# Patient Record
Sex: Female | Born: 1940 | Race: Black or African American | Hispanic: No | Smoking: Former smoker
Health system: Southern US, Community
[De-identification: ages and names within clinical notes are randomized; demographics above are authoritative.]

## PROBLEM LIST (undated history)

## (undated) DIAGNOSIS — M898X9 Other specified disorders of bone, unspecified site: Secondary | ICD-10-CM

## (undated) DIAGNOSIS — Z9889 Other specified postprocedural states: Secondary | ICD-10-CM

## (undated) DIAGNOSIS — R0602 Shortness of breath: Secondary | ICD-10-CM

## (undated) DIAGNOSIS — D631 Anemia in chronic kidney disease: Secondary | ICD-10-CM

## (undated) DIAGNOSIS — K222 Esophageal obstruction: Secondary | ICD-10-CM

## (undated) DIAGNOSIS — I10 Essential (primary) hypertension: Secondary | ICD-10-CM

## (undated) DIAGNOSIS — K219 Gastro-esophageal reflux disease without esophagitis: Secondary | ICD-10-CM

## (undated) DIAGNOSIS — K529 Noninfective gastroenteritis and colitis, unspecified: Secondary | ICD-10-CM

## (undated) DIAGNOSIS — K5792 Diverticulitis of intestine, part unspecified, without perforation or abscess without bleeding: Secondary | ICD-10-CM

## (undated) DIAGNOSIS — M549 Dorsalgia, unspecified: Secondary | ICD-10-CM

## (undated) DIAGNOSIS — G40909 Epilepsy, unspecified, not intractable, without status epilepticus: Secondary | ICD-10-CM

## (undated) DIAGNOSIS — G8929 Other chronic pain: Secondary | ICD-10-CM

## (undated) DIAGNOSIS — E785 Hyperlipidemia, unspecified: Secondary | ICD-10-CM

## (undated) DIAGNOSIS — R55 Syncope and collapse: Secondary | ICD-10-CM

## (undated) DIAGNOSIS — C801 Malignant (primary) neoplasm, unspecified: Secondary | ICD-10-CM

## (undated) DIAGNOSIS — Z9289 Personal history of other medical treatment: Secondary | ICD-10-CM

## (undated) DIAGNOSIS — N039 Chronic nephritic syndrome with unspecified morphologic changes: Secondary | ICD-10-CM

## (undated) HISTORY — DX: Malignant (primary) neoplasm, unspecified: C80.1

## (undated) HISTORY — DX: Syncope and collapse: R55

## (undated) HISTORY — DX: Other specified disorders of bone, unspecified site: M89.8X9

## (undated) HISTORY — DX: Anemia in chronic kidney disease: D63.1

## (undated) HISTORY — DX: Essential (primary) hypertension: I10

## (undated) HISTORY — DX: Chronic nephritic syndrome with unspecified morphologic changes: N03.9

## (undated) HISTORY — DX: Personal history of other medical treatment: Z92.89

## (undated) HISTORY — DX: Gastro-esophageal reflux disease without esophagitis: K21.9

## (undated) HISTORY — PX: ABDOMINAL HYSTERECTOMY: SHX81

## (undated) HISTORY — DX: Epilepsy, unspecified, not intractable, without status epilepticus: G40.909

## (undated) HISTORY — DX: Other chronic pain: G89.29

## (undated) HISTORY — DX: Esophageal obstruction: K22.2

## (undated) HISTORY — DX: Diverticulitis of intestine, part unspecified, without perforation or abscess without bleeding: K57.92

## (undated) HISTORY — DX: Noninfective gastroenteritis and colitis, unspecified: K52.9

## (undated) HISTORY — DX: Dorsalgia, unspecified: M54.9

## (undated) HISTORY — DX: Other specified postprocedural states: Z98.890

## (undated) HISTORY — DX: Hyperlipidemia, unspecified: E78.5

---

## 2006-03-30 ENCOUNTER — Ambulatory Visit: Payer: Self-pay | Admitting: Gastroenterology

## 2006-04-20 ENCOUNTER — Ambulatory Visit: Payer: Self-pay | Admitting: Gastroenterology

## 2007-03-14 ENCOUNTER — Ambulatory Visit: Payer: Self-pay | Admitting: Vascular Surgery

## 2007-03-15 DIAGNOSIS — Z9889 Other specified postprocedural states: Secondary | ICD-10-CM

## 2007-03-15 HISTORY — DX: Other specified postprocedural states: Z98.890

## 2007-03-15 HISTORY — PX: CAROTID ENDARTERECTOMY: SUR193

## 2007-03-22 ENCOUNTER — Ambulatory Visit: Payer: Self-pay | Admitting: Vascular Surgery

## 2007-03-22 ENCOUNTER — Encounter: Payer: Self-pay | Admitting: Vascular Surgery

## 2007-03-22 ENCOUNTER — Inpatient Hospital Stay (HOSPITAL_COMMUNITY): Admission: RE | Admit: 2007-03-22 | Discharge: 2007-03-23 | Payer: Self-pay | Admitting: Vascular Surgery

## 2007-04-04 ENCOUNTER — Ambulatory Visit: Payer: Self-pay | Admitting: Vascular Surgery

## 2007-10-10 ENCOUNTER — Ambulatory Visit: Payer: Self-pay | Admitting: Vascular Surgery

## 2008-04-01 ENCOUNTER — Encounter: Payer: Self-pay | Admitting: Gastroenterology

## 2008-04-23 ENCOUNTER — Encounter: Payer: Self-pay | Admitting: Gastroenterology

## 2008-05-21 ENCOUNTER — Encounter: Payer: Self-pay | Admitting: Gastroenterology

## 2008-06-11 ENCOUNTER — Encounter: Payer: Self-pay | Admitting: Gastroenterology

## 2008-06-14 DIAGNOSIS — C801 Malignant (primary) neoplasm, unspecified: Secondary | ICD-10-CM

## 2008-06-14 HISTORY — PX: OTHER SURGICAL HISTORY: SHX169

## 2008-06-14 HISTORY — DX: Malignant (primary) neoplasm, unspecified: C80.1

## 2008-07-02 ENCOUNTER — Encounter: Payer: Self-pay | Admitting: Gastroenterology

## 2008-07-18 ENCOUNTER — Encounter: Payer: Self-pay | Admitting: Gastroenterology

## 2008-08-13 ENCOUNTER — Encounter: Payer: Self-pay | Admitting: Gastroenterology

## 2008-10-08 ENCOUNTER — Ambulatory Visit: Payer: Self-pay | Admitting: Cardiology

## 2008-10-08 DIAGNOSIS — R0602 Shortness of breath: Secondary | ICD-10-CM

## 2008-10-08 DIAGNOSIS — R011 Cardiac murmur, unspecified: Secondary | ICD-10-CM | POA: Insufficient documentation

## 2008-10-08 DIAGNOSIS — R072 Precordial pain: Secondary | ICD-10-CM | POA: Insufficient documentation

## 2008-10-08 DIAGNOSIS — I10 Essential (primary) hypertension: Secondary | ICD-10-CM | POA: Insufficient documentation

## 2008-10-08 DIAGNOSIS — R55 Syncope and collapse: Secondary | ICD-10-CM

## 2008-10-08 DIAGNOSIS — E785 Hyperlipidemia, unspecified: Secondary | ICD-10-CM

## 2008-10-09 ENCOUNTER — Telehealth: Payer: Self-pay | Admitting: Cardiology

## 2008-10-21 ENCOUNTER — Telehealth (INDEPENDENT_AMBULATORY_CARE_PROVIDER_SITE_OTHER): Payer: Self-pay | Admitting: Radiology

## 2008-10-22 ENCOUNTER — Ambulatory Visit: Payer: Self-pay

## 2008-10-22 ENCOUNTER — Encounter (INDEPENDENT_AMBULATORY_CARE_PROVIDER_SITE_OTHER): Payer: Self-pay | Admitting: General Surgery

## 2008-10-22 ENCOUNTER — Encounter: Payer: Self-pay | Admitting: Cardiology

## 2008-10-24 ENCOUNTER — Encounter: Payer: Self-pay | Admitting: Gastroenterology

## 2008-10-24 ENCOUNTER — Encounter: Payer: Self-pay | Admitting: Cardiology

## 2008-10-28 ENCOUNTER — Ambulatory Visit: Payer: Self-pay | Admitting: Cardiology

## 2008-10-28 DIAGNOSIS — R42 Dizziness and giddiness: Secondary | ICD-10-CM

## 2008-11-12 ENCOUNTER — Telehealth: Payer: Self-pay | Admitting: Cardiology

## 2008-11-15 ENCOUNTER — Telehealth: Payer: Self-pay | Admitting: Cardiology

## 2008-11-15 ENCOUNTER — Encounter: Payer: Self-pay | Admitting: Cardiology

## 2008-11-18 ENCOUNTER — Encounter: Payer: Self-pay | Admitting: Cardiology

## 2008-11-18 ENCOUNTER — Telehealth: Payer: Self-pay | Admitting: Cardiology

## 2008-11-19 ENCOUNTER — Telehealth (INDEPENDENT_AMBULATORY_CARE_PROVIDER_SITE_OTHER): Payer: Self-pay | Admitting: *Deleted

## 2008-11-20 ENCOUNTER — Encounter: Payer: Self-pay | Admitting: Cardiology

## 2008-11-21 ENCOUNTER — Encounter: Payer: Self-pay | Admitting: Gastroenterology

## 2008-11-28 ENCOUNTER — Ambulatory Visit: Payer: Self-pay | Admitting: Cardiology

## 2008-12-17 ENCOUNTER — Ambulatory Visit: Payer: Self-pay | Admitting: Cardiology

## 2008-12-18 LAB — CONVERTED CEMR LAB
ALT: 46 units/L — ABNORMAL HIGH (ref 0–35)
Cholesterol: 160 mg/dL (ref 0–200)
HDL: 51.7 mg/dL (ref >39.00)
Total Protein: 6.7 g/dL (ref 6.0–8.3)
VLDL: 21 mg/dL (ref 0.0–40.0)

## 2008-12-19 ENCOUNTER — Encounter: Payer: Self-pay | Admitting: Gastroenterology

## 2009-01-12 DIAGNOSIS — Z9289 Personal history of other medical treatment: Secondary | ICD-10-CM

## 2009-01-12 HISTORY — DX: Personal history of other medical treatment: Z92.89

## 2009-01-28 ENCOUNTER — Ambulatory Visit: Payer: Self-pay | Admitting: Cardiology

## 2009-01-28 DIAGNOSIS — E639 Nutritional deficiency, unspecified: Secondary | ICD-10-CM | POA: Insufficient documentation

## 2009-01-28 DIAGNOSIS — I43 Cardiomyopathy in diseases classified elsewhere: Secondary | ICD-10-CM

## 2009-01-28 DIAGNOSIS — E889 Metabolic disorder, unspecified: Secondary | ICD-10-CM

## 2009-01-28 LAB — CONVERTED CEMR LAB
Alpha-2-Globulin: 12.1 % — ABNORMAL HIGH (ref 7.1–11.8)
Beta Globulin: 5.9 % (ref 4.7–7.2)
Beta, Urine: DETECTED % — AB
Calcium: 9.8 mg/dL (ref 8.4–10.5)
Creatinine, Ser: 1 mg/dL (ref 0.4–1.2)
Free Kappa Lt Chains,Ur: 2.09 mg/dL — ABNORMAL HIGH (ref 0.04–1.51)
Free Lambda Lt Chains,Ur: 19.9 mg/dL — ABNORMAL HIGH (ref 0.08–1.01)
GFR calc non Af Amer: 71.01 mL/min (ref >60)
Gamma Globulin, Urine: DETECTED % — AB
Gamma Globulin: 9.9 % — ABNORMAL LOW (ref 11.1–18.8)
Glucose, Bld: 86 mg/dL (ref 70–99)
IgG (Immunoglobin G), Serum: 841 mg/dL (ref 694–1618)
Pro B Natriuretic peptide (BNP): 119 pg/mL — ABNORMAL HIGH (ref 0.0–100.0)
Sodium: 143 meq/L (ref 135–145)
Total Protein, Serum Electrophoresis: 7.4 g/dL (ref 6.0–8.3)

## 2009-01-29 ENCOUNTER — Ambulatory Visit: Payer: Self-pay | Admitting: Cardiology

## 2009-01-29 ENCOUNTER — Ambulatory Visit: Payer: Self-pay | Admitting: Internal Medicine

## 2009-01-29 ENCOUNTER — Ambulatory Visit (HOSPITAL_COMMUNITY): Admission: RE | Admit: 2009-01-29 | Discharge: 2009-01-29 | Payer: Self-pay | Admitting: Cardiology

## 2009-01-30 ENCOUNTER — Encounter: Payer: Self-pay | Admitting: Gastroenterology

## 2009-01-31 ENCOUNTER — Ambulatory Visit: Payer: Self-pay | Admitting: Internal Medicine

## 2009-01-31 ENCOUNTER — Ambulatory Visit (HOSPITAL_COMMUNITY): Admission: RE | Admit: 2009-01-31 | Discharge: 2009-01-31 | Payer: Self-pay | Admitting: Internal Medicine

## 2009-02-01 LAB — CONVERTED CEMR LAB
Basophils Absolute: 0 10*3/uL (ref 0.0–0.1)
Hemoglobin: 10.2 g/dL — ABNORMAL LOW (ref 12.0–15.0)
INR: 1.2 — ABNORMAL HIGH (ref 0.8–1.0)
Lymphocytes Relative: 27.5 % (ref 12.0–46.0)
Monocytes Relative: 5.9 % (ref 3.0–12.0)
Neutro Abs: 3.8 10*3/uL (ref 1.4–7.7)
Neutrophils Relative %: 65.1 % (ref 43.0–77.0)
Prothrombin Time: 12 s — ABNORMAL HIGH (ref 9.1–11.7)
RBC: 3.79 M/uL — ABNORMAL LOW (ref 3.87–5.11)
RDW: 16 % — ABNORMAL HIGH (ref 11.5–14.6)
aPTT: 25.8 s (ref 21.7–28.8)

## 2009-02-03 ENCOUNTER — Telehealth: Payer: Self-pay | Admitting: Cardiology

## 2009-02-05 ENCOUNTER — Ambulatory Visit: Payer: Self-pay | Admitting: Internal Medicine

## 2009-02-05 DIAGNOSIS — I428 Other cardiomyopathies: Secondary | ICD-10-CM

## 2009-02-06 ENCOUNTER — Encounter: Payer: Self-pay | Admitting: Cardiology

## 2009-02-11 ENCOUNTER — Ambulatory Visit (HOSPITAL_COMMUNITY): Admission: RE | Admit: 2009-02-11 | Discharge: 2009-02-11 | Payer: Self-pay | Admitting: Cardiology

## 2009-02-11 ENCOUNTER — Ambulatory Visit: Payer: Self-pay

## 2009-02-11 ENCOUNTER — Encounter: Payer: Self-pay | Admitting: Internal Medicine

## 2009-02-12 DIAGNOSIS — D631 Anemia in chronic kidney disease: Secondary | ICD-10-CM

## 2009-02-12 HISTORY — DX: Anemia in chronic kidney disease: D63.1

## 2009-02-13 ENCOUNTER — Ambulatory Visit: Payer: Self-pay | Admitting: Internal Medicine

## 2009-02-13 ENCOUNTER — Inpatient Hospital Stay (HOSPITAL_COMMUNITY): Admission: EM | Admit: 2009-02-13 | Discharge: 2009-02-17 | Payer: Self-pay | Admitting: Internal Medicine

## 2009-02-13 ENCOUNTER — Ambulatory Visit: Payer: Self-pay | Admitting: Cardiology

## 2009-02-18 ENCOUNTER — Encounter: Payer: Self-pay | Admitting: Cardiology

## 2009-02-24 ENCOUNTER — Ambulatory Visit: Payer: Self-pay | Admitting: Cardiology

## 2009-02-25 ENCOUNTER — Encounter: Payer: Self-pay | Admitting: Cardiology

## 2009-03-11 ENCOUNTER — Encounter: Payer: Self-pay | Admitting: Cardiology

## 2009-03-14 HISTORY — PX: COLONOSCOPY: SHX174

## 2009-03-24 ENCOUNTER — Encounter: Payer: Self-pay | Admitting: Cardiology

## 2009-04-24 ENCOUNTER — Encounter: Payer: Self-pay | Admitting: Cardiology

## 2009-04-28 ENCOUNTER — Ambulatory Visit: Payer: Self-pay | Admitting: Cardiology

## 2009-06-14 DIAGNOSIS — K5792 Diverticulitis of intestine, part unspecified, without perforation or abscess without bleeding: Secondary | ICD-10-CM

## 2009-06-14 HISTORY — PX: PARTIAL COLECTOMY: SHX5273

## 2009-06-14 HISTORY — DX: Diverticulitis of intestine, part unspecified, without perforation or abscess without bleeding: K57.92

## 2009-06-25 ENCOUNTER — Telehealth: Payer: Self-pay | Admitting: Cardiology

## 2009-07-12 ENCOUNTER — Ambulatory Visit: Payer: Self-pay | Admitting: Cardiology

## 2009-08-25 ENCOUNTER — Encounter: Payer: Self-pay | Admitting: Cardiology

## 2009-09-08 ENCOUNTER — Ambulatory Visit: Payer: Self-pay

## 2009-09-08 ENCOUNTER — Ambulatory Visit: Payer: Self-pay | Admitting: Cardiology

## 2009-09-08 ENCOUNTER — Encounter: Payer: Self-pay | Admitting: Internal Medicine

## 2009-09-08 DIAGNOSIS — R609 Edema, unspecified: Secondary | ICD-10-CM

## 2009-09-10 ENCOUNTER — Encounter: Payer: Self-pay | Admitting: Internal Medicine

## 2009-09-10 ENCOUNTER — Telehealth (INDEPENDENT_AMBULATORY_CARE_PROVIDER_SITE_OTHER): Payer: Self-pay | Admitting: *Deleted

## 2009-09-10 ENCOUNTER — Encounter: Payer: Self-pay | Admitting: Cardiology

## 2009-09-11 ENCOUNTER — Telehealth: Payer: Self-pay | Admitting: Cardiology

## 2009-09-19 ENCOUNTER — Ambulatory Visit: Payer: Self-pay

## 2009-09-19 ENCOUNTER — Ambulatory Visit: Payer: Self-pay | Admitting: Internal Medicine

## 2009-09-19 ENCOUNTER — Ambulatory Visit (HOSPITAL_COMMUNITY): Admission: RE | Admit: 2009-09-19 | Discharge: 2009-09-19 | Payer: Self-pay | Admitting: Cardiology

## 2009-09-19 ENCOUNTER — Encounter: Payer: Self-pay | Admitting: Cardiology

## 2009-09-19 ENCOUNTER — Ambulatory Visit: Payer: Self-pay | Admitting: Cardiology

## 2009-09-19 ENCOUNTER — Encounter (INDEPENDENT_AMBULATORY_CARE_PROVIDER_SITE_OTHER): Payer: Self-pay | Admitting: *Deleted

## 2009-09-22 LAB — CONVERTED CEMR LAB
AST: 19 units/L (ref 0–37)
Albumin: 3.7 g/dL (ref 3.5–5.2)
Alkaline Phosphatase: 87 units/L (ref 39–117)
BUN: 17 mg/dL (ref 6–23)
Basophils Relative: 0.4 % (ref 0.0–3.0)
Bilirubin, Direct: 0.1 mg/dL (ref 0.0–0.3)
Calcium: 8.5 mg/dL (ref 8.4–10.5)
Chloride: 106 meq/L (ref 96–112)
Cholesterol: 211 mg/dL — ABNORMAL HIGH (ref 0–200)
Creatinine, Ser: 0.8 mg/dL (ref 0.4–1.2)
Eosinophils Absolute: 0 10*3/uL (ref 0.0–0.7)
Eosinophils Relative: 0.5 % (ref 0.0–5.0)
INR: 1.1 — ABNORMAL HIGH (ref 0.8–1.0)
Lymphocytes Relative: 26.4 % (ref 12.0–46.0)
MCHC: 33.2 g/dL (ref 30.0–36.0)
MCV: 83 fL (ref 78.0–100.0)
Monocytes Absolute: 0.5 10*3/uL (ref 0.1–1.0)
Neutrophils Relative %: 63.4 % (ref 43.0–77.0)
Platelets: 251 10*3/uL (ref 150.0–400.0)
Prothrombin Time: 11.6 s (ref 9.1–11.7)
RBC: 4.88 M/uL (ref 3.87–5.11)
Total Protein: 6.1 g/dL (ref 6.0–8.3)
WBC: 5.8 10*3/uL (ref 4.5–10.5)
aPTT: 22.3 s (ref 21.7–28.8)

## 2009-09-25 ENCOUNTER — Encounter: Payer: Self-pay | Admitting: Cardiology

## 2009-10-21 ENCOUNTER — Ambulatory Visit: Payer: Self-pay | Admitting: Cardiology

## 2009-10-21 LAB — CONVERTED CEMR LAB
BUN: 11 mg/dL (ref 6–23)
Calcium: 9.2 mg/dL (ref 8.4–10.5)
Creatinine, Ser: 0.7 mg/dL (ref 0.4–1.2)
GFR calc non Af Amer: 112.48 mL/min (ref >60)
Glucose, Bld: 37 mg/dL — CL (ref 70–99)

## 2009-12-18 ENCOUNTER — Ambulatory Visit: Payer: Self-pay | Admitting: Cardiology

## 2009-12-20 ENCOUNTER — Inpatient Hospital Stay (HOSPITAL_COMMUNITY): Admission: EM | Admit: 2009-12-20 | Discharge: 2010-01-02 | Payer: Self-pay | Admitting: Pulmonary Disease

## 2009-12-20 ENCOUNTER — Ambulatory Visit: Payer: Self-pay | Admitting: Pulmonary Disease

## 2009-12-31 ENCOUNTER — Ambulatory Visit: Payer: Self-pay | Admitting: Physical Medicine & Rehabilitation

## 2010-01-02 ENCOUNTER — Inpatient Hospital Stay (HOSPITAL_COMMUNITY)
Admission: RE | Admit: 2010-01-02 | Discharge: 2010-01-19 | Payer: Self-pay | Admitting: Physical Medicine & Rehabilitation

## 2010-01-10 ENCOUNTER — Ambulatory Visit: Payer: Self-pay | Admitting: Physical Medicine & Rehabilitation

## 2010-01-27 ENCOUNTER — Telehealth: Payer: Self-pay | Admitting: Cardiology

## 2010-02-10 ENCOUNTER — Inpatient Hospital Stay (HOSPITAL_COMMUNITY): Admission: EM | Admit: 2010-02-10 | Discharge: 2010-02-19 | Payer: Self-pay | Admitting: Emergency Medicine

## 2010-02-13 ENCOUNTER — Ambulatory Visit: Payer: Self-pay | Admitting: Internal Medicine

## 2010-02-27 ENCOUNTER — Encounter (INDEPENDENT_AMBULATORY_CARE_PROVIDER_SITE_OTHER): Payer: Self-pay | Admitting: *Deleted

## 2010-03-28 ENCOUNTER — Emergency Department (HOSPITAL_COMMUNITY): Admission: EM | Admit: 2010-03-28 | Discharge: 2010-03-28 | Payer: Self-pay | Admitting: Emergency Medicine

## 2010-04-19 ENCOUNTER — Emergency Department (HOSPITAL_COMMUNITY): Admission: EM | Admit: 2010-04-19 | Discharge: 2010-04-19 | Payer: Self-pay | Admitting: Emergency Medicine

## 2010-04-27 ENCOUNTER — Encounter: Payer: Self-pay | Admitting: Cardiology

## 2010-05-04 ENCOUNTER — Ambulatory Visit: Payer: Self-pay | Admitting: Internal Medicine

## 2010-05-14 DIAGNOSIS — K222 Esophageal obstruction: Secondary | ICD-10-CM

## 2010-05-14 HISTORY — DX: Esophageal obstruction: K22.2

## 2010-05-20 ENCOUNTER — Ambulatory Visit (HOSPITAL_COMMUNITY)
Admission: RE | Admit: 2010-05-20 | Discharge: 2010-05-20 | Payer: Self-pay | Source: Home / Self Care | Attending: Internal Medicine | Admitting: Internal Medicine

## 2010-05-20 ENCOUNTER — Ambulatory Visit: Payer: Self-pay | Admitting: Internal Medicine

## 2010-05-20 HISTORY — PX: UPPER GASTROINTESTINAL ENDOSCOPY: SHX188

## 2010-06-10 ENCOUNTER — Encounter: Payer: Self-pay | Admitting: Cardiology

## 2010-06-29 ENCOUNTER — Ambulatory Visit
Admission: RE | Admit: 2010-06-29 | Discharge: 2010-06-29 | Payer: Self-pay | Source: Home / Self Care | Attending: Cardiology | Admitting: Cardiology

## 2010-06-29 ENCOUNTER — Encounter: Payer: Self-pay | Admitting: Cardiology

## 2010-06-29 ENCOUNTER — Other Ambulatory Visit: Payer: Self-pay | Admitting: Cardiology

## 2010-06-29 LAB — BASIC METABOLIC PANEL
BUN: 14 mg/dL (ref 6–23)
CO2: 27 mEq/L (ref 19–32)
Calcium: 9.5 mg/dL (ref 8.4–10.5)
Chloride: 100 mEq/L (ref 96–112)
Creatinine, Ser: 0.9 mg/dL (ref 0.4–1.2)
GFR: 76.89 mL/min (ref >60.00)
Glucose, Bld: 108 mg/dL — ABNORMAL HIGH (ref 70–99)
Potassium: 4.5 mEq/L (ref 3.5–5.1)
Sodium: 136 mEq/L (ref 135–145)

## 2010-06-29 LAB — LIPID PANEL
Cholesterol: 267 mg/dL — ABNORMAL HIGH (ref 0–200)
HDL: 54.6 mg/dL (ref >39.00)
Total CHOL/HDL Ratio: 5
Triglycerides: 257 mg/dL — ABNORMAL HIGH (ref 0.0–149.0)
VLDL: 51.4 mg/dL — ABNORMAL HIGH (ref 0.0–40.0)

## 2010-06-29 LAB — HEPATIC FUNCTION PANEL
ALT: 13 U/L (ref 0–35)
AST: 21 U/L (ref 0–37)
Albumin: 4 g/dL (ref 3.5–5.2)
Alkaline Phosphatase: 130 U/L — ABNORMAL HIGH (ref 39–117)
Bilirubin, Direct: 0.1 mg/dL (ref 0.0–0.3)
Total Bilirubin: 0.7 mg/dL (ref 0.3–1.2)
Total Protein: 7.3 g/dL (ref 6.0–8.3)

## 2010-06-29 LAB — LDL CHOLESTEROL, DIRECT: Direct LDL: 155.9 mg/dL

## 2010-06-29 LAB — BRAIN NATRIURETIC PEPTIDE: Pro B Natriuretic peptide (BNP): 47.5 pg/mL (ref 0.0–100.0)

## 2010-07-09 ENCOUNTER — Telehealth: Payer: Self-pay | Admitting: Cardiology

## 2010-07-14 NOTE — Letter (Signed)
Summary: [employer] - BP Report  [employer] - BP Report   Imported By: Marylou Mccoy 11/13/2009 10:55:51  _____________________________________________________________________  External Attachment:    Type:   Image     Comment:   External Document

## 2010-07-14 NOTE — Progress Notes (Signed)
  Walk in Patient Form Recieved " pt left Labs from [facility]' for [facility] to review forwarded to [facility]  September 10, 2009 10:48 AM

## 2010-07-14 NOTE — Letter (Signed)
Summary: Appointment - Missed  [facility], Main Office  [address]   [place], [place] [address]   Phone: [phone]  Fax: [phone]     February 27, 2010 MRN: [id number]   SHAWN DANNENBERG [address] [place], [place]  [address]   Dear Ms. Morales,  Our records indicate you missed your appointment on  02/23/10 with Dr. Shirlee Latch .It is very important that we reach you to reschedule this appointment. We look forward to participating in your health care needs. Please contact us at the number listed above at your earliest convenience to reschedule this appointment.     Sincerely,  Artist

## 2010-07-14 NOTE — Assessment & Plan Note (Signed)
Summary: 6wk f/u sl   Referring Provider:  Marca Ancona, MD Primary Provider:  Paulene Floor  CC:  6 week follow up.  Pt feeling well.  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  She underwent treatment with Velcade and dexamethasone for systemic amyloidosis.  Currently she is only getting dexamethasone.  Per the patient, her light chain count has been decreasing with treatment.  She went into [facility] on 07/12/09 with what sounds like diverticulitis and a resulting perforation.  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  She was in rehab for a while but is now at home.  She also had a bout with severe diarrhea.  She was seen by GI, and it was determined that she had secretory diarrhea.  She was started on octreotide with much improvement.  Repeat echo in 4/11 showed severe LV hypertrophy with stable LV mid-cavity gradient with valsalva.    Patient has been doing better recently.  She has had no falls/syncope and has minimal lightheadedness.  She stands up slowly to minimize orthostasis.  BP is more stable, running 120s-130s systolic at home.  She says that there are plans to reverse her colostomy in the near future.  No chest pain.  No dyspnea when walking on flat ground.    Labs (7/10): LDL 87, HDL 52, ALT 46, AST 33 Labs (8/10): K 4.4, creatinine 1.0, BNP 119, HCT 30.6, SPEP negative, UPEP positive for monoclonal lambda light chains.  Labs (9/10): creatinine 0.93, C diff negative  Labs (4/11): HDL 58, LDL 127, K 3.5, creatinine 0.8  Current Medications (verified): 1)  Folic Acid 1 Mg Tabs (Folic Acid) .... Take 1 Tablet Once Daily 2)  Zocor 80 Mg Tabs (Simvastatin) .... Take One Tablet Once Daily 3)  Toprol Xl 50 Mg Xr24h-Tab (Metoprolol Succinate) .... 1/2 Daily 4)  Vitamin D (Ergocalciferol) 50000 Unit Caps (Ergocalciferol) .... Every Week 5)  Hydroxyzine Hcl 10 Mg Tabs (Hydroxyzine  Hcl) .... Take 1 Tablet By Mouth Two Times A Day 6)  Lisinopril 10 Mg Tabs (Lisinopril) .Marland Kitchen.. 1 Tablet Once Daily 7)  Prochlorperazine Maleate 10 Mg Tabs (Prochlorperazine Maleate) .... Take One Tablet Before Meals 8)  Dexamethasone 4 Mg Tabs (Dexamethasone) .... Take 10 Tabs Every Tuesday.  5 Tabs Am, 5 Tabs Pm 9)  Aspirin 81 Mg Tbec (Aspirin) .... Take One Tablet By Mouth Daily 10)  Octreotide Acetate 1000 Mcg/ml Soln (Octreotide Acetate) .... Use Every 12 Hours  Allergies (verified): No Known Drug Allergies  Past History:  Past Medical History: 1.  Left CEA (10/08).  Most recent carotid dopplers 6/10 with 50-69% RICA stenosis, LICA ok s/p CEA.  2.  HTN 3.  GERD 4.  Hyperlipidemia 5.  Anemia:  She has been on Aranesp for ? anemia of chronic kidney disease though renal function does not seem severely impaired.  She had repeat bone marrow biopsy by Dr. Cleone Slim 9/10. 6.  s/p hysterectomy 7.  Mid-cavity dynamic LV gradient:  Echo (5/10) with EF 65-70%, moderate LVH, mid-cavity obliteration, peak gradient to 54 mmHg with Valsalva.  Repeat echo (4/11) with EF 65-70%, severe concentric LVH, peak LV mid-cavity gradient reaching 45 mmHg with valsalva, grade I diastolic dysfunction, RV hypertrophy, small pericardial effusion. 8.  Adenosine myoview (5/10):  No evidence for ischemia or infarction.  9.  Episodes of syncope and presyncope.  - Patient has Medtronic loop recorder implanted 8/10.  No events so  far - May be due only to orthostasis 10. 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   - Cardiac MRI (8/10): EF 66%, mild to moderate concentric LVH, RV size and systolic function normal with no evidence for ARVC, trivial pericardial effusion.  On delayed enhancement, it was difficult to obtain the TI.  There was patchy mid-wall basal delayed enhancement.  There was partially circumferential subendocardial delayed enhancement in the mid to apical LV.  This was  suggestive of infiltrative cardiomyopathy versus possible HCM.  - SPEP negative.  UPEP positive for monoclonal lambda light chains.  Monoclonal light chains confirmed on testing by Dr. Cleone Slim.  - CT chest (8/10) to assess for sarcoidosis: no evidence by CT for pulmonary sarcoid.  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  11.  C. difficile colitis with orthostatic hypotension 12. Diverticulitis (1/11) with perforation and colostomy.  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   Family History: Reviewed history from 02/05/2009 and no changes required. Mother died suddenly with presumed MI at 86.  Father died suddenly with presumed MI at age 6.  Cousin died suddenly, ? age.   Social History: Reviewed history from 09/08/2009 and no changes required. Quit tobacco in 1980.  No ETOH or drugs.  Single, lives in [place], 1 child (daughter in [place]).  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   Review of Systems       All systems reviewed and negative except as per HPI.   Vital Signs:  Patient profile:   70 year old female Height:      63 inches Weight:      99 pounds BMI:     17.60 Pulse rate:   73 / minute Pulse rhythm:   regular BP sitting:   116 / 74  (left arm) Cuff size:   regular  Vitals Entered By: Judithe Modest CMA (Oct 21, 2009 10:41 AM)  Physical Exam  General:  Thin, no apparent distress Neck:  Neck supple, no JVD. No masses, thyromegaly or abnormal cervical nodes. Lungs:  Clear bilaterally to auscultation and percussion. Heart:  Non-displaced PMI, chest non-tender; regular rate and rhythm, S1, S2, 2/6 systolic crescendo-decrescendo early systolic murmur upper sternal border. No S3/S4.  Carotid upstroke normal, no bruit. Pedals normal pulses. 1+ left ankle edema.  Abdomen:  Bowel sounds positive; abdomen soft and non-tender without masses, organomegaly, or hernias noted. No hepatosplenomegaly. Extremities:  No clubbing or  cyanosis. Neurologic:  Alert and oriented x 3. Psych:  Normal affect.    ILR Following MD Hillis Range, MD DOI:  01/31/2009 Vendor:  [employer]tronic     Model Number:  0454     Serial Number [id number]        Impression & Recommendations:  Problem # 1:  CARDIOMYOPATHY (ICD-425.4) Patient has a cardiomyopathy with preserved LV systolic function.  Delayed enhancement on MRI suggested an infiltrative disease or hypertrophic cardiomyopathy rather than coronary disease, and she has had a negative myoview.  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  This was confirmed by testing at Dr. Bertha Stakes office as well.  CT chest was not suggestive of pulmonary sarcoidosis. Given the finding of monoclonal light chains and suggestive MRI findings, she is now being treated for amyloidosis by Dr. Cleone Slim.  She was on Velcade and dexamethasone and is now on dexamethasone alone. Recent echo showed stable LV mid-cavity gradient.  She seems to be  doing better in general.  - She does not appear significantly volume overloaded today.  No Lasix as she does not appear overloaded and this could worsen her mid-cavity gradient and orthostasis.  - Continue treatment for light chain amyloidosis per Dr. Cleone Slim.  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  Mid-cavity gradient on echo certainly would predispose to this.  She has had her loop recorder since 8/10 and has had no arrhythmic events despite multiple presyncopal episodes (same as the symptoms that precipitated placement of the loop recorder).  Symptoms are considerably improved with less orthostasis over the last few months.  We discussed removing the loop recorder, but given minimal risk will leave in place for now.   - Continue Toprol XL (may improve mid-cavity gradient) - Always stand up slowly while holding  onto something.  - Continue to wear graded compression stockings.    Problem # 3:  HYPERTENSION, UNSPECIFIED (ICD-401.9) BP is well-controlled. Continue Toprol XL and lisinopril.   Problem # 4:  HYPERLIPIDEMIA-MIXED (ICD-272.4) LDL is too high given known vascular disease.  Goal LDL < 70, will increase Crestor to 20 mg daily.   Patient Instructions: 1)  Your physician has recommended you make the following change in your medication:  2)  Stop Zocor(simvastatin) 3)  Start Crestor 20mg  daily 4)  Lab today--BMP  425.4 401.9 5)  Your physician recommends that you have a  FASTING lipid profile/liver profile/BMP in 2 months--you have the order-please fax results to [phone]  6)  Your physician recommends that you schedule a follow-up appointment in: 4 months with Dr Shirlee Latch. Prescriptions: CRESTOR 20 MG TABS (ROSUVASTATIN CALCIUM) one tablet daily  #30 x 6   Entered by:   Katina Dung, RN, BSN   Authorized by:   Marca Ancona, MD   Signed by:   Katina Dung, RN, BSN on 10/21/2009   Method used:   Electronically to        [facility] [phone]* (retail)       [address] [place], [place]  [address]       Ph: [phone] or [phone]       Fax: [phone]   RxID:   [id number]

## 2010-07-14 NOTE — Cardiovascular Report (Signed)
Summary: Office Visit   Office Visit   Imported By: Roderic Ovens 09/19/2009 13:27:47  _____________________________________________________________________  External Attachment:    Type:   Image     Comment:   External Document

## 2010-07-14 NOTE — Letter (Signed)
Summary: External Correspondence/ [facility]  External Correspondence/ [facility]   Imported By: Dorise Hiss 10/07/2009 09:32:36  _____________________________________________________________________  External Attachment:    Type:   Image     Comment:   External Document

## 2010-07-14 NOTE — Assessment & Plan Note (Signed)
Summary: 4 month rov/sl   Referring Provider:  Marca Ancona, MD Primary Provider:  Paulene Floor  CC:  sob.  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  She underwent treatment with Velcade and dexamethasone for systemic amyloidosis.  This has been complicated by probable treatment-related diarrhea.  Currently she is only getting dexamethasone.  Per the patient, her light chain count has been decreasing with treatment.  She has had a very difficult last few months.  She went into [facility] on 07/12/09 with what sounds like diverticulitis and a resulting perforation.  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  She was in rehab for a while but is now at home.  Weight is down 8 lbs since I last saw her.  She has continued to have a lot of fluctuations with her BP.  It has been as high as 198/96 but also has been very low (down to the 80s systolic).  Two weeks ago, she got lightheaded getting out of her car and fell.  She almost passed out but never completely lost consciousness.  She has been wearing her support hose. She is walking with a cane and does not have much exertional shortness of breath on flat ground.  She gets short of breath with a flight of steps.    We did orthostatics today in the office.  Her BP initially dropped significantly with standing but rose quickly back to baseline after a couple of minutes standing.   Interrogation of her loop recorder today showed no significant arrhythmic events.   Labs (7/10): LDL 87, HDL 52, ALT 46, AST 33 Labs (8/10): K 4.4, creatinine 1.0, BNP 119, HCT 30.6, SPEP negative, UPEP positive for monoclonal lambda light chains.  Labs (9/10): creatinine 0.93, C diff negative   Loop recorder interrogation: No significant events.  Nothing to correlate with the presyncopal episode 2 weeks ago.   Current Medications (verified): 1)  Folic Acid 1 Mg Tabs  (Folic Acid) .... Take 1 Tablet Once Daily 2)  Zocor 80 Mg Tabs (Simvastatin) .... Take One Tablet Once Daily 3)  Toprol Xl 50 Mg Xr24h-Tab (Metoprolol Succinate) .... 1/2 Daily 4)  Vitamin D (Ergocalciferol) 50000 Unit Caps (Ergocalciferol) .... Every Week 5)  Hydroxyzine Hcl 10 Mg Tabs (Hydroxyzine Hcl) .... Take 1 Tablet By Mouth Two Times A Day 6)  Lisinopril 10 Mg Tabs (Lisinopril) .... 1/2 Daily 7)  Prochlorperazine Maleate 10 Mg Tabs (Prochlorperazine Maleate) .... Take One Tablet Before Meals 8)  Dexamethasone 4 Mg Tabs (Dexamethasone) .... Take 10 Tabs Every Tuesday.  5 Tabs Am, 5 Tabs Pm  Allergies: No Known Drug Allergies  Past History:  Past Medical History: 1.  Left CEA (10/08).  Most recent carotid dopplers 6/10 with 50-69% RICA stenosis, LICA ok s/p CEA.  2.  HTN 3.  GERD 4.  Hyperlipidemia 5.  Anemia:  Pt has been worked up with colonoscopy and EGD.  Per the pt's report, the only thing found was diverticulosis.  She had a bone marrow biopsy in fall 2009 that per Dr. Bertha Stakes notes showed essentially normal marrow.  She has been on Aranesp for ? anemia of chronic kidney disease though renal function does not seem severely impaired.  She had repeat bone marrow biopsy by Dr. Cleone Slim 9/10. 6.  s/p hysterectomy 7.  Mid-cavity dynamic LV gradient:  Echo (5/10) with EF 65-70%, moderate LVH, mid-cavity obliteration, peak gradient to 54 mmHg  with Valsalva.  8.  Adenosine myoview (5/10):  No evidence for ischemia or infarction.  9.  Episodes of syncope and presyncope.  - Patient has Medtronic loop recorder implanted 8/10.  No events so far - May be due only to orthostasis 10. 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   - Cardiac MRI (8/10): EF 66%, mild to moderate concentric LVH, RV size and systolic function normal with no evidence for ARVC, trivial pericardial effusion.  On delayed enhancement, it was difficult to obtain the TI.  There was patchy  mid-wall basal delayed enhancement.  There was partially circumferential subendocardial delayed enhancement in the mid to apical LV.  This was suggestive of infiltrative cardiomyopathy versus possible HCM.  - SPEP negative.  UPEP positive for monoclonal lambda light chains.  Monoclonal light chains confirmed on testing by Dr. Cleone Slim.  - CT chest (8/10) to assess for sarcoidosis: no evidence by CT for pulmonary sarcoid.  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  11.  C. difficile colitis with orthostatic hypotension 12. Diverticulitis (1/11) with perforation and colostomy.   Family History: Reviewed history from 02/05/2009 and no changes required. Mother died suddenly with presumed MI at 18.  Father died suddenly with presumed MI at age 54.  Cousin died suddenly, ? age.   Social History: Quit tobacco in 1980.  No ETOH or drugs.  Single, lives in [place], 1 child (daughter in [place]).  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   Review of Systems       All systems reviewed and negative except as per HPI.   Vital Signs:  Patient profile:   70 year old female Height:      63 inches Weight:      96 pounds BMI:     17.07 Pulse rate:   72 / minute Pulse (ortho):   72 / minute Resp:     18 per minute BP sitting:   160 / 80  (left arm) BP standing:   148 / 81  Vitals Entered By: Kem Parkinson (September 08, 2009 1:57 PM)  Serial Vital Signs/Assessments:  Time      Position  BP       Pulse  Resp  Temp     By           Lying RA  160/77   71                    Kimalexis Barnes           Sitting   131/70   71                    Kimalexis Barnes           Standing  148/81   72                    Kimalexis Barnes   Physical Exam  General:  Thin, frail-appearing Neck:  Neck supple, no JVD. No masses, thyromegaly or abnormal cervical nodes. Lungs:  Clear bilaterally to auscultation and percussion. Heart:  Non-displaced PMI, chest non-tender; regular  rate and rhythm, S1, S2, 3/6 systolic crescendo-decrescendo early systolic murmur upper sternal border, more holosystolic lower sternal border. No S3/S4.  Carotid upstroke normal, no bruit. Pedals normal pulses. 2+ edema 1/2 up lower leg on left, 1+ ankle edema on right (asymmetric).  Abdomen:  Bowel sounds positive; abdomen soft and non-tender without  masses, organomegaly, or hernias noted. No hepatosplenomegaly. Extremities:  No clubbing or cyanosis. Neurologic:  Alert and oriented x 3. Psych:  Normal affect.    ILR Following MD Hillis Range, MD DOI:  01/31/2009 Vendor:  Medtronic     Model Number:  [id number]     Serial Number [id number]        Impression & Recommendations:  Problem # 1:  CARDIOMYOPATHY (ICD-425.4) Patient has a cardiomyopathy with preserved LV systolic function.  Delayed enhancement on MRI suggested an infiltrative disease or hypertrophic cardiomyopathy rather than coronary disease, and she has had a negative myoview.  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  This was confirmed by testing at Dr. Bertha Stakes office as well.  CT chest was not suggestive of pulmonary sarcoidosis.  She does not appear significantly volume overloaded today.  No Lasix as she does not appear overloaded and this could worsen her mid-cavity gradient and orthostasis.  Given the finding of monoclonal light chains and suggestive MRI findings, she is now being treated for amyloidosis by Dr. Cleone Slim.  She was on Velcade and dexamethasone and is now on dexamethasone alone.  - Continue treatment for light chain amyloidosis per Dr. Cleone Slim. - Will continue Toprol XL at current dose, this may help with mid-cavity gradient and orthostasis.  - Repeat echocardiogram - Check BNP with labs.   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  Mid-cavity gradient  on echo certainly would predispose to this.  She has had her loop recorder since 8/10 and has had no arrhythmic events despite multiple presyncopal episodes (same as the symptoms that precipitated placement of the loop recorder).  Therefore, I think that it would be reasonable to remove the loop recorder as I do not think that her episodes are arrhythmic.  Orthostatic BP measurement today showed initial drop in BP with standing but after a couple of minutes BP rose back up to the same level as when she was lying down.  - Continue Toprol XL (may improve mid-cavity gradient) - Discuss removal of loop recorder with Dr. Johney Frame - Always stand up slowly while holding onto something.  - Continue to wear graded compression stockings.    Problem # 3:  HYPERTENSION, UNSPECIFIED (ICD-401.9) BP gets quite high at times but seems to fluctuate considerably.  It is 160/80 today.  I will have her go back on her lisinopril but at lower dose (5 mg daily).  I will also have her do a 48 hour BP monitor to see how high and low the BP actually gets.    Problem # 4:  HYPERLIPIDEMIA-MIXED (ICD-272.4) Check lipids/LFTs on statin.   Problem # 5:  CAROTID STENOSIS Status post CEA.  Restart ASA 81 mg daily.   Problem # 6:  ASYMMETRIC LOWER EXTREMITY EDEMA Left >> right lower leg edema.  Patient has noted this for several weeks now.  I had her do a lower extremity doppler ultrasound today, this did not show a DVT.   Other Orders: Venous Duplex Lower Extremity (Venous Duplex Lower) Echocardiogram (Echo) Misc. Referral (Misc. Ref)  Patient Instructions: 1)  Your physician has recommended you make the following change in your medication:  2)  Start Aspirin 81mg  daily--this should be buffered or coated 3)  Start  Lisinopril 5mg  daily 4)  Your physician recommends that you return for a FASTING lipid profile/liver  profile/BMP/CBC--you can have this at [facility]-you have the order 5)  You will be  scheduled for a 48 hour blood pressure monitor  6)  Be sure to wear your support hose and stand up slowly 7)  Your physician has requested that you have a lower or upper extremity venous duplex.  This test is an ultrasound of the veins in the legs or arms.  It looks at venous blood flow that carries blood from the heart to the legs or arms.  Allow one hour for a Lower Venous exam.  Allow thirty minutes for an Upper Venous exam. There are no restrictions or special instructions. THIS WILL BE TODAY 8)  Your physician has requested that you have an echocardiogram.  Echocardiography is a painless test that uses sound waves to create images of your heart. It provides your doctor with information about the size and shape of your heart and how well your heart's chambers and valves are working.  This procedure takes approximately one hour. There are no restrictions for this procedure. 9)  Your physician recommends that you schedule a follow-up appointment in: 6 weeks with Dr Shirlee Latch. Prescriptions: LISINOPRIL 5 MG TABS (LISINOPRIL) one tablet daily  #30 x 6   Entered by:   Katina Dung, RN, BSN   Authorized by:   Marca Ancona, MD   Signed by:   Katina Dung, RN, BSN on 09/08/2009   Method used:   Electronically to        [facility] [phone]* (retail)       [address] [place], [place]  [address]       Ph: [phone] or [phone]       Fax: [phone]   RxID:   [id number]

## 2010-07-14 NOTE — Progress Notes (Signed)
Summary: edema in her feet  LMOM to call back  Phone Note Call from Patient Call back at [phone]   Caller: Patient Reason for Call: Talk to Nurse Summary of Call: pt is gathering fluid in her feet and wants to let someone know Initial call taken by: Omer Jack,  June 25, 2009 12:31 PM  Follow-up for Phone Call        Called patient and left message on machine to call back.  Pam Fleming-Hayes,RN     Appended Document: edema in her feet  LMOM to call back We probably ought to see her in the office.  Is she more short of breath?   Appended Document: edema in her feet  LMOM to call back talked with patient--pt denies increase in SOB--pt states she saw Dr Cleone Slim yesterday and discussed with him--he recommended elevate  feet and legs and use support garmets--pt will also try to limit Na in her diet--she declined earlier appointment with Dr Shirlee Latch but said she would call if  she changed her mind--Reviewed with Dr Shirlee Latch

## 2010-07-14 NOTE — Progress Notes (Signed)
Summary: ambulatory B/P monitor results  Phone Note Outgoing Call   Call placed by: Katina Dung, RN, BSN,  September 11, 2009 8:15 AM Call placed to: Patient Summary of Call: Ambulatory B/P monitor results  Follow-up for Phone Call        Dr Shirlee Latch reveiwed ambulatory B/P monitor results from 09-08-09--he recommended if pt tolerates  Lisinopril  5mg  daily increase  to 10mg  daily after 1 week--I discussed with pt 09-10-09..she started Lisinopril 5mg  daily 09-09-09 and is tolerating it OK-she knows to increase to 10mg  daily in 1 week if she is tolerating it OK-she is scheduled for BMP/CBC/LIPID/PT/PTT/Liver 09-19-09 prior to explant of loop recorder 09-23-09

## 2010-07-14 NOTE — Letter (Signed)
Summary: External Correspondence/ [facility]  External Correspondence/ [facility]   Imported By: Dorise Hiss 05/11/2010 11:11:14  _____________________________________________________________________  External Attachment:    Type:   Image     Comment:   External Document

## 2010-07-14 NOTE — Miscellaneous (Signed)
Summary: Outpatient Coinsurance Notice  Outpatient Coinsurance Notice   Imported By: Marylou Mccoy 09/19/2009 18:14:16  _____________________________________________________________________  External Attachment:    Type:   Image     Comment:   External Document

## 2010-07-14 NOTE — Procedures (Signed)
Summary: Cardiology Device Clinic   Allergies: No Known Drug Allergies   ILR Following MD Hillis Range, MD DOI:  01/31/2009 Vendor:  Medtronic     Model Number:  [id number]     Serial Number [id number]       Tachy Episodes:  0     Brady Episodes:  0  Tech Comments:  Seen with Dr Shirlee Latch- no arrythmias to correlate with syncopal spells.  AFib episodes are PVC's causing irregular R-R intervals.  ROV as scheduled. Gypsy Balsam RN BSN  September 09, 2009 10:01 AM

## 2010-07-14 NOTE — Letter (Signed)
Summary: Implantable Device Instructions  Architectural technologist, Main Office  [address]   [place], [place] [address]   Phone: [phone]  Fax: [phone]      Implantable Device Instructions  You are scheduled for:   __x___ Implantable Loop Recorder Explant  on Tuesday April 12,2011  with Dr. Johney Frame.  1.  Please arrive at the Short Stay Center at [facility] at 10:30 am on the day of your procedure.  2.  Do not eat or drink after midnight the night before your procedure.  3.  Complete lab work on Friday April 8,2011.  The lab at [facility] is open from 8:30 AM to 1:30 PM and from 2:30 PM to 5:00 PM.  The lab at [facility] is open from 7:30 AM to 5:30 PM.  You do not have to be fasting.   4.  Wash your chest and neck with antibacterial soap (any brand) the evening before and the morning of your procedure.  Rinse well.    *If you have ANY questions after you get home, please call the office [phone].  Katina Dung   *Every attempt is made to prevent procedures from being rescheduled.  Due to the nauture of Electrophysiology, rescheduling can happen.  The physician is always aware and directs the staff when this occurs.

## 2010-07-14 NOTE — Progress Notes (Signed)
Summary: nurse from [facility] wanted to give an update   Phone Note From Other Clinic Call back at [phone]   Caller: Herbert Seta w/ [facility] Request: Talk with Nurse, Talk with Provider Summary of Call: pt has been in the hospital and is now in a nursing home for rehab and they where not sure if we knew this or not the [facility] and number is [phone] Initial call taken by: Omer Jack,  January 27, 2010 10:40 AM  Follow-up for Phone Call        talked with Herbert Seta at [facility]--pt is currently in [facility], skilled nursing facility, in [place]. Pt was D/C from [facility] 01/16/10 deconditioning due to sepsis and colon perforation. Dr Shirlee Latch is aware.

## 2010-07-14 NOTE — Cardiovascular Report (Signed)
Summary: Pre Op Orders  Pre Op Orders   Imported By: Roderic Ovens 09/18/2009 11:52:59  _____________________________________________________________________  External Attachment:    Type:   Image     Comment:   External Document

## 2010-07-16 ENCOUNTER — Telehealth: Payer: Self-pay | Admitting: Cardiology

## 2010-07-16 NOTE — Letter (Signed)
Summary: External Correspondence/ [facility]  External Correspondence/ [facility]   Imported By: Dorise Hiss 06/18/2010 12:17:12  _____________________________________________________________________  External Attachment:    Type:   Image     Comment:   External Document

## 2010-07-16 NOTE — Progress Notes (Signed)
Summary: problem with bp  Phone Note Call from Patient   Caller: Patient [phone] Reason for Call: Talk to Nurse Summary of Call: re blood pressure Initial call taken by: Glynda Jaeger,  July 09, 2010 11:15 AM  Follow-up for Phone Call        pt states on Tuesday she felt dizzy and weak -she  was able to gradually lower  herself  to the floor -  her B/P was 75/57--she states she drank some Gatorade and ate salt-- B/P later  was 109/82--yesterday B/P 75/55 and pulse 103--today when going to door to get meals on wheels pt felt weak and dizzy and she was able to gradually lower herself to the floor--her  B/P was 109/82,HR 109--pt states she feels better now --pt states she did not pass out or fall --she did not  injury herself --she states she has had a poor appetite the last few days--I reviewed  with Dr Darvin Neighbours recommended stopping Lisinopril and decreasing Toprol XL to 12.5mg  daily--pt verbalized understanding-she will make an effort to eat and drink --she will keep a check on  her B/P and heart rate    New/Updated Medications: TOPROL XL 25 MG XR24H-TAB (METOPROLOL SUCCINATE) one-half daily   Current Medications (verified): 1)  Folic Acid 1 Mg Tabs (Folic Acid) .... Take 1 Tablet Once Daily 2)  Octreotide Acetate 1000 Mcg/ml Soln (Octreotide Acetate) .... Use Every 12 Hours 3)  Amitriptyline Hcl 25 Mg Tabs (Amitriptyline Hcl) .Marland Kitchen.. 1 Tab At Bedtime 4)  Pantoprazole Sodium 40 Mg Tbec (Pantoprazole Sodium) .Marland Kitchen.. 1 Tab Two Times A Day 5)  Ambien 10 Mg Tabs (Zolpidem Tartrate) .Marland Kitchen.. 1 Tab Bedtime 6)  Zofran 8 Mg Tabs (Ondansetron Hcl) .Marland Kitchen.. 1 Tab Every 8 Hours As Needed For Nausea and Vomitin 7)  Cipro 250 Mg Tabs (Ciprofloxacin Hcl) .Marland Kitchen.. 1 Tab Two Times A Day 8)  Hydrocodone-Acetaminophen 5-500 Mg Tabs (Hydrocodone-Acetaminophen) .Marland Kitchen.. 1 Tab Every 4 Hours As Needed 9)  Toprol Xl 25 Mg Xr24h-Tab (Metoprolol Succinate) .... One-Half Daily 10)  Lipitor 40 Mg Tabs (Atorvastatin Calcium)  .... One Daily  Allergies: No Known Drug Allergies  pt is aware if she continues to feel weak and dizzy or is unable to stay well hydrated she should call 911

## 2010-07-16 NOTE — Assessment & Plan Note (Signed)
Summary: follow up appt rs missed appt in sept/mt   Visit Type:  Follow-up Referring Provider:  Marca Ancona, MD Primary Provider:  Paulene Floor  CC:  shortness of breath and headaches.  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  She underwent treatment with Velcade and dexamethasone for systemic amyloidosis.  Monoclonal light chain count fell nicely with treatment, but she developed intractable diarrhea with bowel perforations in 1/11 and 7/11.  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  She was in a SNF for 5 months and has been home again for about 7 weeks.  She is off TPN and eating well.  Her weight is increasing.  Echo in 4/11 showed severe LV hypertrophy with stable LV mid-cavity gradient with valsalva.    She has been doing well at home.  No falls or syncope.  Rare orthostatic lightheadedness.  She is careful to take her time when she stands up from sitting down.  She is very easily fatigued just walking around her house.  She had home PT and has been released.  No chest pain.  She was taken off of statin/Toprol XL/lisinopril at some point during her prolonged hospitalization and SNF course, she does not know why.  BP has been running high at home, 150s/90s.    ECG: NSR, LVH with repolarization, Qs V1-V3, Qs inferiorly  Labs (7/10): LDL 87, HDL 52, ALT 46, AST 33 Labs (8/10): K 4.4, creatinine 1.0, BNP 119, HCT 30.6, SPEP negative, UPEP positive for monoclonal lambda light chains.  Labs (9/10): creatinine 0.93, C diff negative  Labs (4/11): HDL 58, LDL 127, K 3.5, creatinine 0.8  Current Medications (verified): 1)  Folic Acid 1 Mg Tabs (Folic Acid) .... Take 1 Tablet Once Daily 2)  Octreotide Acetate 1000 Mcg/ml Soln (Octreotide Acetate) .... Use Every 12 Hours 3)  Amitriptyline Hcl 25 Mg Tabs (Amitriptyline Hcl) .Marland Kitchen.. 1 Tab At Bedtime 4)  Pantoprazole Sodium 40 Mg Tbec (Pantoprazole  Sodium) .Marland Kitchen.. 1 Tab Two Times A Day 5)  Ambien 10 Mg Tabs (Zolpidem Tartrate) .Marland Kitchen.. 1 Tab Bedtime 6)  Zofran 8 Mg Tabs (Ondansetron Hcl) .Marland Kitchen.. 1 Tab Every 8 Hours As Needed For Nausea and Vomitin 7)  Cipro 250 Mg Tabs (Ciprofloxacin Hcl) .Marland Kitchen.. 1 Tab Two Times A Day 8)  Hydrocodone-Acetaminophen 5-500 Mg Tabs (Hydrocodone-Acetaminophen) .Marland Kitchen.. 1 Tab Every 4 Hours As Needed  Allergies (verified): No Known Drug Allergies  Past History:  Past Medical History: 1.  Left CEA (10/08).  Most recent carotid dopplers 6/10 with 50-69% RICA stenosis, LICA ok s/p CEA.  2.  HTN 3.  GERD 4.  Hyperlipidemia 5.  Anemia:  She has been on Aranesp for ? anemia of chronic kidney disease though renal function does not seem severely impaired.  She had repeat bone marrow biopsy by Dr. Cleone Slim 9/10. 6.  s/p hysterectomy 7.  Mid-cavity dynamic LV gradient:  Echo (4/11) with EF 65-70%, severe concentric LVH, peak LV mid-cavity gradient reaching 45 mmHg with valsalva, grade I diastolic dysfunction, RV hypertrophy, small pericardial effusion. 8.  Adenosine myoview (5/10):  No evidence for ischemia or infarction.  9.  Episodes of syncope and presyncope.  - Patient has Medtronic loop recorder implanted 8/10.  No events so far - May be due only to orthostasis 10. 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   - Cardiac MRI (8/10): EF 66%, mild to moderate concentric  LVH, RV size and systolic function normal with no evidence for ARVC, trivial pericardial effusion.  On delayed enhancement, it was difficult to obtain the TI.  There was patchy mid-wall basal delayed enhancement.  There was partially circumferential subendocardial delayed enhancement in the mid to apical LV.  This was suggestive of infiltrative cardiomyopathy versus possible HCM.  - SPEP negative.  UPEP positive for monoclonal lambda light chains.  Monoclonal light chains confirmed on testing by Dr. Cleone Slim.  - CT chest (8/10) to assess  for sarcoidosis: no evidence by CT for pulmonary sarcoid.  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  11.  C. difficile colitis with orthostatic hypotension 12. Diverticulitis (1/11) with perforation and colostomy.  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  14. 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 Mother died suddenly with presumed MI at 9.  Father died suddenly with presumed MI at age 39.  Cousin died suddenly, ? age.   Social History: Reviewed history from 09/08/2009 and no changes required. Quit tobacco in 1980.  No ETOH or drugs.  Single, lives in [place], 1 child (daughter in [place]).  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   Review of Systems       All systems reviewed and negative except as per HPI.   Vital Signs:  Patient profile:   70 year old female Height:      63 inches Weight:      93 pounds BMI:     16.53 Pulse rate:   91 / minute BP sitting:   152 / 96  (left arm) Cuff size:   regular  Vitals Entered By: Caralee Ates CMA (June 29, 2010 10:05 AM)  Physical Exam  General:  Thin, no apparent distress Neck:  Neck supple, no JVD. No masses, thyromegaly or abnormal cervical nodes. Lungs:  Clear bilaterally to auscultation and percussion. Heart:  Non-displaced PMI, chest non-tender; regular rate and rhythm, S1, S2, 2/6 systolic crescendo-decrescendo early systolic murmur upper sternal border. No S3/S4.  Carotid upstroke normal, no bruit. Pedals normal pulses. No edema.  Abdomen:  Bowel sounds positive; abdomen soft and non-tender without masses, organomegaly, or hernias noted. No hepatosplenomegaly.  Colostomy.  Extremities:  No clubbing or cyanosis. Neurologic:  Alert and oriented x 3. Psych:  Normal affect.    ILR Following MD Hillis Range, MD DOI:   01/31/2009 Vendor:  Medtronic     Model Number:  [id number]     Serial Number [id number]        Impression & Recommendations:  Problem # 1:  CARDIOMYOPATHY, AMYLOID (ICD-425.7) Patient has a cardiomyopathy with preserved LV systolic function.  Delayed enhancement on MRI suggested an infiltrative disease or hypertrophic cardiomyopathy rather than coronary disease, and she has had a negative myoview.  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  This was confirmed by testing at Dr. Bertha Stakes office as well.  CT chest was not suggestive of pulmonary sarcoidosis. Given the finding of monoclonal light chains and suggestive MRI findings, she was treated for amyloidosis by Dr. Cleone Slim with Velcade and dexamethasone.  Unfortunately, she developed severe diarrhea and had 2 bowel perforations. Therefore, she is off treatment now.   Echo in 4/11 showed stable LV mid-cavity gradient.  - She does not appear significantly volume overloaded today.  No Lasix  as she does not appear overloaded and this could worsen her mid-cavity gradient and orthostasis.  - Restart Toprol XL 25 mg daily as this may help decrease LV mid-cavity gradient.   - BNP today.   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  No further syncope.  Still has loop recorder in and will followup with Dr. Johney Frame.   Problem # 3:  HYPERTENSION, UNSPECIFIED (ICD-401.9) BP running high.  Will carefully restart her Toprol XL 25 mg daily.  She also was on lisinopril 10 mg daily in the past.  I will restart it at 5 mg daily. BP check in 2 wks.   Problem # 4:  HYPERLIPIDEMIA-MIXED (ICD-272.4) Will check lipids/LFTs today to see if we need to put her back on statin (was on Crestor in the past).   Other Orders: EP Referral (Cardiology EP Ref ) TLB-Hepatic/Liver Function Pnl (80076-HEPATIC) TLB-Lipid Panel (80061-LIPID) TLB-BMP (Basic Metabolic Panel-BMET)  (80048-METABOL) TLB-BNP (B-Natriuretic Peptide) (83880-BNPR)  Patient Instructions: 1)  Your physician has recommended you make the following change in your medication:  2)  Start Toprol XL (metoprolol succinate) 25mg  daily. 3)  Start Lisinopril 5mg  daily. 4)  Your physician recommends that you have  a FASTING lipid profile/liver profile/BMP/BNP today--428.32  425.4 5)  Take and record your blood pressure and pulse--I will call you in 2 weeks to get the readings. Anne Lankford,RN   6)  You have been referred to Dr Johney Frame for followup of your loop recorder. 7)  Your physician wants you to follow-up in: 4 months with Dr Shirlee Latch.(May 2012)  You will receive a reminder letter in the mail two months in advance. If you don't receive a letter, please call our office to schedule the follow-up appointment. Prescriptions: LISINOPRIL 5 MG TABS (LISINOPRIL) one daily  #30 x 6   Entered by:   Katina Dung, RN, BSN   Authorized by:   Marca Ancona, MD   Signed by:   Katina Dung, RN, BSN on 06/29/2010   Method used:   Electronically to        [facility] [phone]* (retail)       [address] [place], [place]  [address]       Ph: [phone] or [phone]       Fax: [phone]   RxID:   [id number] TOPROL XL 25 MG XR24H-TAB (METOPROLOL SUCCINATE) one daily  #30 x 6   Entered by:   Katina Dung, RN, BSN   Authorized by:   Marca Ancona, MD   Signed by:   Katina Dung, RN, BSN on 06/29/2010   Method used:   Electronically to        [facility] [phone]* (retail)       [address] [place], [place]  [address]       Ph: [phone] or [phone]       Fax: [phone]   RxID:   [id number]

## 2010-07-22 NOTE — Progress Notes (Signed)
Summary: B/P readings  Phone Note Outgoing Call   Call placed by: Katina Dung, RN, BSN,  July 16, 2010 5:45 PM Call placed to: Patient Summary of Call: B/P   Follow-up for Phone Call        called pt to get BP readings--LMTCB Katina Dung, RN, BSN  July 16, 2010 5:46 PM --[facility] Katina Dung, RN, BSN  July 17, 2010 8:50 AM --I talked with pt--she is an inpatient at [facility] in [place] --she states she was admitted the afternoon of 07/09/10--she was told she was dehydrated and was diagnosed with a partial bowel obstruction--pt states she is better now and has actually been up walking in the halls of the hospital(she did 4 laps) without problems-she is unsure of her BP readings while in the hospital--she states she will call me back if any problems with her BP after DC from hospital

## 2010-07-28 ENCOUNTER — Ambulatory Visit (INDEPENDENT_AMBULATORY_CARE_PROVIDER_SITE_OTHER): Payer: Medicare Other | Admitting: Internal Medicine

## 2010-07-28 DIAGNOSIS — R197 Diarrhea, unspecified: Secondary | ICD-10-CM

## 2010-07-28 DIAGNOSIS — R1310 Dysphagia, unspecified: Secondary | ICD-10-CM

## 2010-08-02 NOTE — Consult Note (Signed)
NAMEJULIETA, [name]               ACCOUNT NO.:  [id number]  MEDICAL RECORD NO.:  [id number]          PATIENT TYPE:  AMB  LOCATION:  [place]                           FACILITY: [facility]  PHYSICIAN:  Lionel December, M.D.    DATE OF BIRTH:  [birth year]  DATE:  07/28/2010                                OFFICE VISIT   PRESENTING COMPLAINT:  Followup for secretory diarrhea and dysphagia.  SUBJECTIVE:  [name] is a 70 year old very pleasant African American female who is here for scheduled visit.  I last saw her in the office in November 2011, for dysphagia.  She underwent EGD with esophageal dilation.  While no significant abnormality was found on exam, her dysphagia responded to esophageal dilation.  On her last visit, she weighed 84 pounds.  She states since that visit, she has been admitted to [facility] on 3 occasions, one admission was for diarrhea, second one was for small bowel obstruction and she states she was there for almost 2 weeks and had NG tube.  Third admission was sought for small bowel obstruction.  She was seen by Dr. Posey Rea and was felt that she had a problem due to adhesions.  While in the hospital, she experienced copious diarrhea, but as soon as she was placed back on her Sandostatin her diarrhea resolved.  She states she is getting back on a feet again.  Her appetite is good. She states she has no difficulty swallowing foods and even her pills. According to records, she has gained about 11 pounds since the last visit.  She states her ileostomy output is back to her baseline.  She generally has a pasty or very soft stool.  She has not passed any blood. 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  CURRENT MEDICATIONS: 1. Protonix 40 mg p.o. q.a.m. 2. MVI daily. 3. Octreotide 100 mcg subcu q.12. 4. Simethicone liquid 125 mg once or twice daily. 5. Desyrel 100 mg p.o. nightly. 6. Folic acid 1 mg daily. 7. Zofran 4 mg q.6 p.r.n. 8.  Hydrocodone 5/500 q.4 p.r.n.  OBJECTIVE:  VITAL SIGNS:  Weight 95-1/2 pounds, she is 63 inches tall, pulse 70 per minute, blood pressure 112/68 and temp is 98.1. EYES:  Conjunctivae are pink.  Sclerae are nonicteric. MOUTH:  Oropharyngeal mucosa is normal. NECK:  No neck masses or thyromegaly noted. ABDOMEN:  Symmetrical.  Ileostomy bag in the right lower quadrant has soft pasty stool.  Mucosa at mucous fistula on the left side appears to be quite healthy.  Abdomen is soft and nontender.  She has a well-healed scars. EXTREMITIES:  She does not have peripheral edema or clubbing.  ASSESSMENT: 1. 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  She is doing     well with b.i.d. schedule of octreotide.  In the past, we were not     able to get her LAR or once a month preparation. 2. History of dysphagia.  She has had excellent response to esophageal     dilation.  PLAN:  She will continue her Protonix and octreotide at current dose. She will  return for office on as-needed basis.     Lionel December, M.D.     NR/MEDQ  D:  07/28/2010  T:  07/29/2010  Job:  [id number]  cc:   Lynett Fish, M.D.  Electronically Signed by Lionel December M.D. on 08/02/2010 01:58:24 PM

## 2010-08-10 ENCOUNTER — Telehealth: Payer: Self-pay | Admitting: Cardiology

## 2010-08-20 NOTE — Progress Notes (Signed)
Summary: pt out hospital  Phone Note Call from Patient Call back at Home Phone [phone]   Caller: Patient Reason for Call: Talk to Nurse Summary of Call: pt wanted anne to know she is out of hospital.  Initial call taken by: Lorne Skeens,  August 10, 2010 4:27 PM  Follow-up for Phone Call        I talked with pt--she states she got out of the hospital on Friday for partial bowel obstruction--she is doing OK and did not have to have surgery

## 2010-08-25 ENCOUNTER — Telehealth: Payer: Self-pay | Admitting: Cardiology

## 2010-08-25 LAB — CBC
MCH: 29.7 pg (ref 26.0–34.0)
MCHC: 33.6 g/dL (ref 30.0–36.0)
Platelets: 243 10*3/uL (ref 150–400)
RBC: 3.07 MIL/uL — ABNORMAL LOW (ref 3.87–5.11)
RDW: 16.6 % — ABNORMAL HIGH (ref 11.5–15.5)

## 2010-08-25 LAB — URINALYSIS, ROUTINE W REFLEX MICROSCOPIC
Glucose, UA: NEGATIVE mg/dL
Leukocytes, UA: NEGATIVE
Nitrite: NEGATIVE
Specific Gravity, Urine: 1.02 (ref 1.005–1.030)
pH: 5 (ref 5.0–8.0)

## 2010-08-25 LAB — DIFFERENTIAL
Eosinophils Absolute: 0 10*3/uL (ref 0.0–0.7)
Lymphocytes Relative: 14 % (ref 12–46)
Lymphs Abs: 0.9 10*3/uL (ref 0.7–4.0)
Monocytes Relative: 5 % (ref 3–12)
Neutro Abs: 4.8 10*3/uL (ref 1.7–7.7)
Neutrophils Relative %: 80 % — ABNORMAL HIGH (ref 43–77)

## 2010-08-25 LAB — COMPREHENSIVE METABOLIC PANEL
AST: 30 U/L (ref 0–37)
Albumin: 2.9 g/dL — ABNORMAL LOW (ref 3.5–5.2)
Calcium: 9.4 mg/dL (ref 8.4–10.5)
Creatinine, Ser: 0.88 mg/dL (ref 0.4–1.2)
GFR calc Af Amer: >60 mL/min (ref >60)
Total Protein: 6.8 g/dL (ref 6.0–8.3)

## 2010-08-25 LAB — URINE CULTURE
Colony Count: NO GROWTH
Culture  Setup Time: 201111061911

## 2010-08-25 LAB — URINE MICROSCOPIC-ADD ON

## 2010-08-25 LAB — LIPASE, BLOOD: Lipase: 60 U/L — ABNORMAL HIGH (ref 11–59)

## 2010-08-26 LAB — CBC
Hemoglobin: 10 g/dL — ABNORMAL LOW (ref 12.0–15.0)
MCH: 29.2 pg (ref 26.0–34.0)
MCV: 86.3 fL (ref 78.0–100.0)
Platelets: 236 10*3/uL (ref 150–400)
RBC: 3.41 MIL/uL — ABNORMAL LOW (ref 3.87–5.11)

## 2010-08-26 LAB — URINE CULTURE: Colony Count: 75000

## 2010-08-26 LAB — DIFFERENTIAL
Basophils Absolute: 0 10*3/uL (ref 0.0–0.1)
Lymphocytes Relative: 26 % (ref 12–46)
Neutro Abs: 4.5 10*3/uL (ref 1.7–7.7)
Neutrophils Relative %: 67 % (ref 43–77)

## 2010-08-26 LAB — COMPREHENSIVE METABOLIC PANEL
BUN: 20 mg/dL (ref 6–23)
CO2: 23 mEq/L (ref 19–32)
Chloride: 105 mEq/L (ref 96–112)
Creatinine, Ser: 0.82 mg/dL (ref 0.4–1.2)
GFR calc non Af Amer: >60 mL/min (ref >60)
Total Bilirubin: 0.3 mg/dL (ref 0.3–1.2)

## 2010-08-26 LAB — URINALYSIS, ROUTINE W REFLEX MICROSCOPIC
Bilirubin Urine: NEGATIVE
Glucose, UA: NEGATIVE mg/dL
Hgb urine dipstick: NEGATIVE
Protein, ur: NEGATIVE mg/dL

## 2010-08-26 LAB — URINE MICROSCOPIC-ADD ON

## 2010-08-26 LAB — LIPASE, BLOOD: Lipase: 53 U/L (ref 11–59)

## 2010-08-27 LAB — BASIC METABOLIC PANEL
BUN: 1 mg/dL — ABNORMAL LOW (ref 6–23)
BUN: 1 mg/dL — ABNORMAL LOW (ref 6–23)
BUN: 2 mg/dL — ABNORMAL LOW (ref 6–23)
BUN: 2 mg/dL — ABNORMAL LOW (ref 6–23)
CO2: 21 mEq/L (ref 19–32)
CO2: 22 mEq/L (ref 19–32)
CO2: 22 mEq/L (ref 19–32)
Calcium: 7.5 mg/dL — ABNORMAL LOW (ref 8.4–10.5)
Calcium: 7.8 mg/dL — ABNORMAL LOW (ref 8.4–10.5)
Calcium: 7.9 mg/dL — ABNORMAL LOW (ref 8.4–10.5)
Calcium: 8 mg/dL — ABNORMAL LOW (ref 8.4–10.5)
Chloride: 110 mEq/L (ref 96–112)
Chloride: 111 mEq/L (ref 96–112)
Creatinine, Ser: 0.97 mg/dL (ref 0.4–1.2)
Creatinine, Ser: 0.99 mg/dL (ref 0.4–1.2)
Creatinine, Ser: 1.01 mg/dL (ref 0.4–1.2)
Creatinine, Ser: 1.05 mg/dL (ref 0.4–1.2)
Creatinine, Ser: 1.05 mg/dL (ref 0.4–1.2)
Creatinine, Ser: 1.1 mg/dL (ref 0.4–1.2)
GFR calc Af Amer: >60 mL/min (ref >60)
GFR calc Af Amer: >60 mL/min (ref >60)
GFR calc Af Amer: >60 mL/min (ref >60)
GFR calc non Af Amer: 49 mL/min — ABNORMAL LOW (ref >60)
GFR calc non Af Amer: 49 mL/min — ABNORMAL LOW (ref >60)
GFR calc non Af Amer: 52 mL/min — ABNORMAL LOW (ref >60)
GFR calc non Af Amer: 52 mL/min — ABNORMAL LOW (ref >60)
GFR calc non Af Amer: 55 mL/min — ABNORMAL LOW (ref >60)
Glucose, Bld: 72 mg/dL (ref 70–99)
Glucose, Bld: 75 mg/dL (ref 70–99)
Glucose, Bld: 80 mg/dL (ref 70–99)
Glucose, Bld: 85 mg/dL (ref 70–99)
Glucose, Bld: 90 mg/dL (ref 70–99)
Potassium: 3.3 mEq/L — ABNORMAL LOW (ref 3.5–5.1)
Potassium: 3.6 mEq/L (ref 3.5–5.1)
Potassium: 3.7 mEq/L (ref 3.5–5.1)
Sodium: 136 mEq/L (ref 135–145)
Sodium: 137 mEq/L (ref 135–145)
Sodium: 138 mEq/L (ref 135–145)

## 2010-08-27 LAB — CBC
HCT: 30.5 % — ABNORMAL LOW (ref 36.0–46.0)
HCT: 31.2 % — ABNORMAL LOW (ref 36.0–46.0)
HCT: 32.1 % — ABNORMAL LOW (ref 36.0–46.0)
HCT: 32.5 % — ABNORMAL LOW (ref 36.0–46.0)
Hemoglobin: 10 g/dL — ABNORMAL LOW (ref 12.0–15.0)
Hemoglobin: 10.2 g/dL — ABNORMAL LOW (ref 12.0–15.0)
Hemoglobin: 10.6 g/dL — ABNORMAL LOW (ref 12.0–15.0)
Hemoglobin: 10.9 g/dL — ABNORMAL LOW (ref 12.0–15.0)
MCH: 29 pg (ref 26.0–34.0)
MCH: 29.1 pg (ref 26.0–34.0)
MCH: 29.1 pg (ref 26.0–34.0)
MCH: 29.4 pg (ref 26.0–34.0)
MCHC: 33.2 g/dL (ref 30.0–36.0)
MCHC: 33.5 g/dL (ref 30.0–36.0)
MCHC: 33.6 g/dL (ref 30.0–36.0)
MCHC: 33.7 g/dL (ref 30.0–36.0)
MCHC: 33.9 g/dL (ref 30.0–36.0)
MCV: 86.5 fL (ref 78.0–100.0)
MCV: 86.8 fL (ref 78.0–100.0)
MCV: 87.8 fL (ref 78.0–100.0)
Platelets: 203 10*3/uL (ref 150–400)
Platelets: 257 10*3/uL (ref 150–400)
Platelets: 263 10*3/uL (ref 150–400)
Platelets: 264 10*3/uL (ref 150–400)
Platelets: 274 10*3/uL (ref 150–400)
RBC: 3.45 MIL/uL — ABNORMAL LOW (ref 3.87–5.11)
RBC: 3.66 MIL/uL — ABNORMAL LOW (ref 3.87–5.11)
RDW: 16 % — ABNORMAL HIGH (ref 11.5–15.5)
RDW: 16.1 % — ABNORMAL HIGH (ref 11.5–15.5)
RDW: 16.1 % — ABNORMAL HIGH (ref 11.5–15.5)
RDW: 16.2 % — ABNORMAL HIGH (ref 11.5–15.5)
RDW: 16.6 % — ABNORMAL HIGH (ref 11.5–15.5)
WBC: 6.7 10*3/uL (ref 4.0–10.5)
WBC: 7 10*3/uL (ref 4.0–10.5)

## 2010-08-27 LAB — CLOSTRIDIUM DIFFICILE EIA: C difficile Toxins A+B, EIA: NEGATIVE

## 2010-08-27 LAB — DIFFERENTIAL
Basophils Absolute: 0 10*3/uL (ref 0.0–0.1)
Lymphocytes Relative: 16 % (ref 12–46)
Monocytes Absolute: 0.4 10*3/uL (ref 0.1–1.0)
Neutro Abs: 5.1 10*3/uL (ref 1.7–7.7)
Neutrophils Relative %: 76 % (ref 43–77)

## 2010-08-28 ENCOUNTER — Other Ambulatory Visit: Payer: Self-pay

## 2010-08-28 ENCOUNTER — Encounter: Payer: Self-pay | Admitting: Cardiology

## 2010-08-28 LAB — DIFFERENTIAL
Basophils Absolute: 0 10*3/uL (ref 0.0–0.1)
Eosinophils Absolute: 0.1 10*3/uL (ref 0.0–0.7)
Eosinophils Relative: 0 % (ref 0–5)
Lymphocytes Relative: 17 % (ref 12–46)
Lymphocytes Relative: 13 % (ref 12–46)
Lymphs Abs: 1.6 10*3/uL (ref 0.7–4.0)
Lymphs Abs: 1.8 10*3/uL (ref 0.7–4.0)
Monocytes Absolute: 1.1 10*3/uL — ABNORMAL HIGH (ref 0.1–1.0)
Monocytes Relative: 8 % (ref 3–12)
Neutro Abs: 7.3 10*3/uL (ref 1.7–7.7)
Neutrophils Relative %: 77 % (ref 43–77)

## 2010-08-28 LAB — BASIC METABOLIC PANEL
CO2: 21 mEq/L (ref 19–32)
CO2: 29 mEq/L (ref 19–32)
Calcium: 7.5 mg/dL — ABNORMAL LOW (ref 8.4–10.5)
Chloride: 103 mEq/L (ref 96–112)
GFR calc Af Amer: >60 mL/min (ref >60)
GFR calc Af Amer: >60 mL/min (ref >60)
GFR calc non Af Amer: >60 mL/min (ref >60)
Glucose, Bld: 69 mg/dL — ABNORMAL LOW (ref 70–99)
Potassium: 3.4 mEq/L — ABNORMAL LOW (ref 3.5–5.1)
Potassium: 3.5 mEq/L (ref 3.5–5.1)
Sodium: 136 mEq/L (ref 135–145)

## 2010-08-28 LAB — URINALYSIS, ROUTINE W REFLEX MICROSCOPIC
Glucose, UA: NEGATIVE mg/dL
Glucose, UA: NEGATIVE mg/dL
Hgb urine dipstick: NEGATIVE
Leukocytes, UA: NEGATIVE
Nitrite: NEGATIVE
Protein, ur: 30 mg/dL — AB
Specific Gravity, Urine: 1.02 (ref 1.005–1.030)
Urobilinogen, UA: 0.2 mg/dL (ref 0.0–1.0)
pH: 6 (ref 5.0–8.0)

## 2010-08-28 LAB — CBC
HCT: 30.1 % — ABNORMAL LOW (ref 36.0–46.0)
HCT: 36.8 % (ref 36.0–46.0)
HCT: 26.6 % — ABNORMAL LOW (ref 36.0–46.0)
HCT: 27.8 % — ABNORMAL LOW (ref 36.0–46.0)
Hemoglobin: 12.5 g/dL (ref 12.0–15.0)
Hemoglobin: 9.9 g/dL — ABNORMAL LOW (ref 12.0–15.0)
Hemoglobin: 8.6 g/dL — ABNORMAL LOW (ref 12.0–15.0)
MCH: 29.3 pg (ref 26.0–34.0)
MCH: 29.9 pg (ref 26.0–34.0)
MCH: 28.5 pg (ref 26.0–34.0)
MCHC: 32.9 g/dL (ref 30.0–36.0)
MCHC: 33.9 g/dL (ref 30.0–36.0)
MCHC: 32.7 g/dL (ref 30.0–36.0)
MCV: 88.2 fL (ref 78.0–100.0)
MCV: 88.1 fL (ref 78.0–100.0)
Platelets: 356 10*3/uL (ref 150–400)
RBC: 3.39 MIL/uL — ABNORMAL LOW (ref 3.87–5.11)
RBC: 3.02 MIL/uL — ABNORMAL LOW (ref 3.87–5.11)
RDW: 15.5 % (ref 11.5–15.5)
WBC: 13.8 10*3/uL — ABNORMAL HIGH (ref 4.0–10.5)

## 2010-08-28 LAB — COMPREHENSIVE METABOLIC PANEL
BUN: 4 mg/dL — ABNORMAL LOW (ref 6–23)
CO2: 24 mEq/L (ref 19–32)
Calcium: 8.5 mg/dL (ref 8.4–10.5)
Chloride: 106 mEq/L (ref 96–112)
Creatinine, Ser: 0.96 mg/dL (ref 0.4–1.2)
GFR calc Af Amer: >60 mL/min (ref >60)
GFR calc non Af Amer: 58 mL/min — ABNORMAL LOW (ref >60)
Glucose, Bld: 87 mg/dL (ref 70–99)
Total Bilirubin: 0.6 mg/dL (ref 0.3–1.2)

## 2010-08-28 LAB — URINE MICROSCOPIC-ADD ON

## 2010-08-28 LAB — URINE CULTURE
Colony Count: NO GROWTH
Culture: NO GROWTH

## 2010-08-28 LAB — CLOSTRIDIUM DIFFICILE EIA: C difficile Toxins A+B, EIA: NEGATIVE

## 2010-08-28 LAB — PROCALCITONIN: Procalcitonin: 0.17 ng/mL

## 2010-08-28 LAB — MRSA PCR SCREENING: MRSA by PCR: NEGATIVE

## 2010-08-28 LAB — LACTIC ACID, PLASMA: Lactic Acid, Venous: 1.1 mmol/L (ref 0.5–2.2)

## 2010-08-29 LAB — CBC
HCT: 26.1 % — ABNORMAL LOW (ref 36.0–46.0)
HCT: 29.6 % — ABNORMAL LOW (ref 36.0–46.0)
Hemoglobin: 10.9 g/dL — ABNORMAL LOW (ref 12.0–15.0)
MCH: 29.4 pg (ref 26.0–34.0)
MCH: 29.5 pg (ref 26.0–34.0)
MCH: 29.7 pg (ref 26.0–34.0)
MCH: 30 pg (ref 26.0–34.0)
MCHC: 33.1 g/dL (ref 30.0–36.0)
MCHC: 33.2 g/dL (ref 30.0–36.0)
MCHC: 33.5 g/dL (ref 30.0–36.0)
MCHC: 33.7 g/dL (ref 30.0–36.0)
MCV: 88.8 fL (ref 78.0–100.0)
Platelets: 143 10*3/uL — ABNORMAL LOW (ref 150–400)
Platelets: 232 10*3/uL (ref 150–400)
Platelets: 383 10*3/uL (ref 150–400)
Platelets: 393 10*3/uL (ref 150–400)
Platelets: 85 10*3/uL — ABNORMAL LOW (ref 150–400)
RBC: 2.95 MIL/uL — ABNORMAL LOW (ref 3.87–5.11)
RBC: 3.74 MIL/uL — ABNORMAL LOW (ref 3.87–5.11)
RBC: 3.76 MIL/uL — ABNORMAL LOW (ref 3.87–5.11)
RDW: 14.6 % (ref 11.5–15.5)
RDW: 14.7 % (ref 11.5–15.5)
RDW: 15.1 % (ref 11.5–15.5)
RDW: 15.6 % — ABNORMAL HIGH (ref 11.5–15.5)
RDW: 16.1 % — ABNORMAL HIGH (ref 11.5–15.5)
RDW: 16.4 % — ABNORMAL HIGH (ref 11.5–15.5)
WBC: 7.4 10*3/uL (ref 4.0–10.5)
WBC: 9.8 10*3/uL (ref 4.0–10.5)

## 2010-08-29 LAB — COMPREHENSIVE METABOLIC PANEL
ALT: 16 U/L (ref 0–35)
ALT: 17 U/L (ref 0–35)
AST: 28 U/L (ref 0–37)
Albumin: 1.7 g/dL — ABNORMAL LOW (ref 3.5–5.2)
Alkaline Phosphatase: 140 U/L — ABNORMAL HIGH (ref 39–117)
Calcium: 8.4 mg/dL (ref 8.4–10.5)
Chloride: 92 mEq/L — ABNORMAL LOW (ref 96–112)
GFR calc Af Amer: >60 mL/min (ref >60)
Potassium: 3.5 mEq/L (ref 3.5–5.1)
Sodium: 135 mEq/L (ref 135–145)
Sodium: 136 mEq/L (ref 135–145)
Total Protein: 5.1 g/dL — ABNORMAL LOW (ref 6.0–8.3)
Total Protein: 5.6 g/dL — ABNORMAL LOW (ref 6.0–8.3)

## 2010-08-29 LAB — BASIC METABOLIC PANEL
BUN: 21 mg/dL (ref 6–23)
BUN: 30 mg/dL — ABNORMAL HIGH (ref 6–23)
BUN: 44 mg/dL — ABNORMAL HIGH (ref 6–23)
CO2: 24 mEq/L (ref 19–32)
CO2: 25 mEq/L (ref 19–32)
CO2: 29 mEq/L (ref 19–32)
Calcium: 7.8 mg/dL — ABNORMAL LOW (ref 8.4–10.5)
Calcium: 8.1 mg/dL — ABNORMAL LOW (ref 8.4–10.5)
Chloride: 109 mEq/L (ref 96–112)
Creatinine, Ser: 0.84 mg/dL (ref 0.4–1.2)
Creatinine, Ser: 1.35 mg/dL — ABNORMAL HIGH (ref 0.4–1.2)
GFR calc Af Amer: >60 mL/min (ref >60)
GFR calc Af Amer: >60 mL/min (ref >60)
GFR calc non Af Amer: 51 mL/min — ABNORMAL LOW (ref >60)
GFR calc non Af Amer: >60 mL/min (ref >60)
Glucose, Bld: 66 mg/dL — ABNORMAL LOW (ref 70–99)
Potassium: 4.3 mEq/L (ref 3.5–5.1)
Sodium: 137 mEq/L (ref 135–145)

## 2010-08-29 LAB — DIFFERENTIAL
Basophils Relative: 0 % (ref 0–1)
Eosinophils Absolute: 0 10*3/uL (ref 0.0–0.7)
Eosinophils Relative: 0 % (ref 0–5)
Monocytes Absolute: 0.6 10*3/uL (ref 0.1–1.0)
Monocytes Relative: 6 % (ref 3–12)

## 2010-08-29 LAB — URINALYSIS, ROUTINE W REFLEX MICROSCOPIC
Bilirubin Urine: NEGATIVE
Nitrite: NEGATIVE
Specific Gravity, Urine: 1.01 (ref 1.005–1.030)
pH: 5 (ref 5.0–8.0)

## 2010-08-29 LAB — GLUCOSE, CAPILLARY
Glucose-Capillary: 105 mg/dL — ABNORMAL HIGH (ref 70–99)
Glucose-Capillary: 113 mg/dL — ABNORMAL HIGH (ref 70–99)
Glucose-Capillary: 124 mg/dL — ABNORMAL HIGH (ref 70–99)
Glucose-Capillary: 127 mg/dL — ABNORMAL HIGH (ref 70–99)
Glucose-Capillary: 147 mg/dL — ABNORMAL HIGH (ref 70–99)
Glucose-Capillary: 174 mg/dL — ABNORMAL HIGH (ref 70–99)
Glucose-Capillary: 191 mg/dL — ABNORMAL HIGH (ref 70–99)
Glucose-Capillary: 202 mg/dL — ABNORMAL HIGH (ref 70–99)
Glucose-Capillary: 49 mg/dL — ABNORMAL LOW (ref 70–99)
Glucose-Capillary: 77 mg/dL (ref 70–99)
Glucose-Capillary: 87 mg/dL (ref 70–99)
Glucose-Capillary: 97 mg/dL (ref 70–99)

## 2010-08-29 LAB — PREALBUMIN: Prealbumin: 13.3 mg/dL — ABNORMAL LOW (ref 18.0–45.0)

## 2010-08-29 LAB — URINE MICROSCOPIC-ADD ON

## 2010-08-29 LAB — CLOSTRIDIUM DIFFICILE EIA

## 2010-08-29 LAB — PROCALCITONIN: Procalcitonin: 5.24 ng/mL

## 2010-08-30 LAB — URINALYSIS, ROUTINE W REFLEX MICROSCOPIC
Glucose, UA: NEGATIVE mg/dL
Specific Gravity, Urine: 1.022 (ref 1.005–1.030)

## 2010-08-30 LAB — BASIC METABOLIC PANEL
BUN: 36 mg/dL — ABNORMAL HIGH (ref 6–23)
BUN: 42 mg/dL — ABNORMAL HIGH (ref 6–23)
BUN: 45 mg/dL — ABNORMAL HIGH (ref 6–23)
BUN: 46 mg/dL — ABNORMAL HIGH (ref 6–23)
BUN: 48 mg/dL — ABNORMAL HIGH (ref 6–23)
BUN: 52 mg/dL — ABNORMAL HIGH (ref 6–23)
Calcium: 6.5 mg/dL — ABNORMAL LOW (ref 8.4–10.5)
Chloride: 110 mEq/L (ref 96–112)
Chloride: 113 mEq/L — ABNORMAL HIGH (ref 96–112)
Chloride: 114 mEq/L — ABNORMAL HIGH (ref 96–112)
Chloride: 116 mEq/L — ABNORMAL HIGH (ref 96–112)
Chloride: 119 mEq/L — ABNORMAL HIGH (ref 96–112)
Creatinine, Ser: 1.11 mg/dL (ref 0.4–1.2)
Creatinine, Ser: 1.18 mg/dL (ref 0.4–1.2)
Creatinine, Ser: 1.25 mg/dL — ABNORMAL HIGH (ref 0.4–1.2)
GFR calc Af Amer: 45 mL/min — ABNORMAL LOW (ref >60)
GFR calc Af Amer: 52 mL/min — ABNORMAL LOW (ref >60)
GFR calc non Af Amer: 43 mL/min — ABNORMAL LOW (ref >60)
GFR calc non Af Amer: 46 mL/min — ABNORMAL LOW (ref >60)
GFR calc non Af Amer: 55 mL/min — ABNORMAL LOW (ref >60)
Glucose, Bld: 121 mg/dL — ABNORMAL HIGH (ref 70–99)
Glucose, Bld: 157 mg/dL — ABNORMAL HIGH (ref 70–99)
Potassium: 3 mEq/L — ABNORMAL LOW (ref 3.5–5.1)
Potassium: 3.2 mEq/L — ABNORMAL LOW (ref 3.5–5.1)
Potassium: 3.3 mEq/L — ABNORMAL LOW (ref 3.5–5.1)
Potassium: 4.1 mEq/L (ref 3.5–5.1)
Potassium: 4.3 mEq/L (ref 3.5–5.1)
Sodium: 136 mEq/L (ref 135–145)
Sodium: 150 mEq/L — ABNORMAL HIGH (ref 135–145)

## 2010-08-30 LAB — BLOOD GAS, ARTERIAL
Acid-base deficit: 1.8 mmol/L (ref 0.0–2.0)
Acid-base deficit: 2.8 mmol/L — ABNORMAL HIGH (ref 0.0–2.0)
Acid-base deficit: 6.3 mmol/L — ABNORMAL HIGH (ref 0.0–2.0)
Bicarbonate: 16.4 mEq/L — ABNORMAL LOW (ref 20.0–24.0)
Bicarbonate: 20.4 mEq/L (ref 20.0–24.0)
Bicarbonate: 21.1 mEq/L (ref 20.0–24.0)
Bicarbonate: 21.9 mEq/L (ref 20.0–24.0)
Bicarbonate: 22.3 mEq/L (ref 20.0–24.0)
Drawn by: 317871
FIO2: 0.3 %
FIO2: 0.3 %
FIO2: 0.4 %
FIO2: 30 %
O2 Saturation: 96.5 %
O2 Saturation: 97.1 %
O2 Saturation: 97.2 %
O2 Saturation: 97.3 %
PEEP: 5 cmH2O
Patient temperature: 98.6
Patient temperature: 98.6
Patient temperature: 98.6
RATE: 14 resp/min
RATE: 14 resp/min
RATE: 20 resp/min
TCO2: 17 mmol/L (ref 0–100)
TCO2: 22 mmol/L (ref 0–100)
TCO2: 23.2 mmol/L (ref 0–100)
pCO2 arterial: 23.3 mmHg — ABNORMAL LOW (ref 35.0–45.0)
pCO2 arterial: 28.8 mmHg — ABNORMAL LOW (ref 35.0–45.0)
pH, Arterial: 7.479 — ABNORMAL HIGH (ref 7.350–7.400)
pO2, Arterial: 111 mmHg — ABNORMAL HIGH (ref 80.0–100.0)
pO2, Arterial: 93.8 mmHg (ref 80.0–100.0)

## 2010-08-30 LAB — PHOSPHORUS
Phosphorus: 1.7 mg/dL — ABNORMAL LOW (ref 2.3–4.6)
Phosphorus: 2.8 mg/dL (ref 2.3–4.6)
Phosphorus: 3.8 mg/dL (ref 2.3–4.6)
Phosphorus: 4.2 mg/dL (ref 2.3–4.6)

## 2010-08-30 LAB — GLUCOSE, CAPILLARY
Glucose-Capillary: 106 mg/dL — ABNORMAL HIGH (ref 70–99)
Glucose-Capillary: 108 mg/dL — ABNORMAL HIGH (ref 70–99)
Glucose-Capillary: 110 mg/dL — ABNORMAL HIGH (ref 70–99)
Glucose-Capillary: 118 mg/dL — ABNORMAL HIGH (ref 70–99)
Glucose-Capillary: 122 mg/dL — ABNORMAL HIGH (ref 70–99)
Glucose-Capillary: 133 mg/dL — ABNORMAL HIGH (ref 70–99)
Glucose-Capillary: 145 mg/dL — ABNORMAL HIGH (ref 70–99)
Glucose-Capillary: 146 mg/dL — ABNORMAL HIGH (ref 70–99)
Glucose-Capillary: 149 mg/dL — ABNORMAL HIGH (ref 70–99)
Glucose-Capillary: 157 mg/dL — ABNORMAL HIGH (ref 70–99)
Glucose-Capillary: 161 mg/dL — ABNORMAL HIGH (ref 70–99)
Glucose-Capillary: 166 mg/dL — ABNORMAL HIGH (ref 70–99)
Glucose-Capillary: 168 mg/dL — ABNORMAL HIGH (ref 70–99)
Glucose-Capillary: 176 mg/dL — ABNORMAL HIGH (ref 70–99)
Glucose-Capillary: 181 mg/dL — ABNORMAL HIGH (ref 70–99)
Glucose-Capillary: 204 mg/dL — ABNORMAL HIGH (ref 70–99)
Glucose-Capillary: 216 mg/dL — ABNORMAL HIGH (ref 70–99)
Glucose-Capillary: 246 mg/dL — ABNORMAL HIGH (ref 70–99)

## 2010-08-30 LAB — CBC
HCT: 30.6 % — ABNORMAL LOW (ref 36.0–46.0)
HCT: 33.4 % — ABNORMAL LOW (ref 36.0–46.0)
HCT: 35.8 % — ABNORMAL LOW (ref 36.0–46.0)
HCT: 37.7 % (ref 36.0–46.0)
Hemoglobin: 10.2 g/dL — ABNORMAL LOW (ref 12.0–15.0)
Hemoglobin: 12.8 g/dL (ref 12.0–15.0)
MCH: 30 pg (ref 26.0–34.0)
MCHC: 33.8 g/dL (ref 30.0–36.0)
MCHC: 34.1 g/dL (ref 30.0–36.0)
MCV: 88.2 fL (ref 78.0–100.0)
MCV: 88.6 fL (ref 78.0–100.0)
MCV: 88.7 fL (ref 78.0–100.0)
Platelets: 12 10*3/uL — CL (ref 150–400)
Platelets: 14 10*3/uL — CL (ref 150–400)
Platelets: 15 10*3/uL — CL (ref 150–400)
Platelets: 21 10*3/uL — CL (ref 150–400)
Platelets: 63 10*3/uL — ABNORMAL LOW (ref 150–400)
RBC: 3.67 MIL/uL — ABNORMAL LOW (ref 3.87–5.11)
RDW: 16.2 % — ABNORMAL HIGH (ref 11.5–15.5)
RDW: 16.5 % — ABNORMAL HIGH (ref 11.5–15.5)
RDW: 16.6 % — ABNORMAL HIGH (ref 11.5–15.5)
RDW: 16.7 % — ABNORMAL HIGH (ref 11.5–15.5)
RDW: 16.7 % — ABNORMAL HIGH (ref 11.5–15.5)
RDW: 17 % — ABNORMAL HIGH (ref 11.5–15.5)
WBC: 10.6 10*3/uL — ABNORMAL HIGH (ref 4.0–10.5)
WBC: 11.1 10*3/uL — ABNORMAL HIGH (ref 4.0–10.5)
WBC: 6.7 10*3/uL (ref 4.0–10.5)
WBC: 7.7 10*3/uL (ref 4.0–10.5)
WBC: 8.2 10*3/uL (ref 4.0–10.5)
WBC: 8.3 10*3/uL (ref 4.0–10.5)
WBC: 8.4 10*3/uL (ref 4.0–10.5)

## 2010-08-30 LAB — HEPATIC FUNCTION PANEL
ALT: 27 U/L (ref 0–35)
AST: 69 U/L — ABNORMAL HIGH (ref 0–37)
Alkaline Phosphatase: 52 U/L (ref 39–117)
Indirect Bilirubin: 0.5 mg/dL (ref 0.3–0.9)
Total Protein: 4.4 g/dL — ABNORMAL LOW (ref 6.0–8.3)

## 2010-08-30 LAB — CROSSMATCH

## 2010-08-30 LAB — COMPREHENSIVE METABOLIC PANEL
Albumin: 1.3 g/dL — ABNORMAL LOW (ref 3.5–5.2)
BUN: 52 mg/dL — ABNORMAL HIGH (ref 6–23)
Calcium: 6.8 mg/dL — ABNORMAL LOW (ref 8.4–10.5)
Creatinine, Ser: 1.28 mg/dL — ABNORMAL HIGH (ref 0.4–1.2)
Glucose, Bld: 208 mg/dL — ABNORMAL HIGH (ref 70–99)
Potassium: 3.5 mEq/L (ref 3.5–5.1)
Total Protein: 3.9 g/dL — ABNORMAL LOW (ref 6.0–8.3)

## 2010-08-30 LAB — CARDIAC PANEL(CRET KIN+CKTOT+MB+TROPI)
CK, MB: 2.4 ng/mL (ref 0.3–4.0)
Relative Index: 0.6 (ref 0.0–2.5)
Relative Index: 0.6 (ref 0.0–2.5)
Relative Index: 0.7 (ref 0.0–2.5)
Total CK: 379 U/L — ABNORMAL HIGH (ref 7–177)
Troponin I: 0.49 ng/mL — ABNORMAL HIGH (ref 0.00–0.06)
Troponin I: 0.68 ng/mL (ref 0.00–0.06)

## 2010-08-30 LAB — DIC (DISSEMINATED INTRAVASCULAR COAGULATION)PANEL
INR: 1.22 (ref 0.00–1.49)
Platelets: 14 10*3/uL — CL (ref 150–400)
Prothrombin Time: 15.3 seconds — ABNORMAL HIGH (ref 11.6–15.2)
Smear Review: NONE SEEN

## 2010-08-30 LAB — CULTURE, BLOOD (ROUTINE X 2)

## 2010-08-30 LAB — URINE MICROSCOPIC-ADD ON

## 2010-08-30 LAB — PREPARE PLATELETS

## 2010-08-30 LAB — PROCALCITONIN
Procalcitonin: 21.88 ng/mL
Procalcitonin: 42.59 ng/mL
Procalcitonin: 91.57 ng/mL

## 2010-08-30 LAB — MAGNESIUM
Magnesium: 1.8 mg/dL (ref 1.5–2.5)
Magnesium: 2 mg/dL (ref 1.5–2.5)

## 2010-08-30 LAB — WOUND CULTURE: Gram Stain: NONE SEEN

## 2010-08-30 LAB — URINE CULTURE: Colony Count: NO GROWTH

## 2010-08-30 LAB — LACTIC ACID, PLASMA: Lactic Acid, Venous: 3.5 mmol/L — ABNORMAL HIGH (ref 0.5–2.2)

## 2010-09-01 LAB — BASIC METABOLIC PANEL
Glucose: 97 mg/dL
Potassium: 4.7 mmol/L (ref 3.4–5.3)

## 2010-09-01 LAB — CBC AND DIFFERENTIAL
HCT: 30 % — AB (ref 36–46)
Hemoglobin: 10 g/dL — AB (ref 12.0–16.0)
Platelets: 124 10*3/uL — AB (ref 150–399)

## 2010-09-01 LAB — HEPATIC FUNCTION PANEL: AST: 43 U/L — AB (ref 13–35)

## 2010-09-01 NOTE — Progress Notes (Signed)
Summary: lab orders  Phone Note Call from Patient Call back at Home Phone [phone]   Caller: Patient Reason for Call: Talk to Nurse Summary of Call: pt needs to talk to a nurse re blood work orders. Initial call taken by: Roe Coombs,  August 25, 2010 11:23 AM  Follow-up for Phone Call        Called patient back-patient to have labs drawn Friday here and she wants to have them drawn in her hometown so she would'nt have to drive so far.  To call back to give phone number of location she wants labs drawn so this nurse can set it up. Follow-up by: Dessie Coma  LPN,  August 25, 2010 11:53 AM  Additional Follow-up for Phone Call Additional follow up Details #1::        Patient called back with phone number to [facility] [phone].  To have fasting lipid/liver drawn there Friday AM and they are to fax results to [place]. Additional Follow-up by: Dessie Coma  LPN,  August 25, 2010 11:55 AM

## 2010-09-07 ENCOUNTER — Encounter: Payer: Self-pay | Admitting: Internal Medicine

## 2010-09-07 ENCOUNTER — Encounter: Payer: Self-pay | Admitting: *Deleted

## 2010-09-16 ENCOUNTER — Ambulatory Visit: Payer: Medicare Other | Admitting: Internal Medicine

## 2010-09-18 LAB — DIFFERENTIAL
Basophils Absolute: 0 10*3/uL (ref 0.0–0.1)
Basophils Absolute: 0 10*3/uL (ref 0.0–0.1)
Basophils Absolute: 0 10*3/uL (ref 0.0–0.1)
Basophils Relative: 0 % (ref 0–1)
Basophils Relative: 0 % (ref 0–1)
Basophils Relative: 0 % (ref 0–1)
Eosinophils Absolute: 0.1 10*3/uL (ref 0.0–0.7)
Eosinophils Relative: 1 % (ref 0–5)
Eosinophils Relative: 2 % (ref 0–5)
Eosinophils Relative: 2 % (ref 0–5)
Lymphocytes Relative: 22 % (ref 12–46)
Lymphocytes Relative: 34 % (ref 12–46)
Lymphs Abs: 1.3 10*3/uL (ref 0.7–4.0)
Monocytes Absolute: 0.4 10*3/uL (ref 0.1–1.0)
Monocytes Absolute: 0.5 10*3/uL (ref 0.1–1.0)
Monocytes Relative: 9 % (ref 3–12)
Neutrophils Relative %: 58 % (ref 43–77)

## 2010-09-18 LAB — BASIC METABOLIC PANEL
CO2: 24 mEq/L (ref 19–32)
GFR calc Af Amer: >60 mL/min (ref >60)
Glucose, Bld: 97 mg/dL (ref 70–99)
Potassium: 4 mEq/L (ref 3.5–5.1)
Sodium: 136 mEq/L (ref 135–145)

## 2010-09-18 LAB — CLOSTRIDIUM DIFFICILE EIA: C difficile Toxins A+B, EIA: NEGATIVE

## 2010-09-18 LAB — CULTURE, BLOOD (ROUTINE X 2)

## 2010-09-18 LAB — CBC
HCT: 23.6 % — ABNORMAL LOW (ref 36.0–46.0)
HCT: 25.1 % — ABNORMAL LOW (ref 36.0–46.0)
HCT: 27.3 % — ABNORMAL LOW (ref 36.0–46.0)
HCT: 27.6 % — ABNORMAL LOW (ref 36.0–46.0)
Hemoglobin: 8 g/dL — ABNORMAL LOW (ref 12.0–15.0)
Hemoglobin: 8.5 g/dL — ABNORMAL LOW (ref 12.0–15.0)
Hemoglobin: 9.1 g/dL — ABNORMAL LOW (ref 12.0–15.0)
MCHC: 33.3 g/dL (ref 30.0–36.0)
MCHC: 33.7 g/dL (ref 30.0–36.0)
MCHC: 33.7 g/dL (ref 30.0–36.0)
MCV: 81.8 fL (ref 78.0–100.0)
MCV: 82.2 fL (ref 78.0–100.0)
MCV: 82.6 fL (ref 78.0–100.0)
Platelets: 145 10*3/uL — ABNORMAL LOW (ref 150–400)
Platelets: 159 10*3/uL (ref 150–400)
Platelets: 180 10*3/uL (ref 150–400)
RBC: 3.02 MIL/uL — ABNORMAL LOW (ref 3.87–5.11)
RBC: 3.09 MIL/uL — ABNORMAL LOW (ref 3.87–5.11)
RDW: 17.2 % — ABNORMAL HIGH (ref 11.5–15.5)
RDW: 17.5 % — ABNORMAL HIGH (ref 11.5–15.5)
RDW: 18.8 % — ABNORMAL HIGH (ref 11.5–15.5)
WBC: 4.4 10*3/uL (ref 4.0–10.5)
WBC: 5.4 10*3/uL (ref 4.0–10.5)
WBC: 6 10*3/uL (ref 4.0–10.5)

## 2010-09-18 LAB — CROSSMATCH

## 2010-09-18 LAB — HEPATIC FUNCTION PANEL
AST: 27 U/L (ref 0–37)
Albumin: 2.8 g/dL — ABNORMAL LOW (ref 3.5–5.2)
Total Protein: 4.9 g/dL — ABNORMAL LOW (ref 6.0–8.3)

## 2010-09-18 LAB — TSH: TSH: 1.317 u[IU]/mL (ref 0.350–4.500)

## 2010-10-01 ENCOUNTER — Encounter: Payer: Self-pay | Admitting: Internal Medicine

## 2010-10-01 ENCOUNTER — Ambulatory Visit (INDEPENDENT_AMBULATORY_CARE_PROVIDER_SITE_OTHER): Payer: Medicare Other | Admitting: Internal Medicine

## 2010-10-01 DIAGNOSIS — R42 Dizziness and giddiness: Secondary | ICD-10-CM

## 2010-10-01 DIAGNOSIS — E639 Nutritional deficiency, unspecified: Secondary | ICD-10-CM

## 2010-10-01 DIAGNOSIS — R011 Cardiac murmur, unspecified: Secondary | ICD-10-CM

## 2010-10-01 DIAGNOSIS — I10 Essential (primary) hypertension: Secondary | ICD-10-CM

## 2010-10-01 DIAGNOSIS — E7400 Glycogen storage disease, unspecified: Secondary | ICD-10-CM

## 2010-10-01 DIAGNOSIS — R55 Syncope and collapse: Secondary | ICD-10-CM

## 2010-10-01 NOTE — Patient Instructions (Signed)
Your physician recommends that you schedule a follow-up appointment in: 3 months in the device clinic  Your physician recommends that you continue on your current medications as directed. Please refer to the Current Medication list given to you today.

## 2010-10-04 ENCOUNTER — Encounter: Payer: Self-pay | Admitting: Internal Medicine

## 2010-10-04 NOTE — Assessment & Plan Note (Signed)
Adequate hydration and nutrition are encouraged

## 2010-10-04 NOTE — Assessment & Plan Note (Signed)
Stable without recent syncope. The patient may have had episodes of afib , though electrograms are not present.  I would like to confirm this with electrograms before given her a diagnosis of afib as episodes could be simply due to artifact.  I have turned electrograms for afib "on".  Given her multiple comorbidities, she would not be a good coumadin candidate at this time.    No other arrhythmias are present.

## 2010-10-04 NOTE — Assessment & Plan Note (Signed)
Stable NO changes today She will follow-up with Dr Shirlee Latch

## 2010-10-04 NOTE — Progress Notes (Signed)
The patient presents today for routine electrophysiology followup.  She is chronically ill and has multiple medical difficulties.  Since last being seen by me, she had a colonic diverticular perforation requiring partial colectomy and prolonged hospitalization.  She has also been treated for systemic amyloidosis.  She denies any further syncope but is frequently weak and lightheaded upon standing. Today, she denies symptoms of palpitations, chest pain, shortness of breath, orthopnea, PND, lower extremity edema,presyncope, syncope, or neurologic sequela.  The patient feels that she is tolerating medications without difficulties and is otherwise without complaint today.   Past Medical History  Diagnosis Date  . History of CEA (carotid endarterectomy) 03/2007    Most recent carotid dopplers 6/10 with 50-69% RICA stenosis, LICA ok s/p CEA.  Marland Kitchen Hypertension   . Hyperlipidemia   . GERD (gastroesophageal reflux disease)   . 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 anemia of chronic kidney disease though renal function does not seem severely impaired.  She had repeat bone marrow biopsy by Dr. Cleone Slim 9/10.  Marland Kitchen Episode of syncope     dysauthonomia/ orthostatic syncope - Patient has Medtronic loop recorder implanted 8/10.  No events so far - May be due only to orthostasis  . 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    . History of MRI 01/2009    CardiacMRI/EF 66%, mild to moderate concentric LVH, RV size & systolic function normAL w/o evidence for ARVC, trivial pericardial effusion/On delayed enhancement, it was difficult to obtain the TI/There was patchy mid-wall basal delayed enhancement/There was partially circumferential subendocardialdelayed enhancement in the mid to apical LV/This was suggestive of infiltrative cardiomyopathy versus  . History of CT scan of chest 01/2009    to assess for sarcoidosis: no evidence by CT for pulmonary  sarcoid  . 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   . Secretory diarrhea      treated with octreotide.   . Esophageal stricture 05/2010    with dilation  . Amyloidosis    Past Surgical History  Procedure Date  . Implantable loop recorder 2010    MDT for unexplained syncope  . Partial colectomy 1/11    for diverticular perforation  . Carotid endarterectomy 10/08    left  . Abdominal hysterectomy     Current Outpatient Prescriptions  Medication Sig Dispense Refill  . amitriptyline (ELAVIL) 25 MG tablet Take 25 mg by mouth at bedtime.        Marland Kitchen atorvastatin (LIPITOR) 40 MG tablet Take 40 mg by mouth daily.        . ergocalciferol (VITAMIN D2) 50000 UNITS capsule Take 50,000 Units by mouth once a week.        . folic acid (FOLVITE) 1 MG tablet Take 1 mg by mouth daily.        . hydrocodone-acetaminophen (LORCET-HD) 5-500 MG per capsule Take 1 capsule by mouth every 4 (four) hours as needed.        Marland Kitchen lisinopril (PRINIVIL,ZESTRIL) 5 MG tablet       . LYRICA 50 MG capsule As needed      . megestrol (MEGACE) 40 MG/ML suspension 1 teaspoon thirty min before each meal      . metoprolol succinate (TOPROL-XL) 25 MG 24 hr tablet Take 25 mg by mouth daily. 1 tab qd       .  octreotide (SANDOSTATIN) 1000 MCG/ML SOLN Inject into the skin every 12 (twelve) hours. Use as directed       . ondansetron (ZOFRAN) 8 MG tablet Take 8 mg by mouth every 8 (eight) hours as needed.        . ondansetron (ZOFRAN) 8 MG tablet Take by mouth every 8 (eight) hours as needed.        . pantoprazole (PROTONIX) 40 MG tablet Take 40 mg by mouth 2 (two) times daily.        Marland Kitchen zolpidem (AMBIEN) 10 MG tablet Take 10 mg by mouth at bedtime as needed.        . ciprofloxacin (CIPRO) 250 MG tablet Take 250 mg by mouth 2 (two) times daily.          No Known Allergies  History   Social History  . Marital Status: Divorced     Spouse Name: N/A    Number of Children: N/A  . Years of Education: N/A   Occupational History  . Not on file.   Social History Main Topics  . Smoking status: Former Smoker -- 1.0 packs/day    Types: Cigarettes    Quit date: 09/07/1978  . Smokeless tobacco: Never Used  . Alcohol Use: No  . Drug Use: No  . Sexually Active: Not on file   Other Topics Concern  . Not on file   Social History Narrative   Quit tobacco in 1980.  No ETOH or drugs.  Single, lives in [place], 1 child (daughter in [place]).  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     Family History  Problem Relation Age of Onset  . Heart failure Mother   . Heart failure Father     ROS-  All systems are reviewed and are negative except as outlined in the HPI above  Physical Exam: Filed Vitals:   10/01/10 1004  BP: 124/72  Pulse: 90  Height: 5\' 3"  (1.6 m)  Weight: 103 lb (46.72 kg)    GEN- frail and chronically ill, NAD, alert and oriented x 3 today.   Head- normocephalic, atraumatic Eyes-  Sclera clear, conjunctiva pink Ears- hearing intact Oropharynx- clear Neck- supple, no JVP Lymph- no cervical lymphadenopathy Lungs- Clear to ausculation bilaterally, normal work of breathing Heart- Regular rate and rhythm, 2/6 SEM Chest- ILR site is well healed GI- soft, NT, ND, + BS Extremities- no clubbing, cyanosis, trace edema MS- no significant deformity or atrophy Skin- no rash or lesion Psych- euthymic mood, full affect Neuro- strength and sensation are intact  ILR interrogation: The patient may have had episodes of afib , though electrograms are not present.  I would like to confirm this with electrograms before given her a diagnosis of afib as episodes could be simply due to artifact.  I have turned electrograms for afib "on".  Given her multiple comorbidities, she would not be a good coumadin candidate at this time.    Assessment and Plan:

## 2010-10-04 NOTE — Assessment & Plan Note (Signed)
Stable. No changes today. 

## 2010-10-06 ENCOUNTER — Telehealth: Payer: Self-pay | Admitting: Internal Medicine

## 2010-10-06 NOTE — Telephone Encounter (Signed)
Needs battery for her loop recorder checker/glc

## 2010-10-08 NOTE — Telephone Encounter (Signed)
Battery for activator is N.  Pt aware of size.  [facility]commended going to [facility] but pt lives in [place] and [facility] is [facility].  [facility] to [facility] or [facility].

## 2010-10-21 ENCOUNTER — Ambulatory Visit: Payer: Medicare Other | Admitting: Cardiology

## 2010-10-23 ENCOUNTER — Telehealth: Payer: Self-pay | Admitting: *Deleted

## 2010-10-23 ENCOUNTER — Encounter: Payer: Self-pay | Admitting: Cardiology

## 2010-10-23 ENCOUNTER — Ambulatory Visit (INDEPENDENT_AMBULATORY_CARE_PROVIDER_SITE_OTHER): Payer: Medicare Other | Admitting: Cardiology

## 2010-10-23 DIAGNOSIS — I428 Other cardiomyopathies: Secondary | ICD-10-CM

## 2010-10-23 DIAGNOSIS — R42 Dizziness and giddiness: Secondary | ICD-10-CM

## 2010-10-23 DIAGNOSIS — E7400 Glycogen storage disease, unspecified: Secondary | ICD-10-CM

## 2010-10-23 DIAGNOSIS — I6529 Occlusion and stenosis of unspecified carotid artery: Secondary | ICD-10-CM

## 2010-10-23 DIAGNOSIS — E785 Hyperlipidemia, unspecified: Secondary | ICD-10-CM

## 2010-10-23 DIAGNOSIS — I10 Essential (primary) hypertension: Secondary | ICD-10-CM

## 2010-10-23 DIAGNOSIS — E639 Nutritional deficiency, unspecified: Secondary | ICD-10-CM

## 2010-10-23 NOTE — Telephone Encounter (Signed)
This was done in error.

## 2010-10-23 NOTE — Patient Instructions (Signed)
Take Aspirin 81mg  daily--this should be enteric coated.  Schedule an appointment for an echocardiogram.  Schedule an appointment for a carotid doppler.  Your physician wants you to follow-up in: 6 months with Dr Shirlee Latch.(October 2013). You will receive a reminder letter in the mail two months in advance. If you don't receive a letter, please call our office to schedule the follow-up appointment.

## 2010-10-25 DIAGNOSIS — I6529 Occlusion and stenosis of unspecified carotid artery: Secondary | ICD-10-CM | POA: Insufficient documentation

## 2010-10-25 NOTE — Assessment & Plan Note (Signed)
Lipids at goal (LDL < 70 given vascular disease).

## 2010-10-25 NOTE — Assessment & Plan Note (Signed)
Continue Toprol XL and lisinopril.  Orthostatic symptoms are manageable currently.

## 2010-10-25 NOTE — Assessment & Plan Note (Signed)
Will get carotid ultrasound to reassess.  Had prior CEA.  She should take ASA 81 mg daily.

## 2010-10-25 NOTE — Assessment & Plan Note (Signed)
Minimal symptoms now.  No further syncope.  Still has loop recorder in and will followup with Dr. Johney Frame.

## 2010-10-25 NOTE — Assessment & Plan Note (Signed)
Patient has a cardiomyopathy with preserved LV systolic function.  Delayed enhancement on MRI suggested an infiltrative disease or hypertrophic cardiomyopathy rather than coronary disease, and she has had a negative myoview.  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  This was confirmed by testing at Dr. Bertha Stakes office as well.  CT chest was not suggestive of pulmonary sarcoidosis. Given the finding of monoclonal light chains and suggestive MRI findings, she was treated for amyloidosis by Dr. Cleone Slim with Velcade and dexamethasone.  Unfortunately, she developed severe diarrhea and had 2 bowel perforations. Therefore, she is off treatment now.   Echo in 4/11 showed stable LV mid-cavity gradient.  - She does not appear significantly volume overloaded today.  No Lasix as she does not appear overloaded and this could worsen her mid-cavity gradient and orthostasis.  - Continue Toprol XL 25 mg daily as this may help decrease LV mid-cavity gradient.   - I will repeat an echo to reassess her mid-cavity gradient.

## 2010-10-25 NOTE — Progress Notes (Signed)
PCP: Paulene Floor, [facility]  70 yo with history of cardiomyopathy (probably cardiac amyloidosis) and syncope returns for followup.  She underwent treatment with Velcade and dexamethasone for systemic amyloidosis.  Monoclonal light chain count fell with treatment, but she developed intractable diarrhea with bowel perforations in 1/11 and 7/11.  She has had a partial colectomy.  She was in a SNF for 5 months but is home again.  Her weight is increasing (up 10 lbs since last appointment).  Echo in 4/11 showed severe LV hypertrophy with stable LV mid-cavity gradient with valsalva.    Last hospitalization was in 2/12 for partial small bowel obstruction that was managed medically.  She is following with Dr Cleone Slim and says that her light chains have not increased.  She is being bothered a lot by sciatica-type pain in the right leg.  No falls.  1 questionable syncopal episode recently: she found herself sitting on her sofa one day and is not sure how she got there.  Still some lightheadedness with standing but stands slowly to avoid severe symptoms.  She is short of breath with vacuuming but can walk for 15-20 minutes at a time before stopping.  No chest pain.   ECG: NSR, left axis deviation  Labs (7/10): LDL 87, HDL 52, ALT 46, AST 33 Labs (8/10): K 4.4, creatinine 1.0, BNP 119, HCT 30.6, SPEP negative, UPEP positive for monoclonal lambda light chains.  Labs (9/10): creatinine 0.93, C diff negative  Labs (4/11): HDL 58, LDL 127, K 3.5, creatinine 0.8 Labs (3/12): K 4.9, creatinine 1.04, LDL 69, HDL 45  Allergies (verified):  No Known Drug Allergies  Past Medical History: 1.  Left CEA (10/08).  Most recent carotid dopplers 6/10 with 50-69% RICA stenosis, LICA ok s/p CEA.  2.  HTN 3.  GERD 4.  Hyperlipidemia 5.  Anemia:  She has been on Aranesp for ? anemia of chronic kidney disease though renal function does not seem severely impaired.  She had repeat bone marrow biopsy by Dr.  Cleone Slim 9/10. 6.  s/p hysterectomy 7.  Mid-cavity dynamic LV gradient:  Echo (4/11) with EF 65-70%, severe concentric LVH, peak LV mid-cavity gradient reaching 45 mmHg with valsalva, grade I diastolic dysfunction, RV hypertrophy, small pericardial effusion. 8.  Adenosine myoview (5/10):  No evidence for ischemia or infarction.  9.  Episodes of syncope and presyncope.  - Patient has Medtronic loop recorder implanted 8/10.  No events so far - May be due only to orthostasis 10. 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   - Cardiac MRI (8/10): EF 66%, mild to moderate concentric LVH, RV size and systolic function normal with no evidence for ARVC, trivial pericardial effusion.  On delayed enhancement, it was difficult to obtain the TI.  There was patchy mid-wall basal delayed enhancement.  There was partially circumferential subendocardial delayed enhancement in the mid to apical LV.  This was suggestive of infiltrative cardiomyopathy versus possible HCM.  - SPEP negative.  UPEP positive for monoclonal lambda light chains.  Monoclonal light chains confirmed on testing by Dr. Cleone Slim.  - CT chest (8/10) to assess for sarcoidosis: no evidence by CT for pulmonary sarcoid.  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  11.  C. difficile colitis with orthostatic hypotension 12. Diverticulitis (1/11) with perforation and colectomy/colostomy.  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  14. Esophageal  stricture with dilation 12/11  Family History: Mother died suddenly with presumed MI at 57.  Father died suddenly with presumed MI at age 80.  Cousin died suddenly, ? age.   Social History: Quit tobacco in 1980.  No ETOH or drugs.  Single, lives in [place], 1 child (daughter in [place]).  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   Review of Systems         All systems reviewed and negative except as per HPI.   Current Outpatient Prescriptions  Medication Sig Dispense Refill  . amitriptyline (ELAVIL) 25 MG tablet Take 25 mg by mouth at bedtime.        Marland Kitchen atorvastatin (LIPITOR) 40 MG tablet Take 40 mg by mouth daily.        . ergocalciferol (VITAMIN D2) 50000 UNITS capsule Take 50,000 Units by mouth once a week.        . folic acid (FOLVITE) 1 MG tablet Take 1 mg by mouth daily.        . hydrocodone-acetaminophen (LORCET-HD) 5-500 MG per capsule Take 1 capsule by mouth every 4 (four) hours as needed.        Marland Kitchen lisinopril (PRINIVIL,ZESTRIL) 5 MG tablet 5 mg daily.       Marland Kitchen LYRICA 50 MG capsule As needed      . megestrol (MEGACE) 40 MG/ML suspension 1 teaspoon thirty min before each meal      . metoprolol succinate (TOPROL-XL) 25 MG 24 hr tablet Take 25 mg by mouth daily. 1 tab qd       . octreotide (SANDOSTATIN) 1000 MCG/ML SOLN Inject into the skin every 12 (twelve) hours. Use as directed       . ondansetron (ZOFRAN) 8 MG tablet Take 8 mg by mouth every 8 (eight) hours as needed.        . pantoprazole (PROTONIX) 40 MG tablet Take 40 mg by mouth 2 (two) times daily.        Marland Kitchen zolpidem (AMBIEN) 10 MG tablet Take 10 mg by mouth at bedtime as needed.        Marland Kitchen aspirin EC 81 MG tablet Take 1 tablet (81 mg total) by mouth daily.        BP 140/80  Pulse 74  Ht 5\' 3"  (1.6 m)  Wt 103 lb (46.72 kg)  BMI 18.25 kg/m2 General:  Thin, no apparent distress Neck:  Neck supple, no JVD. No masses, thyromegaly or abnormal cervical nodes. Lungs:  Clear bilaterally to auscultation and percussion. Heart:  Non-displaced PMI, chest non-tender; regular rate and rhythm, S1, S2, 2/6 systolic crescendo-decrescendo mid-peaking systolic murmur along sternal border. No S3/S4.  Bilateral carotid bruits. Pedals normal pulses. No edema.  Abdomen:  Bowel sounds positive; abdomen soft and non-tender without masses, organomegaly, or hernias noted. No hepatosplenomegaly.  Extremities:  No clubbing or cyanosis. Neurologic:  Alert and oriented x 3. Psych:  Normal affect.

## 2010-10-27 NOTE — Discharge Summary (Signed)
NAMEJOSSELINE, [name]               ACCOUNT NO.:  [id number]   MEDICAL RECORD NO.:  [id number]          PATIENT TYPE:  INP   LOCATION:  3312                         FACILITY:  [facility]   PHYSICIAN:  Di Kindle. Edilia Bo, M.D.DATE OF BIRTH:  Oct 27, 1940   DATE OF ADMISSION:  03/22/2007  DATE OF DISCHARGE:  03/23/2007                               DISCHARGE SUMMARY   BRIEF HISTORY:  This is a 70 year old woman who was noted to have  bilateral carotid bruits by Dr. Rudi Heap.  This prompted a Duplex  scan at [facility] and showed a greater than 80% left carotid  stenosis.  I recommended left carotid endarterectomy in order to lower  her risk of future stroke.  She denied any previous history of stroke,  TIAs, expressive or receptive aphasia or amaurosis fugax.  The remainder  of her medical history is as dictated on her vascular surgery  consultation dated March 14, 2007.   HOSPITAL COURSE:  The patient was admitted on March 22, 2007.  She  underwent an uneventful left carotid endarterectomy with Dacron patch  angioplasty on March 22, 2007.  Of note, the bifurcation of the  carotid was high and the stenosis extended quite high and this required  fairly high dissection, however, I was able to get the plaque out  completely and she did well postoperatively and awoke with no neurologic  deficit.  She was monitored in the step-down unit overnight where she  remained neurologically intact with no significant problems.  Her blood  pressure remained stable.  By postoperative day #1, she was ambulating,  eating and voiding.  Neurologic exam was unremarkable.  Incision looked  fine.  She was ready for discharge.  She was discharged home on March 23, 2007.   DISCHARGE DIAGNOSIS:  Asymptomatic greater than 80% left carotid  stenosis.   ADDITIONAL/SECONDARY DIAGNOSES:  1. History of anemia.  She had recently been transfused for hemoglobin      of 7.7.  2. Hypertension.  3.  Hypercholesterolemia.   CONDITION ON DISCHARGE:  Good.   Discharge is to home.   MEDICATIONS ON DISCHARGE:  1. Vytorin 10/40 one p.o. daily.  2. Folic acid 1 mg p.o. daily.  3. Aspirin 81 mg p.o. daily.  4. Lisinopril/hydrochlorothiazide 20/12 p.o. daily.  5. Ferrous sulfate 224 mg p.o. daily.  6. Multivitamin one p.o. daily.      Di Kindle. Edilia Bo, M.D.  Electronically Signed     CSD/MEDQ  D:  03/23/2007  T:  03/23/2007  Job:  [id number]   cc:   Ernestina Penna, M.D.

## 2010-10-27 NOTE — Assessment & Plan Note (Signed)
OFFICE VISIT   DEBBE, CRUMBLE  DOB:  1941-06-02                                       04/04/2007  CHART#:[id number]   I saw Ms. [name] in the office today for continued follow-up after her  recent left carotid endarterectomy.  This is a pleasant 70 year old  woman who was noted to have bilateral carotid bruits by her primary care  physician, Dr. Christell Constant.  This prompted a duplex scan which showed a  greater than 80% left carotid stenosis.  Left carotid endarterectomy was  recommended and wanted to lower her risk of future stroke.  She  underwent a left carotid endarterectomy with Dacron patch angioplasty on  03/22/07.  She did well postoperatively and was discharged on  postoperative day #1.  She returns for her first follow-up visit.  Since  I saw her last she has had no history of significant weakness or  paresthesias in her upper or lower extremities.  She has had no  expressive or receptive aphagia.  No amaurosis fugax.   PHYSICAL EXAMINATION:  VITALS:  Blood pressure is 134/87, heart rate is  95.  GENERAL:  Her incision is healing nicely.  NEUROLOGIC EXAM:  Nonfocal.  EXTREMITIES:  She has good strength in her upper extremities and lower  extremities bilaterally.   Overall I am pleased with her progress and I will see her back in 6  months for a follow-up duplex scan.  She does have a moderate 60% to 79%  right carotid stenosis which we will continue to follow. She does know  to stay on her aspirin.   Di Kindle. Edilia Bo, M.D.  Electronically Signed   CSD/MEDQ  D:  04/04/2007  T:  04/06/2007  Job:  [id number]

## 2010-10-27 NOTE — Procedures (Signed)
CAROTID DUPLEX EXAM   INDICATION:  Left carotid stenosis by Doppler evaluation done at  [facility] on 02/17/2007.   HISTORY:  Diabetes:  No.  Cardiac:  No.  Hypertension:  Yes.  Smoking:  No.  Previous Surgery:  No.  CV History:  Amaurosis Fugax No, Paresthesias No, Hemiparesis No                                       RIGHT             LEFT  Brachial systolic pressure:         150               140  Brachial Doppler waveforms:         Triphasic         Triphasic  Vertebral direction of flow:        Antegrade         Antegrade  DUPLEX VELOCITIES (cm/sec)  CCA peak systolic                   144               131  ECA peak systolic                   165               135  ICA peak systolic                   207               322  ICA end diastolic                   40                137  PLAQUE MORPHOLOGY:                  Calcified         Calcified  PLAQUE AMOUNT:                      Moderate to large Large  PLAQUE LOCATION:                    CCA, ICA, ECA     CCA, ICA, ECA   IMPRESSION:  1. A 80-99% left internal carotid artery stenosis.  2. A 60-79% right internal carotid artery stenosis.  3. Bilateral external carotid artery stenosis.   ___________________________________________  Di Kindle. Edilia Bo, M.D.   DP/MEDQ  D:  03/14/2007  T:  03/14/2007  Job:  [id number]

## 2010-10-27 NOTE — Op Note (Signed)
NAMEMAYLING, ABER               ACCOUNT NO.:  [id number]   MEDICAL RECORD NO.:  [id number]          PATIENT TYPE:  OIB   LOCATION:  2899                         FACILITY:  [facility]   PHYSICIAN:  Hillis Range, MD       DATE OF BIRTH:  Aug 22, 1940   DATE OF PROCEDURE:  DATE OF DISCHARGE:                               OPERATIVE REPORT   SURGEON:  Hillis Range, MD   PREPROCEDURE DIAGNOSIS:  Unexplained recurrent syncope.   POSTPROCEDURE DIAGNOSES:  Unexplained recurrent syncope.   PROCEDURE:  Implantable loop recorder implantation.   HISTORY OF PRESENT ILLNESS:  Ms. Rhue is a pleasant 70 year old  female with a history of hypertension and recurrent unexplained syncope  who presents today for implantable loop recorder implantation.  She has  had multiple episodes of syncope of an unclear etiology.  Most of her  episodes occur while standing without precipitating symptoms.  She has  had frequent episodes of orthostatic dizziness and presyncope also.  She  has a family history of sudden cardiac death.  She has a preserved  ejection fraction but a recent cardiac MRI revealed a possible  infiltrated myocardial pattern.  She therefore presents today for  implantable loop recorder implantation.   DESCRIPTION OF PROCEDURE:  Informed written consent was obtained and the  patient was brought to the Electrophysiology Lab in the fasting state.  She was adequately sedated with intravenous Versed as outlined in the  nursing report.  The patient's entire chest was prepped and draped in  the usual sterile fashion by the EP lab staff.  Mapping of the chest  wall to obtain an adequate positioning for the loop recorder was  performed by the [employer] representative.  The region over the left  parasternal region approximately 2 cm along the third intercostal space  was found.  The chest was therefore prepped and draped by the EP lab  staff in this area.  The skin overlying this region was infiltrated  with  lidocaine for local analgesia.  A 1.5 cm horizontal incision was made  along the left parasternal region approximately 2 cm from the sternum at  the level of the third intercostal space.  A subcutaneous Reveal pocket  was fashioned using a combination of sharp and blunt dissection.  Electrocautery was required to assure hemostasis.  A Medtronic Reveal XT  model [id number] (serial number [id number]) device was placed into the left  subcutaneous pocket and secured to the pocket with two separate #2 silk  sutures.  The pocket was irrigated with copious gentamicin solution.  The pocket was then closed in two layers with 2.0 Vicryl suture for the  subcutaneous and subcuticular layers.  Steri-Strips and a sterile  dressing were then applied.  The device was then interrogated and  adequate P-wave and R-waves were observed.  There were no early apparent  complications.   CONCLUSIONS:  1. Recurrent unexplained syncope.  2. Successful implantation of a Reveal XT implantable loop recorder.  3. No early apparent complications.      Hillis Range, MD  Electronically Signed     JA/MEDQ  D:  01/31/2009  T:  02/01/2009  Job:  [id number]   cc:   Marca Ancona, MD

## 2010-10-27 NOTE — Op Note (Signed)
[name], [name]               ACCOUNT NO.:  [id number]   MEDICAL RECORD NO.:  [id number]          PATIENT TYPE:  INP   LOCATION:  2853                         FACILITY:  [facility]   PHYSICIAN:  Di Kindle. Edilia Bo, M.D.DATE OF BIRTH:  November 12, 1940   DATE OF PROCEDURE:  03/22/2007  DATE OF DISCHARGE:                               OPERATIVE REPORT   PREOPERATIVE DIAGNOSIS:  Greater than 80% left carotid stenosis.   POSTOPERATIVE DIAGNOSIS:  Greater than 80% left carotid stenosis.   PROCEDURE:  Left carotid endarterectomy with Dacron patch angioplasty.   SURGEON:  Di Kindle. Edilia Bo, M.D.   ASSISTANT:  1. Charlena Cross, M.D.   ANESTHESIA:  General.   ESTIMATED BLOOD LOSS:  150 mL.   INDICATIONS:  This is a 70 year old woman who was noted to have  bilateral carotid bruits by her primary care physician, Dr. Christell Constant. This  prompted a duplex scan which showed a greater than 80% left carotid  stenosis.  It was noted the bifurcation was somewhat high and the  stenosis was in the mid segment of the artery but appeared to be  surgically accessible.  Left carotid endarterectomy was recommended in  order to lower her risk of future stroke.  The procedure and potential  complications including but not limited to bleeding, stroke (peri-  procedure risk 1-2%), nerve injury, MI, or other unpredictable medical  problems were discussed with the patient preoperatively.  All her  questions were answered and she was agreeable to proceed.   TECHNIQUE:  The patient was taken to the operating room and received a  general anesthetic.  An arterial line had been placed by anesthesia.  The left neck was prepped and draped in the usual sterile fashion.  An  incision was made along the anterior border of the sternocleidomastoid  and dissection carried down to the common carotid artery which was  dissected free and controlled with a Rumel tourniquet.  Of note, the  artery was somewhat small.  Next,  the external carotid artery was  controlled with a blue vessel loop and the internal carotid artery was  dissected free after the facial vein was divided between silk ties.  Of  note, the bifurcation was high and the plaque extended very high up the  internal carotid artery.  I had to fully mobilize the hypoglossal nerve  and retract on the digastric to allow exposure of a small internal  carotid artery above the plaque.  I was able to get above the plaque.   The patient was then heparinized.  Clamps then placed on the internal,  then the external, then the common carotid artery.  A longitudinal  arteriotomy was made in the common carotid artery and extended up  through the plaque into the internal carotid artery.  A 10 shunt would  not fit easily and I, therefore, placed an 8 shunt.  This was placed  into the internal carotid artery, back bled, and then placed in the  common carotid artery and secured with a Rumel tourniquet.  Flow was re-  established to the shunt.  Flow to the  shunt was documented with the  Doppler.  An endarterectomy plane was established proximally and the  plaque was sharply divided.  Eversion endarterectomy was performed of  the external carotid artery.  Distally, there was a nice taper in the  plaque and no tacking sutures were required.  The artery was irrigated  with copious amounts of heparin and dextran and all loose debris  removed.  The Dacron patch was then sewn using continuous 6-0 Prolene  suture.  Prior to completing the patch closure, the shunt was removed,  the arteries back bled and flushed appropriately, and the anastomosis  completed.  Flow was re-established to the external carotid artery and  into the internal carotid artery.  Several repair sutures were placed  for bleeding.  Hemostasis was obtained using pressure and Gelfoam and  thrombin.   Once hemostasis was obtained, the deep layer was closed with running 3-0  Vicryl.  The platysma was  closed with running 3-0 Vicryl.  The skin was  closed with 4-0 subcuticular stitch.  A sterile dressing was applied.  The patient tolerated the procedure well and was transferred to the  recovery room in satisfactory condition.  All needle and sponge counts  were correct.      Di Kindle. Edilia Bo, M.D.  Electronically Signed     CSD/MEDQ  D:  03/22/2007  T:  03/22/2007  Job:  [id number]

## 2010-10-27 NOTE — Consult Note (Signed)
VASCULAR SURGERY CONSULTATION   [name]  DOB:  11-Dec-1940                                       03/14/2007  CHART#:[id number]   This report will also function as the patient's history and physical.   REASON FOR CONSULTATION/ADMISSION:  Greater than 80% left carotid  stenosis.   HISTORY:  This is a pleasant 70 year old right-handed woman who was  noted to have bilateral carotid bruits.  This prompted a duplex scan at  [facility] which showed a greater than 80% left carotid stenosis  with a moderate right carotid stenosis.  She was sent for vascular  consultation.  Of note, she denies any history of stroke, TIAs,  expressive or receptive aphasia or amaurosis fugax.  She has had no  problems with dizziness.   Her past medical history is significant for anemia.  She has been  transfused recently for a hemoglobin of 7.7.  She believes that her  follow-up hemoglobin was 11.  She also has hypertension and  hypercholesterolemia.  She denies any history of diabetes, history of  previous myocardial infarction, history of congestive heart failure, or  history of COPD.   FAMILY HISTORY:  Mother had a heart attack at age 72.  She is unaware of  any other history of premature cardiovascular disease.   SOCIAL HISTORY:  She is single.  She has one child.  She quit tobacco in  1980.   ALLERGIES:  No known drug allergies.   MEDICATIONS:  1. Vytorin 10/40 1 p.o. daily.  2. Folic acid 1 mg p.o. daily.  3. Aspirin 81 mg p.o. daily.  4. Lisinopril/hydrochlorothiazide 20/12 p.o. daily.  5. Ferrous sulfate 224 mg p.o. daily.  6. Multivitamin p.o. daily.   REVIEW OF SYSTEMS:  GENERAL:  She has had no weight loss, weight gain,  problems with her appetite or fever.  CARDIAC:  She has had no chest  pain, chest pressure, palpitations, or arrhythmias.  PULMONARY:  She has  had no productive cough, bronchitis, asthma, or wheezing.  GI:  She has  had no recent change  in her bowel habits and has no history of peptic  ulcer disease.  GU:  She has had no dysuria or frequency.  VASCULAR:  She has had no claudication, rest pain, nonhealing ulcers, DVT, or  phlebitis.  NEURO:  She has had no dizziness, blackouts, headaches, or  seizures.  ORTHO/SKIN:  She has had no arthritis, joint pain, muscle  pain, or rash.  HEMATOLOGIC:  She has had a history of anemia, as  described above.  She has had no bleeding problems that she is aware of.   PHYSICAL EXAMINATION:  Blood pressure is 168/86 on the left and 143/83  on the right.  Heart rate is 77.  Temperature is 98.  HEENT:  She has  soft bilateral carotid bruits.  Lungs are clear bilaterally to  auscultation.  On cardiac exam, she has a regular rate and rhythm.  Abdomen is soft and nontender.  I cannot palpate an aneurysm.  She has  palpable femoral and pedal pulses bilaterally.  Neurologic exam is  nonfocal.  She has good motor strength in her upper and lower  extremities bilaterally.   Duplex scan in our office today shows a greater than 80% left carotid  stenosis.  This bifurcation looks somewhat high, but we were  able to see  past the stenosis.  She has a 60-79% stenosis on the right.   Although she is asymptomatic, given that the stenosis on the left is  greater than 80%, I have recommended a left carotid endarterectomy in  order to lower her risk of future stroke.  We have discussed the  indications for the procedure and the potential complications, including  but not limited to bleeding, stroke (periprocedural risk of 1-2%), nerve  injury, MI, or other unpredictable medical problems.  All of her  questions were answered, and she is agreeable to proceed.  Her surgery  has been scheduled for March 22, 2007.  She knows to continue her  aspirin right up through surgery.   [name], M.D.  Electronically Signed  CSD/MEDQ  D:  03/14/2007  T:  03/15/2007  Job:  [id number]   cc:   [name],  M.D.

## 2010-10-27 NOTE — Procedures (Signed)
CAROTID DUPLEX EXAM   INDICATION:  Followup, carotid artery surgery.   HISTORY:  Diabetes:  No.  Cardiac:  No.  Hypertension:  Yes.  Smoking:  No.  Previous Surgery:  Left carotid endarterectomy with DPA on 03/22/07 by  Dr. Edilia Bo.  CV History:  Amaurosis Fugax No, Paresthesias No, Hemiparesis No                                       RIGHT             LEFT  Brachial systolic pressure:         130               120  Brachial Doppler waveforms:         Biphasic          Biphasic  Vertebral direction of flow:        Antegrade         Antegrade  DUPLEX VELOCITIES (cm/sec)  CCA peak systolic                   118               117  ECA peak systolic                   137               119  ICA peak systolic                   158               66  ICA end diastolic                   56                29  PLAQUE MORPHOLOGY:                  Mixed             Calcified  PLAQUE AMOUNT:                      Moderate          Moderate  PLAQUE LOCATION:                    ICA, ECA          ECA   IMPRESSION:  1. Mild bilateral external carotid artery stenoses.  2. 40-59% right internal carotid artery stenosis.  3. No left internal carotid artery stenosis, status post      endarterectomy.   ___________________________________________  Di Kindle. Edilia Bo, M.D.   DP/MEDQ  D:  10/10/2007  T:  10/10/2007  Job:  [id number]

## 2010-10-30 NOTE — Assessment & Plan Note (Signed)
[facility]                           GASTROENTEROLOGY OFFICE NOTE   CLAUDIA, GREENLEY                        MRN:          [id number]  DATE:03/30/2006                            DOB:          1940/06/30    REASON FOR REFERRAL:  Microcytic anemia and GERD.   HISTORY OF PRESENT ILLNESS:  Ms. [name] is a 70 year old African-American  female referred through the courtesy of Dr. Montey Hora.  She has noted  symptoms of fatigue recently and saw Dr. Dimple Casey on September 20.  CBC revealed  a white blood cell count of 3.9, hemoglobin of 10.4, and MCV of 77.5.  Platelet count was normal at 192,000.  Stool hemoccults were completed by  the patient and the results were pending at the time of this dictation.  She  states she has had problems with acid reflux and upper abdominal bloating  following meals for many years.  She underwent upper endoscopy and  colonoscopy in [place] in 2000 and states acid reflux was found and the  colonoscopy was normal.  She used prescription-strength Tagamet for a short  amount of time which did relieve her symptoms and then she discontinued it.  She notes no abdominal pain, dysphagia, odynophagia, weight loss, change in  stool caliber, melena, or hematochezia.  No first or second degree relatives  with colon cancer, colon polyps, or inflammatory bowel disease.   PAST MEDICAL HISTORY:  1. Hypertension.  2. Hyperlipidemia.  3. GERD.  4. Status post hysterectomy.   MEDICATIONS:  1. Lisinopril/hydrochlorothiazide 20/12.5 daily.  2. Vytorin 10/40 daily.   MEDICATION ALLERGIES:  None known.   SOCIAL HISTORY AND REVIEW OF SYSTEMS:  Per the handwritten form.   PHYSICAL EXAMINATION:  GENERAL:  No acute distress.  VITAL SIGNS:  Height 5 feet 5 inches, weight 147 pounds.  Blood pressure is  118/70, pulse 64 and regular.  HEENT:  Anicteric sclerae, oropharynx clear.  CHEST:  Clear to auscultation bilaterally.  CARDIAC:  Regular  rate and rhythm without murmurs.  ABDOMEN:  Soft, nontender, nondistended, normal active bowel sounds.  No  palpable organomegaly, masses, or hernias.  RECTAL:  Deferred at time of colonoscopy.  EXTREMITIES:  Without clubbing, cyanosis, or edema.  NEUROLOGIC:  Alert and oriented x3.  Grossly nonfocal.   ASSESSMENT AND PLAN:  Microcytic anemia and reflux symptoms.  I suspect this  is iron deficiency.  Rule out occult gastrointestinal losses.  Await recent  stool hemoccults.  Begin Prilosec 20 mg p.o. q.a.m. along with standard  antireflux measures.  Obtain iron, iron binding, ferritin, B12, and folate  today.  Risks, benefits, and alternatives to colonoscopy with possible  biopsy and possible polypectomy, as well as upper endoscopy with possible  biopsy, discussed with the patient and she consents to proceed.  This will  be scheduled electively.       Venita Lick. Russella Dar, MD, Clementeen Graham      MTS/MedQ  DD:  03/30/2006  DT:  03/31/2006  Job #:  [id number]   cc:   [name]. Rice, M.D.

## 2010-11-05 ENCOUNTER — Encounter (INDEPENDENT_AMBULATORY_CARE_PROVIDER_SITE_OTHER): Payer: Medicare Other | Admitting: *Deleted

## 2010-11-05 ENCOUNTER — Telehealth: Payer: Self-pay | Admitting: Cardiology

## 2010-11-05 ENCOUNTER — Ambulatory Visit (HOSPITAL_COMMUNITY): Payer: Medicare Other | Attending: Cardiology

## 2010-11-05 DIAGNOSIS — I079 Rheumatic tricuspid valve disease, unspecified: Secondary | ICD-10-CM | POA: Insufficient documentation

## 2010-11-05 DIAGNOSIS — I428 Other cardiomyopathies: Secondary | ICD-10-CM | POA: Insufficient documentation

## 2010-11-05 DIAGNOSIS — I6529 Occlusion and stenosis of unspecified carotid artery: Secondary | ICD-10-CM

## 2010-11-05 NOTE — Telephone Encounter (Signed)
I talked with pt. Pt would like a copy of her echo and carotid done today 11/05/10  sent to Dr Cleone Slim in [facility].

## 2010-11-05 NOTE — Telephone Encounter (Signed)
LMTCB

## 2010-11-05 NOTE — Telephone Encounter (Signed)
Need copy of test result sent to Dr. Allison Quarry.

## 2010-11-06 ENCOUNTER — Encounter: Payer: Self-pay | Admitting: Cardiology

## 2010-11-16 IMAGING — CT CT ABD-PELV W/ CM
1 of 3 series · 13 of 32 positions shown, 18 images · IV contrast (agent unspecified)
Comparison: Prior CT 12/26/2009.

CLINICAL DATA: Upper abdominal pain with nausea, vomiting and
diarrhea.  History of colonic perforation / peritonitis last month.

CT ABDOMEN AND PELVIS WITH CONTRAST
TECHNIQUE: Multidetector CT imaging of the abdomen and pelvis was
performed following the standard protocol during bolus
administration of intravenous contrast.
Contrast: 100 ml Vmnipaque-M88 intravenously.

[Series 2: rtn ap with st · axial · 0.59mm/px · z∈[-373,-8]mm · 13 of 83 slices shown, 18 images]
[im 5/83  soft-tissue]
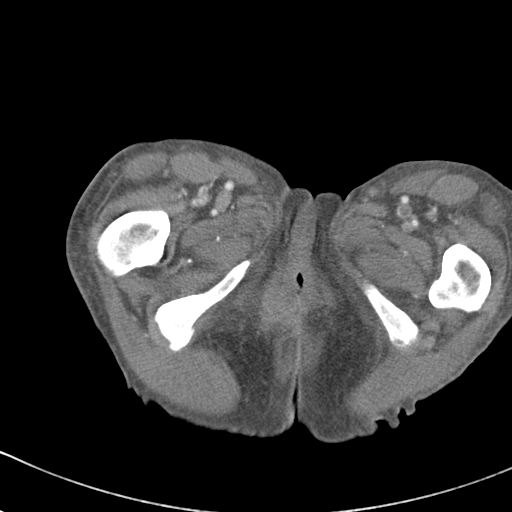
[im 5/83  bone]
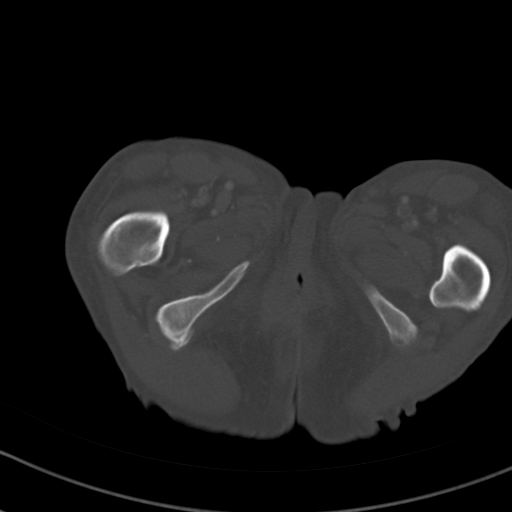
[im 13/83  soft-tissue]
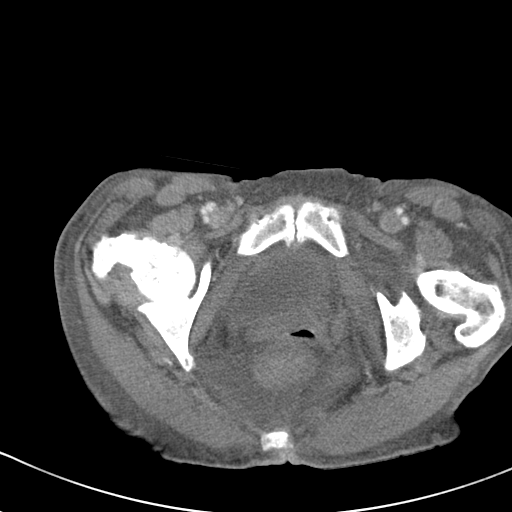
[im 18/83  soft-tissue]
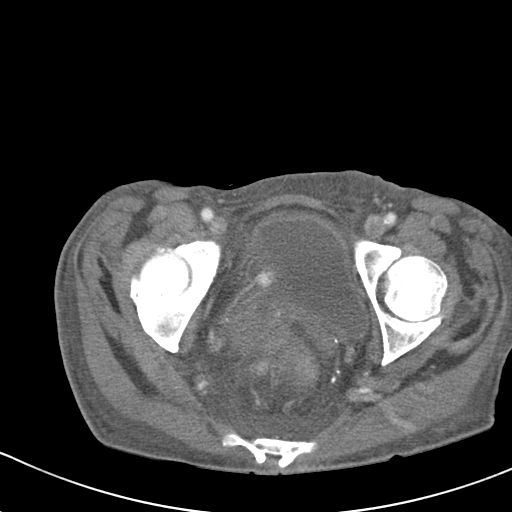
[im 26/83  soft-tissue]
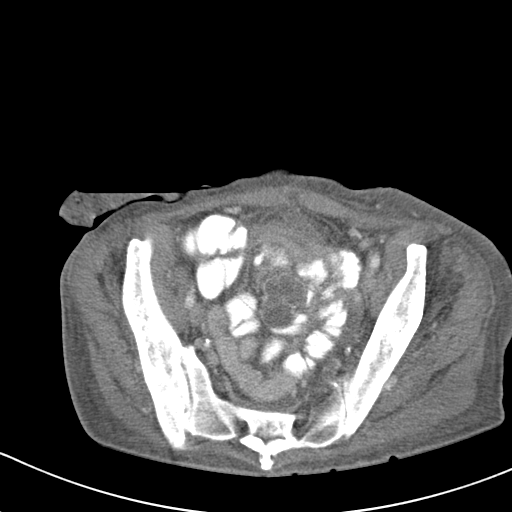
[im 31/83  soft-tissue]
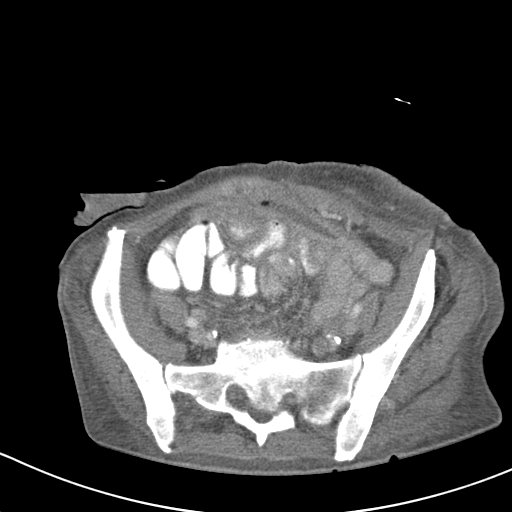
[im 39/83  soft-tissue]
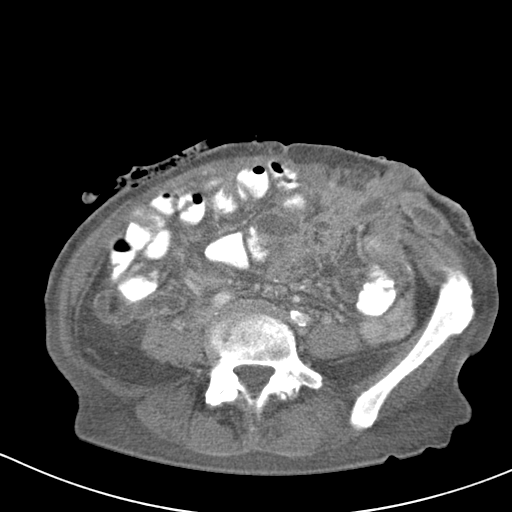
[im 44/83  soft-tissue]
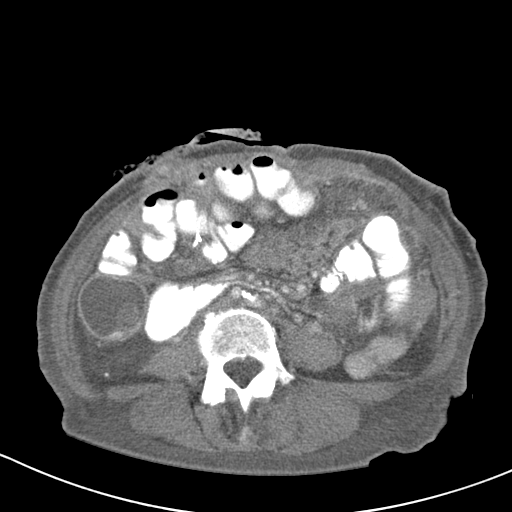
[im 52/83  soft-tissue]
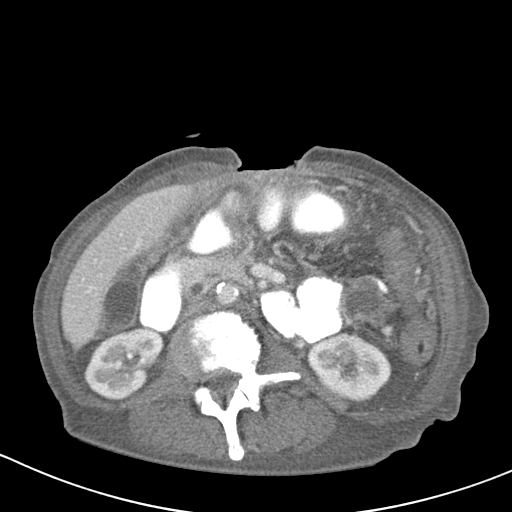
[im 57/83  soft-tissue]
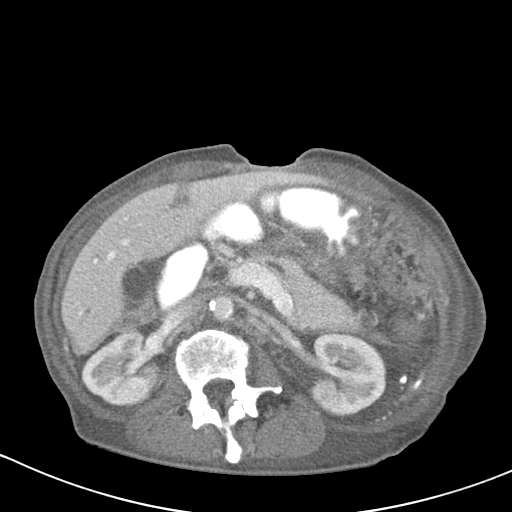
[im 57/83  bone]
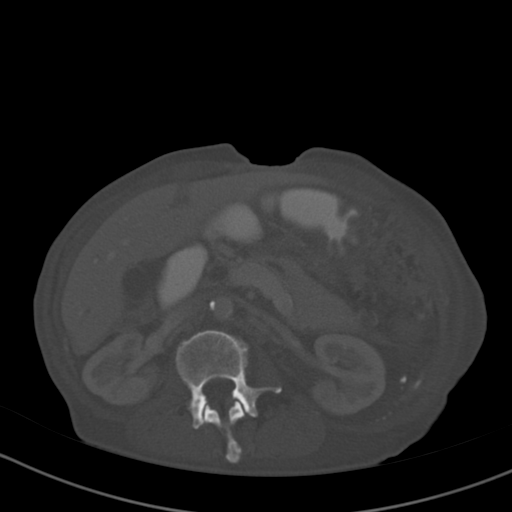
[im 65/83  soft-tissue]
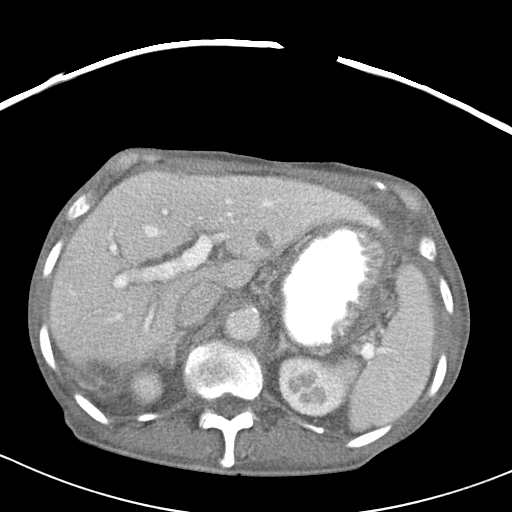
[im 65/83  lung]
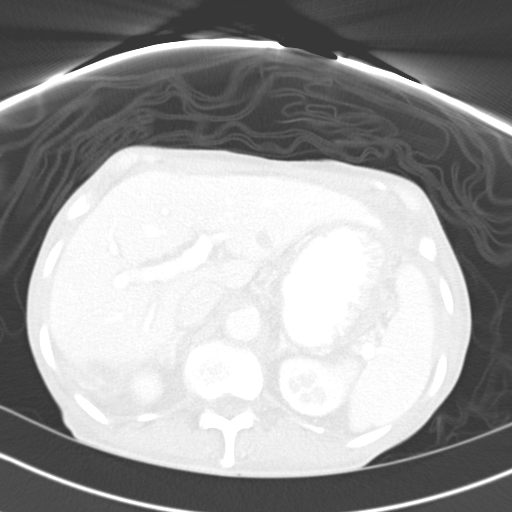
[im 70/83  soft-tissue]
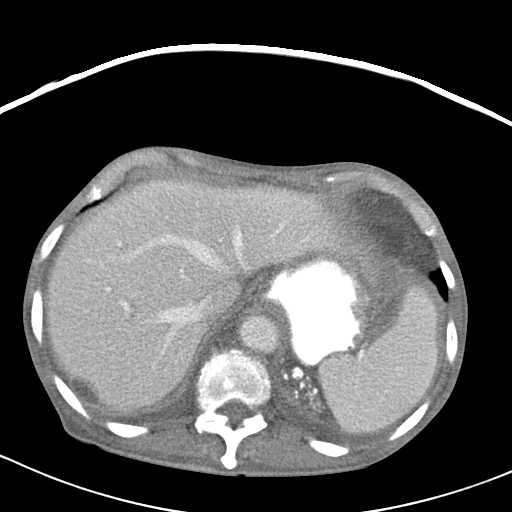
[im 70/83  lung]
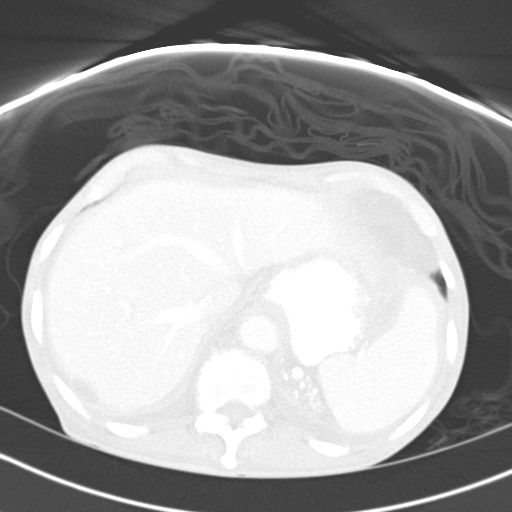
[im 74/83  lung]
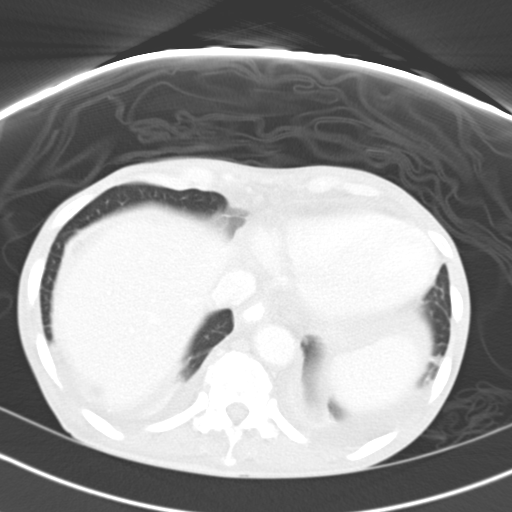
[im 78/83  soft-tissue]
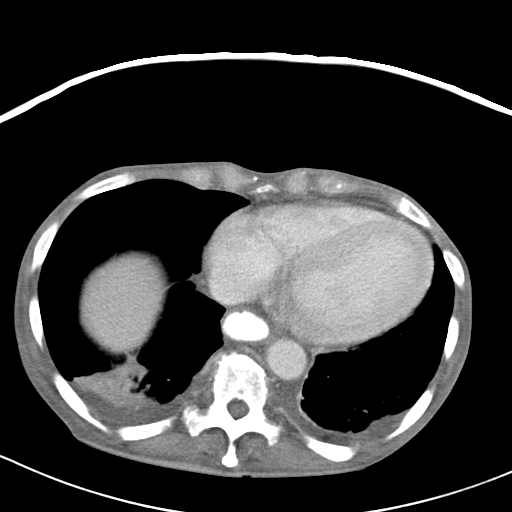
[im 78/83  lung]
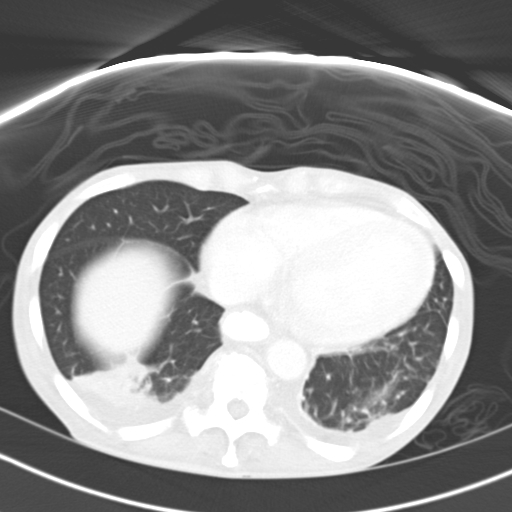

[13 of 32 positions shown; findings below may reference images not displayed]

FINDINGS: The bilateral pleural effusions and associated bibasilar
atelectasis have improved compared with the prior study.
Generalized edema throughout the superficial and deep soft tissues
of the abdomen and pelvis has also slightly improved compared with
prior examination.  However, multiple ring enhancing intra-
abdominal fluid collections are demonstrated.  There are interloop
components within the small bowel mesentery, measuring 3.1 cm on
image 33, 3.5 cm on image 39 and 2.3 cm on image 47.  In the false
pelvis, there is a 3.1 cm fluid collection centrally on image 58.
Multiple other smaller intraperitoneal fluid collections are noted.
There is a lenticular shaped fluid collection beneath the anterior
abdominal wall in the supravesicle region.

There are ring enhancing fluid collections within the subcutaneous
fat surrounding the left-sided ostomy.  These measure 3.0 cm on
image 50 and 3.0 cm on image 45.  No fluid collections are seen
surrounding the right-sided ostomy.  Posterior right peri hepatic
fluid is unchanged.  Multiple areas of increased density throughout
the abdominal fat may be related to prior perforation and
chronically extravasated enteric contrast, unchanged.

11 mm low density lesion in the left hepatic lobe on image 17 is
probably a cyst, although is not well seen previously.  The liver,
spleen, pancreas, adrenal glands and kidneys demonstrate no
significant findings.  The gallbladder is distended.  It contains
sludge and small gallstones.  There is no significant gallbladder
wall thickening.

The colon demonstrates diffuse wall thickening suspicious for
colitis.  No small bowel abnormalities are identified.
IMPRESSION: 1.  Persistent peritonitis with multiple enlarging peripherally
enhancing interloop fluid collections suspicious for abscesses.
2.  Organizing fluid collections surrounding the left ostomy is
suspicious for superficial abscesses.
3.  Diffuse colonic wall thickening and surrounding inflammation
suggestive of superimposed colitis, probably pseudomembranous
colitis (C difficile) in this context.  Correlate clinically.

4.  Similar appearance of the gallbladder with distension and
gallstones/sludge.

Critical test results telephoned to Dr. Taupin at the time of
interpretation on 02/10/2010 at 6964 hours.

## 2010-11-18 ENCOUNTER — Other Ambulatory Visit: Payer: Self-pay | Admitting: Cardiology

## 2010-12-03 ENCOUNTER — Other Ambulatory Visit: Payer: Self-pay | Admitting: Neurosurgery

## 2010-12-03 DIAGNOSIS — M47816 Spondylosis without myelopathy or radiculopathy, lumbar region: Secondary | ICD-10-CM

## 2010-12-07 ENCOUNTER — Ambulatory Visit
Admission: RE | Admit: 2010-12-07 | Discharge: 2010-12-07 | Disposition: A | Discharge status: Home / Self Care | Payer: Medicare Other | Source: Ambulatory Visit | Attending: Neurosurgery | Admitting: Neurosurgery

## 2010-12-07 DIAGNOSIS — M47816 Spondylosis without myelopathy or radiculopathy, lumbar region: Secondary | ICD-10-CM

## 2010-12-28 ENCOUNTER — Telehealth: Payer: Self-pay | Admitting: Cardiology

## 2010-12-28 NOTE — Telephone Encounter (Signed)
Pt had visitor over the weekend, can't find her blood pressure meds. patient is asking what should she do. Metoprolol 25 mg.

## 2010-12-28 NOTE — Telephone Encounter (Signed)
I talked with [name] at [facility]. Toprol XL 25mg  daily  prescription has been refilled. Pt is aware

## 2010-12-31 ENCOUNTER — Ambulatory Visit (INDEPENDENT_AMBULATORY_CARE_PROVIDER_SITE_OTHER): Payer: Medicare Other | Admitting: *Deleted

## 2010-12-31 DIAGNOSIS — R0602 Shortness of breath: Secondary | ICD-10-CM

## 2010-12-31 DIAGNOSIS — R55 Syncope and collapse: Secondary | ICD-10-CM

## 2010-12-31 NOTE — Progress Notes (Signed)
Loop recorder checked in device clinic./ JSH, CMA

## 2011-01-07 NOTE — Progress Notes (Signed)
Subjective:     Patient ID: [name], female   DOB: [birth year], 70 y.o.   MRN: [id number]  HPI  This is a pleasant 70 year old woman who was seen in the office today for evaluation of a right carotid stenosis. She underwent a left carotid endarterectomy with Dacron patch angioplasty in October of 2008. She has been lost to followup since that time. Her cardiologist Dr. Shirlee Latch obtained a carotid duplex scan and she was found to have a 60-79% right carotid stenosis. She was referred for vascular consultation.  The patient denies any history of stroke, TIAs, expressive or receptive aphasia, or amaurosis fugax.    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  Past Medical History  Diagnosis Date  . History of CEA (carotid endarterectomy) 03/2007    Most recent carotid dopplers 6/10 with 50-69% RICA stenosis, LICA ok s/p CEA.  Marland Kitchen Hypertension   . Hyperlipidemia   . GERD (gastroesophageal reflux disease)   . 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 anemia of chronic kidney disease though renal function does not seem severely impaired.  She had repeat bone marrow biopsy by Dr. Cleone Slim 9/10.  Marland Kitchen Episode of syncope     dysauthonomia/ orthostatic syncope - Patient has Medtr[employer] loop recorder implanted 8/10.  No events so far - May be due only to orthostasis  . 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    . History of MRI 01/2009    CardiacMRI/EF 66%, mild to moderate concentric LVH, RV size & systolic function normAL w/o evidence for ARVC, trivial pericardial effusion/On delayed enhancement, it was difficult to obtain the TI/There was patchy mid-wall basal delayed enhancement/There was partially circumferential subendocardialdelayed enhancement in the mid to apical LV/This was suggestive of infiltrative cardiomyopathy versus  . History of CT scan of chest 01/2009   to assess for sarcoidosis: no evidence by CT for pulmonary sarcoid  . 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   . Secretory diarrhea      treated with octreotide.   . Esophageal stricture 05/2010    with dilation  . Amyloidosis     Family History  Problem Relation Age of Onset  . Heart failure Mother   . Heart failure Father     History  Substance Use Topics  . Smoking status: Former Smoker -- 1.0 packs/day    Types: Cigarettes    Quit date: 09/07/1978  . Smokeless tobacco: Never Used  . Alcohol Use: No    No Known Allergies  Current Outpatient Prescriptions  Medication Sig Dispense Refill  . amitriptyline (ELAVIL) 25 MG tablet Take 25 mg by mouth at bedtime.        Marland Kitchen aspirin EC 81 MG tablet Take 1 tablet (81 mg total) by mouth daily.      Marland Kitchen atorvastatin (LIPITOR) 40 MG tablet TAKE ONE TABLET BY MOUTH ONE TIME DAILY  30 tablet  2  . Cholecalciferol (VITAMIN D) 1000 UNITS capsule Take 1,000 Units by mouth daily.        . folic acid (FOLVITE) 1 MG tablet Take 1 mg by mouth daily.        . hydrocodone-acetaminophen (LORCET-HD) 5-500 MG per capsule Take 1 capsule by mouth every 4 (four) hours as needed.        Marland Kitchen  lisinopril (PRINIVIL,ZESTRIL) 5 MG tablet 5 mg daily.       Marland Kitchen LYRICA 50 MG capsule As needed      . megestrol (MEGACE) 40 MG/ML suspension 1 teaspoon thirty min before each meal      . metoprolol succinate (TOPROL-XL) 25 MG 24 hr tablet Take 25 mg by mouth daily. 1 tab qd       . octreotide (SANDOSTATIN) 1000 MCG/ML SOLN Inject into the skin every 12 (twelve) hours. Use as directed       . ondansetron (ZOFRAN) 8 MG tablet Take 8 mg by mouth every 8 (eight) hours as needed.        . pantoprazole (PROTONIX) 40 MG tablet Take 40 mg by mouth 2 (two) times daily.        Marland Kitchen zolpidem (AMBIEN) 10 MG tablet Take 10 mg by mouth at bedtime as needed.          Review of Systems    Constitutional: Positive for appetite change and unexpected weight change. Negative for fever and chills.  Respiratory: Positive for shortness of breath. Negative for cough, chest tightness and wheezing.   Cardiovascular: Negative for chest pain, palpitations and leg swelling.  Gastrointestinal: Positive for diarrhea. Negative for nausea, vomiting, constipation and blood in stool.  Genitourinary: Negative for dysuria, frequency and hematuria.  Musculoskeletal: Negative for myalgias and arthralgias.  Skin: Negative for rash and wound.  Neurological: Negative for dizziness, seizures, speech difficulty, weakness, numbness and headaches.  Hematological: Does not bruise/bleed easily.  Psychiatric/Behavioral: Negative for confusion.    Physical examination The patient appears their stated age.   Filed Vitals:   01/13/11 0930  BP: 147/84  Pulse: 90    Body mass index is 19.13 kg/(m^2).       Objective:   Physical Exam  Constitutional: She is oriented to person, place, and time. She appears well-developed and well-nourished.  HENT:  Head: Normocephalic and atraumatic.  Neck: Neck supple. No JVD present. No thyromegaly present.  Cardiovascular: Normal rate, regular rhythm and normal heart sounds.  Exam reveals no friction rub.   No murmur heard. Pulses:      Carotid pulses are on the right side with bruit, and on the left side with bruit.      Radial pulses are 2+ on the right side, and 2+ on the left side.       Femoral pulses are 2+ on the right side, and 2+ on the left side.      Dorsalis pedis pulses are 2+ on the right side, and 2+ on the left side.  Pulmonary/Chest: Breath sounds normal. She has no wheezes. She has no rales.  Abdominal: Soft. Bowel sounds are normal. There is no tenderness.       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  Lymphadenopathy:    She has no cervical adenopathy.  Neurological: She is alert and oriented to  person, place, and time.  Skin: No rash noted.  Psychiatric: She has a normal mood and affect.   The patient appears their stated age.   There were no vitals filed for this visit.  There is no height or weight on file to calculate BMI.       Assessment:     I have reviewed her carotid duplex scan that was done at the low-power office and shows a 60-79% right carotid stenosis. On the left side she has a moderate stenosis in the common carotid artery proximal  to her endarterectomy site. As she is asymptomatic I explained we would not consider carotid endarterectomy was the stenosis progressed to greater than 80%. I have recommended a followup carotid duplex scan in 6 months and I'll see her back at that time. I have reviewed her records from Dr. Kathi Der office.    Plan:     I have ordered a followup carotid duplex scan in 6 months. She knows to call sooner if she has problems. In the meantime she knows to continue taking her aspirin.

## 2011-01-12 ENCOUNTER — Other Ambulatory Visit (INDEPENDENT_AMBULATORY_CARE_PROVIDER_SITE_OTHER): Payer: Self-pay | Admitting: Internal Medicine

## 2011-01-13 ENCOUNTER — Encounter: Payer: Medicare Other | Admitting: *Deleted

## 2011-01-13 ENCOUNTER — Encounter: Payer: Self-pay | Admitting: Vascular Surgery

## 2011-01-13 ENCOUNTER — Ambulatory Visit (INDEPENDENT_AMBULATORY_CARE_PROVIDER_SITE_OTHER): Payer: Medicare Other | Admitting: Vascular Surgery

## 2011-01-13 VITALS — BP 147/84 | HR 90 | Ht 63.0 in | Wt 108.0 lb

## 2011-01-13 DIAGNOSIS — I6529 Occlusion and stenosis of unspecified carotid artery: Secondary | ICD-10-CM

## 2011-01-13 NOTE — Progress Notes (Deleted)
Eval for Carotid stenosis     REF-->  Dr. Rudi Heap

## 2011-02-05 ENCOUNTER — Encounter (INDEPENDENT_AMBULATORY_CARE_PROVIDER_SITE_OTHER): Payer: Self-pay

## 2011-02-08 ENCOUNTER — Ambulatory Visit (INDEPENDENT_AMBULATORY_CARE_PROVIDER_SITE_OTHER): Payer: Medicare Other | Admitting: Internal Medicine

## 2011-02-08 ENCOUNTER — Encounter (INDEPENDENT_AMBULATORY_CARE_PROVIDER_SITE_OTHER): Payer: Self-pay | Admitting: Internal Medicine

## 2011-02-08 VITALS — BP 110/58 | HR 72 | Temp 99.4°F | Ht 63.0 in | Wt 103.3 lb

## 2011-02-08 DIAGNOSIS — R197 Diarrhea, unspecified: Secondary | ICD-10-CM

## 2011-02-08 DIAGNOSIS — K529 Noninfective gastroenteritis and colitis, unspecified: Secondary | ICD-10-CM

## 2011-02-08 NOTE — Patient Instructions (Signed)
Imodium BID x  5 days. PR in 1 week.

## 2011-02-08 NOTE — Progress Notes (Signed)
Subjective:     Patient ID: [name], female   DOB: 1940/12/28, 70 y.o.   MRN: [id number]  HPI  [name] presents today with c/o that she has been in the hospital x 2  In the past two weeks at [facility] in [place].  She says she was in the hospital for kidney failure caused by dehydration.   She has hx of primary amyloidosis and her oncologist is Dr. Cleone Slim.   She says she is dehydrated now.  She is on octeride for secretory diarrhea.  She says it is not controlled at this time. Her appetite is not good. She has however  gained about 8 pounds since her last office visit in February.  She says she is changing her bag about every hour and half.  Last night her stool was thicker and ":caking around the bag".  She says when she was in the hospital, she was running a fever. She apparently was also started on Cipro and Flagyl for a possible pancreatitis with cholelithiasis.  I will try to obtain those records. She tells me she emptied her colostomy bag  12 times yesterday. She usually has to empty her bag 8 times a day. CT scan in January 09, 2011 revealed Mild chronic fullness of the pancreatic duct without other pancreatic abnormality. There is no evidence of pancreatic mass. There is no evidence of abdominal aortic aneurysm, biliary or ascites. Unchanged calcifications in the left retroperitoneum/perinephric space are wide. Shotty retroperitoneal lymph nodes are stable. Minimal nodularity in the left omentum/peritoneum is unchanged as well. A left lower quadrant ostomy is again noted. Cholelithiasis. No evidence of acute abnormality.  01/07/2011 Amylase 1109. Lipase 46.   Review of Systems see hpi     Past Medical History  Diagnosis Date  . History of CEA (carotid endarterectomy) 03/2007    Left CEA by Dr. Cari Caraway  . Hypertension   . Hyperlipidemia   . GERD (gastroesophageal reflux disease)   . 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 anemia of chronic kidney  disease though renal function does not seem severely impaired.  She had repeat bone marrow biopsy by Dr. Cleone Slim 9/10.  Marland Kitchen Episode of syncope     dysauthonomia/ orthostatic syncope - Patient has [employer] loop recorder implanted 8/10.  No events so far - May be due only to orthostasis  . 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    . History of MRI 01/2009    CardiacMRI/EF 66%, mild to moderate concentric LVH, RV size & systolic function normAL w/o evidence for ARVC, trivial pericardial effusion/On delayed enhancement, it was difficult to obtain the TI/There was patchy mid-wall basal delayed enhancement/There was partially circumferential subendocardialdelayed enhancement in the mid to apical LV/This was suggestive of infiltrative cardiomyopathy versus  . History of CT scan of chest 01/2009    to assess for sarcoidosis: no evidence by CT for pulmonary sarcoid  . 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   . Secretory diarrhea      treated with octreotide.   . Esophageal stricture 05/2010    with dilation  . Amyloidosis    Past Surgical History  Procedure Date  . Implantable loop recorder 2010    MDT for unexplained syncope  . Partial colectomy 1/11    for diverticular perforation  .  Carotid endarterectomy 10/08    left  . Abdominal hysterectomy   . Upper gastrointestinal endoscopy 05/20/2010    EGD ED  . Colonoscopy 03/14/2009   History   Social History  . Marital Status: Divorced    Spouse Name: N/A    Number of Children: N/A  . Years of Education: N/A   Occupational History  . Not on file.   Social History Main Topics  . Smoking status: Former Smoker -- 1.0 packs/day    Types: Cigarettes    Quit date: 09/07/1978  . Smokeless tobacco: Never Used  . Alcohol Use: No  . Drug Use: No  . Sexually Active: Not on file   Other Topics Concern    . Not on file   Social History Narrative   Quit tobacco in 1980.  No ETOH or drugs.  Single, lives in [place], 1 child (daughter in [place]).  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    Family History  Problem Relation Age of Onset  . Heart failure Mother   . Heart failure Father      Objective:   Physical Exam Filed Vitals:   02/08/11 1617  BP: 110/58  Pulse: 72  Temp: 99.4 F (37.4 C)    Alert and oriented. Skin warm and dry. Oral mucosa is moist. Upper and lower dentures. Sclera anicteric, conjunctivae is pink. Thyroid not enlarged. No cervical lymphadenopathy. Lungs clear. Heart regular rate and rhythm.  Abdomen is soft. Bowel sounds are positive. No hepatomegaly. No abdominal masses felt. No tenderness.  No edema to lower extremities. Patient is alert and oriented.      Assessment:    Amyloidosis.diarrhea. I spoke with Dr. Karilyn Cota about this case.     Plan:    Will obtain records from [facility] from her last 2 admissions.  She may take Immodium 2mg  BID x 5 days. She will call with a progress report in 1 week.  I discussed this case with Dr. Karilyn Cota.

## 2011-02-13 ENCOUNTER — Emergency Department (HOSPITAL_COMMUNITY): Payer: Medicare Other

## 2011-02-13 ENCOUNTER — Inpatient Hospital Stay (HOSPITAL_COMMUNITY)
Admission: EM | Admit: 2011-02-13 | Discharge: 2011-02-20 | DRG: 392 | Disposition: A | Discharge status: Home Health | Payer: Medicare Other | Attending: Internal Medicine | Admitting: Internal Medicine

## 2011-02-13 DIAGNOSIS — I10 Essential (primary) hypertension: Secondary | ICD-10-CM | POA: Diagnosis present

## 2011-02-13 DIAGNOSIS — I472 Ventricular tachycardia, unspecified: Secondary | ICD-10-CM | POA: Diagnosis not present

## 2011-02-13 DIAGNOSIS — Z932 Ileostomy status: Secondary | ICD-10-CM

## 2011-02-13 DIAGNOSIS — E859 Amyloidosis, unspecified: Secondary | ICD-10-CM | POA: Diagnosis present

## 2011-02-13 DIAGNOSIS — N179 Acute kidney failure, unspecified: Secondary | ICD-10-CM | POA: Diagnosis present

## 2011-02-13 DIAGNOSIS — Z7982 Long term (current) use of aspirin: Secondary | ICD-10-CM

## 2011-02-13 DIAGNOSIS — Z79899 Other long term (current) drug therapy: Secondary | ICD-10-CM

## 2011-02-13 DIAGNOSIS — I4729 Other ventricular tachycardia: Secondary | ICD-10-CM | POA: Diagnosis not present

## 2011-02-13 DIAGNOSIS — E785 Hyperlipidemia, unspecified: Secondary | ICD-10-CM | POA: Diagnosis present

## 2011-02-13 DIAGNOSIS — R197 Diarrhea, unspecified: Principal | ICD-10-CM | POA: Diagnosis present

## 2011-02-13 DIAGNOSIS — Z933 Colostomy status: Secondary | ICD-10-CM

## 2011-02-13 DIAGNOSIS — E872 Acidosis, unspecified: Secondary | ICD-10-CM | POA: Diagnosis present

## 2011-02-13 DIAGNOSIS — I2510 Atherosclerotic heart disease of native coronary artery without angina pectoris: Secondary | ICD-10-CM | POA: Diagnosis present

## 2011-02-13 DIAGNOSIS — E860 Dehydration: Secondary | ICD-10-CM | POA: Diagnosis present

## 2011-02-13 DIAGNOSIS — E875 Hyperkalemia: Secondary | ICD-10-CM | POA: Diagnosis present

## 2011-02-13 LAB — DIFFERENTIAL
Basophils Absolute: 0 K/uL (ref 0.0–0.1)
Basophils Relative: 0 % (ref 0–1)
Eosinophils Absolute: 0.1 10*3/uL (ref 0.0–0.7)
Eosinophils Relative: 1 % (ref 0–5)
Lymphocytes Relative: 24 % (ref 12–46)
Lymphs Abs: 2.1 10*3/uL (ref 0.7–4.0)
Monocytes Absolute: 0.5 10*3/uL (ref 0.1–1.0)
Monocytes Relative: 6 % (ref 3–12)
Neutro Abs: 5.8 K/uL (ref 1.7–7.7)
Neutrophils Relative %: 69 % (ref 43–77)

## 2011-02-13 LAB — OCCULT BLOOD, POC DEVICE: Fecal Occult Bld: NEGATIVE

## 2011-02-13 LAB — CBC
HCT: 37.1 % (ref 36.0–46.0)
Hemoglobin: 12.7 g/dL (ref 12.0–15.0)
MCH: 30.6 pg (ref 26.0–34.0)
MCHC: 34.2 g/dL (ref 30.0–36.0)
MCV: 89.4 fL (ref 78.0–100.0)
Platelets: 274 K/uL (ref 150–400)
RBC: 4.15 MIL/uL (ref 3.87–5.11)
RDW: 12.8 % (ref 11.5–15.5)
WBC: 8.4 K/uL (ref 4.0–10.5)

## 2011-02-13 LAB — BLOOD GAS, VENOUS
Acid-base deficit: 12.4 mmol/L — ABNORMAL HIGH (ref 0.0–2.0)
Bicarbonate: 14.1 meq/L — ABNORMAL LOW (ref 20.0–24.0)
FIO2: 0.21 %
O2 Saturation: 33.1 %
Patient temperature: 98.6
TCO2: 13.4 mmol/L (ref 0–100)
pCO2, Ven: 35.1 mmHg — ABNORMAL LOW (ref 45.0–50.0)
pH, Ven: 7.228 — ABNORMAL LOW (ref 7.250–7.300)
pO2, Ven: 24.7 mmHg — CL (ref 30.0–45.0)

## 2011-02-13 LAB — LIPASE, BLOOD: Lipase: 122 U/L — ABNORMAL HIGH (ref 11–59)

## 2011-02-13 LAB — LACTIC ACID, PLASMA: Lactic Acid, Venous: 1 mmol/L (ref 0.5–2.2)

## 2011-02-14 LAB — CBC
HCT: 34.5 % — ABNORMAL LOW (ref 36.0–46.0)
Hemoglobin: 11.5 g/dL — ABNORMAL LOW (ref 12.0–15.0)
MCH: 30.2 pg (ref 26.0–34.0)
MCHC: 33.3 g/dL (ref 30.0–36.0)
MCV: 90.6 fL (ref 78.0–100.0)
Platelets: 253 K/uL (ref 150–400)
RBC: 3.81 MIL/uL — ABNORMAL LOW (ref 3.87–5.11)
RDW: 12.9 % (ref 11.5–15.5)
WBC: 6.1 K/uL (ref 4.0–10.5)

## 2011-02-14 LAB — COMPREHENSIVE METABOLIC PANEL WITH GFR
Alkaline Phosphatase: 161 U/L — ABNORMAL HIGH (ref 39–117)
BUN: 55 mg/dL — ABNORMAL HIGH (ref 6–23)
CO2: 13 meq/L — ABNORMAL LOW (ref 19–32)
Chloride: 103 meq/L (ref 96–112)
Creatinine, Ser: 2.26 mg/dL — ABNORMAL HIGH (ref 0.50–1.10)
GFR calc Af Amer: 26 mL/min — ABNORMAL LOW (ref >60)
GFR calc non Af Amer: 21 mL/min — ABNORMAL LOW (ref >60)
Glucose, Bld: 107 mg/dL — ABNORMAL HIGH (ref 70–99)
Potassium: 6.5 meq/L (ref 3.5–5.1)
Total Bilirubin: 0.4 mg/dL (ref 0.3–1.2)

## 2011-02-14 LAB — URINALYSIS, ROUTINE W REFLEX MICROSCOPIC
Bilirubin Urine: NEGATIVE
Glucose, UA: NEGATIVE mg/dL
Hgb urine dipstick: NEGATIVE
Ketones, ur: NEGATIVE mg/dL
Nitrite: NEGATIVE
Protein, ur: 30 mg/dL — AB
Specific Gravity, Urine: 1.022 (ref 1.005–1.030)
Urobilinogen, UA: 0.2 mg/dL (ref 0.0–1.0)
pH: 5 (ref 5.0–8.0)

## 2011-02-14 LAB — BASIC METABOLIC PANEL
BUN: 53 mg/dL — ABNORMAL HIGH (ref 6–23)
Calcium: 10 mg/dL (ref 8.4–10.5)
GFR calc Af Amer: 30 mL/min — ABNORMAL LOW (ref >60)
GFR calc non Af Amer: 25 mL/min — ABNORMAL LOW (ref >60)
Potassium: 4.8 mEq/L (ref 3.5–5.1)
Sodium: 137 mEq/L (ref 135–145)

## 2011-02-14 LAB — COMPREHENSIVE METABOLIC PANEL
ALT: 23 U/L (ref 0–35)
AST: 25 U/L (ref 0–37)
Albumin: 4.6 g/dL (ref 3.5–5.2)
Calcium: 10.2 mg/dL (ref 8.4–10.5)
Sodium: 130 mEq/L — ABNORMAL LOW (ref 135–145)
Total Protein: 8.8 g/dL — ABNORMAL HIGH (ref 6.0–8.3)

## 2011-02-14 LAB — URINE MICROSCOPIC-ADD ON

## 2011-02-14 LAB — BASIC METABOLIC PANEL WITH GFR
CO2: 13 meq/L — ABNORMAL LOW (ref 19–32)
Chloride: 110 meq/L (ref 96–112)
Creatinine, Ser: 2 mg/dL — ABNORMAL HIGH (ref 0.50–1.10)
Glucose, Bld: 134 mg/dL — ABNORMAL HIGH (ref 70–99)

## 2011-02-14 LAB — CLOSTRIDIUM DIFFICILE BY PCR: Toxigenic C. Difficile by PCR: NEGATIVE

## 2011-02-14 LAB — MRSA PCR SCREENING: MRSA by PCR: NEGATIVE

## 2011-02-14 NOTE — H&P (Signed)
[name], [name]               ACCOUNT NO.:  [id number]  MEDICAL RECORD NO.:  [id number]  LOCATION:  WLED                         FACILITY:  [facility]  PHYSICIAN:  Gery Pray, MD      DATE OF BIRTH:  [birth year]  DATE OF ADMISSION:  02/13/2011 DATE OF DISCHARGE:                             HISTORY & PHYSICAL   PRIMARY CARE PHYSICIAN:  Dr. Christell Constant at [facility] in [place].  GI DOCTOR:  [facility].  She sees Dr. Jenne Campus.  CODE STATUS:  Full code.  Patient goes to team 2.  CHIEF COMPLAINT:  Nausea, vomiting, diarrhea.  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  She had amyloidosis in the past.  She had chemotherapy.  Her amyloidosis responded appropriately to chemotherapy.  However, she went onto develop secretory diarrhea thought to be from the chemotherapy.  She has a colostomy and ileostomy.  She states within the past 2 weeks, she has been hospitalized twice at [facility] with dehydration and renal failure.  She states that since her discharge despite trying to drink lots of fluids, she has had nausea, vomiting, and ongoing diarrhea.  She states that her colostomy which is on the right has had increased fluid output as well as gas output and she has had to change colostomy almost every 30 minutes.  She states that it is more gas than fluid.  Her right iliostomy has been putting out mostly mucus; however, she has gotten weaker and presyncopal.  She fell yesterday.  She had no loss of consciousness.  She did not hit her head.  Patient lives alone.  She reports no fevers, constant chills.  She states she has not been urinating for the past 24 hours.  She reports some abdominal cramping, but no significant pain and it is intermittent.  She reports no confusion.  No blood in her emesis nor in her colostomy output.  Patient usually goes to [facility], [address]; however, she came to [facility] today.  History obtained from the patient who is weak, but alert, oriented, and appears reliable.  PAST MEDICAL HISTORY: 1. Coronary artery disease. 2. Hypertension. 3. Syncope. 4. Amyloidosis. 5. Colitis. 6. History of C. diff infection.  PAST SURGICAL HISTORY:  Hysterectomy, ileostomy on the left in January 2011, colostomy on the right in June 2011.  She also has implantable loop recorder.  MEDICATIONS:  Hydrocodone, amitriptyline, aspirin, simethicone, cimetidine, diphenhydramine, metoprolol, multivitamin, Lyrica, Zofran, Protonix, folic acid, Crestor, atorvastatin, lisinopril, and Ambien.  ALLERGIES:  No known drug allergies.  SOCIAL HISTORY:  Negative tobacco, alcohol, or illicit drugs.  Patient lives alone at home.  She uses a walker and a cane.  She has a shower bench and a portable toilet, [facility] home [facility].  She has a visiting nurse twice a week.  She has support person that comes and sees her daily.  She says she has supportive family.  FAMILY HISTORY:  Significant for diabetes mellitus, coronary artery disease.  PHYSICAL EXAMINATION:  VITAL SIGNS:  Blood pressure 149/93, pulse 103, currently 98, respirations 28, currently 20, temperature 98.8, saturating 100% on room air. GENERAL:  Alert, oriented, weak female, cachectic. EYES:  Pink conjunctivae.  PERRLA. ENT:  Moist oral mucosa.  Trachea midline. NECK:  Supple.  Positive dental caries.  No thyromegaly. LUNGS:  Clear to auscultation bilaterally.  No use of accessory muscles. No wheezing. CARDIOVASCULAR:  Regular rate and rhythm without murmurs, rigors, or gallops.  No JVD. ABDOMEN:  Soft.  No tenderness elicited.  Positive bowel sounds. Patient has colostomy and ileostomy. NEURO:  Cranial nerves II through XII grossly intact.  Sensation intact. MUSCULOSKELETAL:  Strength is 5/5 in all extremities.  No clubbing, cyanosis, or edema. SKIN:  No rashes.  No subcutaneous crepitations.  No decubiti. PSYCH:   Alert, oriented, appropriate.  LABS:  Sodium 130, potassium 6.5, chloride 103, CO2 13, glucose 105, BUN 55, creatinine 2.26.  Baseline is normal.  LFTs are normal with exception of alk phos which was 161.  UA has nitrite negative, small leukocyte esterase, many bacteria.  Occult blood is negative.  Abdominal series shows no acute process.  Lipase 122.  White blood count 8.4, hemoglobin 12.5, platelets 274.  ASSESSMENT AND PLAN: 1. Dehydration. 2. Nausea, vomiting, diarrhea. 3. Acute kidney injury. 4. Hyperkalemia. 5. 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  Patient will be admitted.     We will start her on IV fluid hydration.  Kayexalate has been     ordered for her stools now, to repeat a BMP 90 minutes after it has     been given.  We will reorder C. diff toxins, ova and parasites      these were most recently done at [facility] and negative,     but she does have a history of C. diff.  Consult [facility] if needed in AM. 6. Amyloidosis, stable. 7. Hypertension. 8. Coronary artery disease, all stable.  We will resume home     medications with exception of lisinopril with patient's acute     kidney injury and hyperkalemia.  We will write hold parameters and     blood pressure medications.          ______________________________ Gery Pray, MD     DC/MEDQ  D:  02/14/2011  T:  02/14/2011  Job:  [id number]  Electronically Signed by Gery Pray MD on 02/14/2011 04:03:52 AM

## 2011-02-15 LAB — BASIC METABOLIC PANEL
Calcium: 8.8 mg/dL (ref 8.4–10.5)
Creatinine, Ser: 1.22 mg/dL — ABNORMAL HIGH (ref 0.50–1.10)
GFR calc non Af Amer: 44 mL/min — ABNORMAL LOW (ref >60)
Glucose, Bld: 106 mg/dL — ABNORMAL HIGH (ref 70–99)
Sodium: 136 mEq/L (ref 135–145)

## 2011-02-16 DIAGNOSIS — E859 Amyloidosis, unspecified: Secondary | ICD-10-CM

## 2011-02-16 DIAGNOSIS — E869 Volume depletion, unspecified: Secondary | ICD-10-CM

## 2011-02-16 DIAGNOSIS — R197 Diarrhea, unspecified: Secondary | ICD-10-CM

## 2011-02-16 LAB — BASIC METABOLIC PANEL
BUN: 16 mg/dL (ref 6–23)
Calcium: 8.3 mg/dL — ABNORMAL LOW (ref 8.4–10.5)
Chloride: 111 mEq/L (ref 96–112)
Creatinine, Ser: 0.92 mg/dL (ref 0.50–1.10)
GFR calc Af Amer: >60 mL/min (ref >60)
GFR calc non Af Amer: >60 mL/min (ref >60)

## 2011-02-16 LAB — CBC
HCT: 26.7 % — ABNORMAL LOW (ref 36.0–46.0)
MCH: 31 pg (ref 26.0–34.0)
MCHC: 34.5 g/dL (ref 30.0–36.0)
MCV: 89.9 fL (ref 78.0–100.0)
Platelets: 189 10*3/uL (ref 150–400)
RDW: 12.9 % (ref 11.5–15.5)
WBC: 4.8 10*3/uL (ref 4.0–10.5)

## 2011-02-16 LAB — MAGNESIUM: Magnesium: 1.4 mg/dL — ABNORMAL LOW (ref 1.5–2.5)

## 2011-02-17 DIAGNOSIS — E869 Volume depletion, unspecified: Secondary | ICD-10-CM

## 2011-02-17 DIAGNOSIS — E859 Amyloidosis, unspecified: Secondary | ICD-10-CM

## 2011-02-17 DIAGNOSIS — R197 Diarrhea, unspecified: Secondary | ICD-10-CM

## 2011-02-17 LAB — OVA AND PARASITE EXAMINATION: Ova and parasites: NONE SEEN

## 2011-02-17 LAB — CBC
HCT: 27 % — ABNORMAL LOW (ref 36.0–46.0)
Hemoglobin: 9.2 g/dL — ABNORMAL LOW (ref 12.0–15.0)
MCHC: 34.1 g/dL (ref 30.0–36.0)
MCV: 89.4 fL (ref 78.0–100.0)
RDW: 12.8 % (ref 11.5–15.5)

## 2011-02-17 LAB — BASIC METABOLIC PANEL
BUN: 9 mg/dL (ref 6–23)
CO2: 22 mEq/L (ref 19–32)
Calcium: 8.8 mg/dL (ref 8.4–10.5)
Chloride: 110 mEq/L (ref 96–112)
Creatinine, Ser: 0.88 mg/dL (ref 0.50–1.10)
GFR calc Af Amer: >60 mL/min (ref >60)
GFR calc non Af Amer: >60 mL/min (ref >60)
Glucose, Bld: 104 mg/dL — ABNORMAL HIGH (ref 70–99)
Potassium: 3.6 mEq/L (ref 3.5–5.1)
Sodium: 140 mEq/L (ref 135–145)

## 2011-02-17 LAB — IRON AND TIBC
Iron: 35 ug/dL — ABNORMAL LOW (ref 42–135)
Saturation Ratios: 18 % — ABNORMAL LOW (ref 20–55)
TIBC: 192 ug/dL — ABNORMAL LOW (ref 250–470)
UIBC: 157 ug/dL (ref 125–400)

## 2011-02-17 LAB — VITAMIN B12: Vitamin B-12: 538 pg/mL (ref 211–911)

## 2011-02-17 LAB — MAGNESIUM: Magnesium: 1.3 mg/dL — ABNORMAL LOW (ref 1.5–2.5)

## 2011-02-17 LAB — FERRITIN: Ferritin: 521 ng/mL — ABNORMAL HIGH (ref 10–291)

## 2011-02-17 LAB — FOLATE: Folate: >20 ng/mL

## 2011-02-18 DIAGNOSIS — R197 Diarrhea, unspecified: Secondary | ICD-10-CM

## 2011-02-18 DIAGNOSIS — E859 Amyloidosis, unspecified: Secondary | ICD-10-CM

## 2011-02-18 DIAGNOSIS — E869 Volume depletion, unspecified: Secondary | ICD-10-CM

## 2011-02-18 LAB — CBC
HCT: 26.6 % — ABNORMAL LOW (ref 36.0–46.0)
Hemoglobin: 9.2 g/dL — ABNORMAL LOW (ref 12.0–15.0)
MCH: 30.7 pg (ref 26.0–34.0)
MCHC: 34.6 g/dL (ref 30.0–36.0)
RBC: 3 MIL/uL — ABNORMAL LOW (ref 3.87–5.11)

## 2011-02-18 LAB — KAPPA/LAMBDA LIGHT CHAINS
Kappa free light chain: 0.72 mg/dL (ref 0.33–1.94)
Kappa, lambda light chain ratio: 0.53 (ref 0.26–1.65)
Lambda free light chains: 1.36 mg/dL (ref 0.57–2.63)

## 2011-02-18 LAB — COMPREHENSIVE METABOLIC PANEL
ALT: 13 U/L (ref 0–35)
Alkaline Phosphatase: 104 U/L (ref 39–117)
BUN: 6 mg/dL (ref 6–23)
CO2: 23 mEq/L (ref 19–32)
Calcium: 8.3 mg/dL — ABNORMAL LOW (ref 8.4–10.5)
GFR calc Af Amer: >60 mL/min (ref >60)
GFR calc non Af Amer: >60 mL/min (ref >60)
Glucose, Bld: 102 mg/dL — ABNORMAL HIGH (ref 70–99)
Potassium: 3.7 mEq/L (ref 3.5–5.1)
Sodium: 139 mEq/L (ref 135–145)
Total Protein: 5.9 g/dL — ABNORMAL LOW (ref 6.0–8.3)

## 2011-02-18 LAB — MAGNESIUM: Magnesium: 1.5 mg/dL (ref 1.5–2.5)

## 2011-02-18 LAB — IGG, IGA, IGM: IgA: 196 mg/dL (ref 69–380)

## 2011-02-19 DIAGNOSIS — R197 Diarrhea, unspecified: Secondary | ICD-10-CM

## 2011-02-19 DIAGNOSIS — E869 Volume depletion, unspecified: Secondary | ICD-10-CM

## 2011-02-19 DIAGNOSIS — E859 Amyloidosis, unspecified: Secondary | ICD-10-CM

## 2011-02-19 LAB — PROTEIN, URINE, 24 HOUR
Collection Interval-UPROT: 24 hours
Urine Total Volume-UPROT: 1250 mL

## 2011-02-20 LAB — BASIC METABOLIC PANEL
Calcium: 9.2 mg/dL (ref 8.4–10.5)
GFR calc Af Amer: >60 mL/min (ref >60)
GFR calc non Af Amer: 53 mL/min — ABNORMAL LOW (ref >60)
Glucose, Bld: 103 mg/dL — ABNORMAL HIGH (ref 70–99)
Sodium: 137 mEq/L (ref 135–145)

## 2011-02-22 NOTE — Discharge Summary (Signed)
[name], [name] NO.:  [id number]  MEDICAL RECORD NO.:  [id number]  LOCATION:  1425                         FACILITY:  [facility]  PHYSICIAN:  Brendia Sacks, MD    DATE OF BIRTH:  10/04/1940  DATE OF ADMISSION:  02/13/2011 DATE OF DISCHARGE:  02/20/2011                              DISCHARGE SUMMARY   PRIMARY CARE PHYSICIAN:  Dr. Ernestina Penna, M.D. [facility].  PRIMARY GASTROENTEROLOGIST:  Dr. Lionel December, M.D.  PRIMARY ONCOLOGIST:  Dr. Lynett Fish, M.D.  CONDITION ON DISCHARGE:  Improved.  DISPOSITION:  Home.  She has already been with home health for RN assessments and these will continue at this time.  She was assessed by Physical Therapy and not felt to have any outpatient needs.  DISCHARGE DIAGNOSES: 1. Secretory diarrhea, etiology unclear. 2. Amyloidosis, stable. 3. Acute renal failure, resolved. 4. Metabolic acidosis, resolved.  HISTORY OF PRESENT ILLNESS:  This is a 70 year old woman who presented to the emergency room with nausea, vomiting, and diarrhea.  HOSPITAL COURSE:  [name] was admitted to the medical floor and seen in consultation with both Gastroenterology and Oncology.  She was placed on Xifaxan and was recommended she continue this for 14 days.  She also had her octreotide dose increased to t.i.d.  Subsequently, volume and stools has decreased and consistency has thickened.  She has been drinking and eating well and feels quite ready to go home.  She is maintaining her hydration.  She is seen by GI and at this point, they recommend discharge home and follow up with her primary gastroenterologist in 1-2 weeks.  She will follow up with Gastroenterology in the outpatient setting.  In regard to amyloidosis, this is felt to be stable at this point. Oncology did order a few studies which they will follow up on and they will arrange for outpatient followup with Dr. Cleone Slim in the outpatient setting.  This  was not felt to be a contributing issue to her secretory diarrhea.  Note, her secretory diarrhea has been extensively investigated in the past.  Has acute renal failure secondary to diarrhea, this resolved.  CONSULTATIONS: 1. Gastroenterology.  Their recommendation is above. 2. Oncology.  Their recommendation is above.  PROCEDURES:  None.  IMAGING:  Acute abdominal series, September 1:  No acute cardiopulmonary process.  No evidence of bowel obstruction.  Lower quadrant ostomy is noted.  MICROBIOLOGY:  Ova and parasite screen was negative.  PERTINENT LABORATORY STUDIES: 1. Clostridium difficile was negative.  Fecal fat was normal.  CBC was     notable for hemoglobin of 9.2 which remained stable during this     hospitalization. 2. Basic metabolic panel notable for creatinine of 2.26 on admission     with a bicarb of 13.  On discharge, creatinine 1.08 and bicarb of     28.  EXAM ON DISCHARGE:  GENERAL:  The patient is feeling well.  Diarrhea has decreased.  She is maintaining her hydration. VITAL SIGNS:  Temperature is 97.8, pulse 73, respirations 14, blood pressure 142/81, and sat 99% on room air.  CARDIOVASCULAR:  Regular rate and rhythm. RESPIRATORY:  Clear to auscultation bilaterally.  No  wheezes, rales, or rhonchi.  No lower extremity edema.  DISCHARGE INSTRUCTIONS:  The patient will be discharged home.  DIET:  A heart-healthy diet.  Increase activity slowly.  Followup with Dr. Karilyn Cota in 1-2 weeks, Dr. Christell Constant in 1-2 weeks, and Dr. Isabel Caprice as directed.  She will resume home health RN per [facility] home [facility].  DISCHARGE MEDICATIONS: 1. Octreotide has been increased to 100 mcg subcu t.i.d. 2. Rifaximin 550 mg p.o. t.i.d.  Resume the following home medications; 1. Amitriptyline 25 mg p.o. q.h.s. 2. Aspirin 81 mg p.o. daily. 3. Atorvastatin 40 mg p.o. q.h.s. 4. Diphenhydramine 25 mg p.o. b.i.d. for itching as needed. 5. Folic acid 1 mg p.o. daily. 6. Lortab 5/325 1  tablet p.o. q.i.d. as needed for leg and back pain. 7. Imodium 2 mg 1-2 capsules by mouth after loose stools as needed. 8. Lisinopril 5 mg p.o. daily. 9. Lyrica 50 mg p.o. daily. 10.Metoprolol XL 25 mg p.o. daily. 11.Multivitamin p.o. daily. 12.Zofran 8 mg p.o. q.8 hours as needed for nausea. 13.Protonix 40 mg p.o. b.i.d. 14.Simethicone 125 mg p.o. 6 times a day as needed for excessive gas. 15.Vitamin D3 p.o. daily. 16.Zolpidem 10 mg p.o. q.h.s.  Time coordinating discharge is 25 minutes.     Brendia Sacks, MD     DG/MEDQ  D:  02/20/2011  T:  02/20/2011  Job:  [id number]  cc:   Ernestina Penna, M.D. Fax: [phone]  Lionel December, M.D. Fax: [phone]  Lynett Fish, M.D. Fax: [phone]  Electronically Signed by Brendia Sacks  on 02/22/2011 09:49:10 PM

## 2011-02-23 LAB — UIFE/LIGHT CHAINS/TP QN, 24-HR UR
Albumin, U: DETECTED
Alpha 1, Urine: DETECTED — AB
Free Kappa/Lambda Ratio: 3.6 ratio (ref 2.04–10.37)
Free Lambda Excretion/Day: 39.25 mg/d
Free Lambda Lt Chains,Ur: 3.14 mg/dL — ABNORMAL HIGH (ref 0.02–0.67)
Time: 24 hours
Total Protein, Urine-Ur/day: 555 mg/d — ABNORMAL HIGH (ref 10–140)
Total Protein, Urine: 44.4 mg/dL
Volume, Urine: 1250 mL

## 2011-02-26 NOTE — Consult Note (Signed)
NAMEKRISTYANNA, [name]               ACCOUNT NO.:  [id number]  MEDICAL RECORD NO.:  [id number]  LOCATION:  1425                         FACILITY:  [facility]  PHYSICIAN:  Jordan Hawks. Elnoria Howard, MD    DATE OF BIRTH:  1940/11/18  DATE OF CONSULTATION:  02/14/2011 DATE OF DISCHARGE:                                CONSULTATION   GASTROENTEROLOGISTS:  Lionel December, M.D. and also Venita Lick. Russella Dar, MD, [facility]  HISTORY OF PRESENT ILLNESS:  This is a 70 year old female with a past medical history of primary amyloidosis, coronary artery disease, hypertension, syncope, history of C. diff infection, status post colonic rupture of the sigmoid colon with a colostomy, and subsequent colonic rupture of her cecum with a resultant ileostomy, who was admitted to the hospital with persistent diarrhea.  Patient reports that over the past 2- 1/2 weeks, she has been to the emergency room 3 times for dehydration. As a result of her symptoms, she was subsequently admitted.  Patient was seen by Dr. Karilyn Cota most recently, and per her report, she was started on octreotide approximately 1 year ago for secretory diarrhea.  The octreotide was beneficial for her, however, never completely resolved diarrhea, and it was to a point that it was manageable, but the reasons are not clear.  She started having a profuse amount of diarrhea.  This diarrhea occurs if she does not have any p.o. intake, although the volume is not as great.  At times, she has tried to avoid p.o. intake in order to help ameliorate her symptoms, but it was difficult for her because that she became hungry.  As a result of her persistent symptoms, she wanted to be admitted to [facility] and subsequently presented into the emergency room, is hoping to have Dr. Russella Dar assist in her care.  She was previously evaluated by Dr. Russella Dar in 2007 for an anemia, but subsequently, she has apparently not followed up.  She denies having any abdominal pain, but is  positive for some nausea and vomiting, which is mild.  In the past, she was treated with chemotherapy for her amyloidosis and this diagnosis was made approximately 2 years ago.  Previous to that time, she reports that she was well.  As a result of the primary amyloidosis, she also has cardiomyopathy and also renal insufficiency issues.  Because of her GI complaints, a consultation was requested.  PAST MEDICAL HISTORY AND PAST SURGICAL HISTORY:  As stated above.  FAMILY HISTORY:  Noncontributory.  SOCIAL HISTORY:  Negative for alcohol, tobacco, or illicit drug use.  ALLERGIES:  No known drug allergies.  MEDICATIONS: 1. Amitriptyline 25 mg p.o. q.h.s. 2. Aspirin 81 mg p.o. q. day. 3. Vitamin D 1000 units p.o. q. day. 4. Folic acid 1 mg p.o. q. day. 5. Subcu heparin. 6. Metoprolol 25 mg p.o. q. day. 7. Multivitamins. 8. Octreotide 100 mcg subcu q.8 hours. 9. Protonix 40 mg p.o. b.i.d. 10.Lyrica 50 mg p.o. q. day. 11.Crestor 20 mg p.o. q.h.s. 12.Zofran 4 to 8 mg q.6 hours p.r.n. 13.Loperamide 2 to 4 mg p.o. p.r.n.  LABORATORY VALUES:  His white blood cell count is 6.1, hemoglobin 11.5, MCV is 90.6, platelets are 253.  Sodium is 137, potassium 4.8, chloride 110, CO2 of 13, creatinine of 134, BUN of 53, creatinine 2.0.  IMPRESSION: 1. Diarrhea, presumed to be secretory. 2. Primary amyloidosis. 3. Status post ileostomy and colostomy.  Patient has a complex medical     history and I am uncertain about the exact etiology of her     diarrhea.  She has a history of Clostridium difficile and certainly     that would be worthwhile to recheck for this.  There are some rare     cases of having small bowel Clostridium difficile.  This could also     be a manifestation of her amyloidosis, although Dr. Karilyn Cota did     perform an EGD before and biopsies of the esophagus ring were     negative for any amyloid deposition.  No reports of any procedures     through her ileostomy to obtain the  small bowel biopsy.  The     diarrhea could be secondary to malabsorption issues, and therefore,     it will be important to check a fecal fat, also a pancreatic     elastase would be beneficial, although she has no history of any     overt alcohol abuse.  Amyloidosis can also result in bacterial     overgrowth, which can result in diarrhea; and therefore, an empiric     trial of Xifaxan can be performed as we do not have the hydrogen     breath tests available here at the hospital.  PLAN: 1. Check for fecal fat. 2. Check pancreatic elastase. 3. Check for C. diff PCR. 4. Possible trial with Xifaxan if the above tests are negative.     Jordan Hawks Elnoria Howard, MD     PDH/MEDQ  D:  02/14/2011  T:  02/15/2011  Job:  [id number]  Electronically Signed by Jeani Hawking MD on 02/26/2011 08:49:47 AM

## 2011-03-04 ENCOUNTER — Telehealth (INDEPENDENT_AMBULATORY_CARE_PROVIDER_SITE_OTHER): Payer: Self-pay | Admitting: *Deleted

## 2011-03-04 NOTE — Telephone Encounter (Signed)
Please call Dr. Cleone Slim in reference to Mackenzye's labwork.  I think he is afraid she is having a pancreatis flare.  Call [phone] or his cell [phone].

## 2011-03-04 NOTE — Telephone Encounter (Signed)
I have spoke with Dr. Cleone Slim. He wants an ERCP with possible spincterotomy

## 2011-03-08 ENCOUNTER — Ambulatory Visit (INDEPENDENT_AMBULATORY_CARE_PROVIDER_SITE_OTHER): Payer: Medicare Other | Admitting: Internal Medicine

## 2011-03-08 ENCOUNTER — Encounter (INDEPENDENT_AMBULATORY_CARE_PROVIDER_SITE_OTHER): Payer: Self-pay | Admitting: Internal Medicine

## 2011-03-08 VITALS — BP 112/76 | HR 72 | Temp 98.5°F | Ht 63.0 in | Wt 104.6 lb

## 2011-03-08 DIAGNOSIS — K8689 Other specified diseases of pancreas: Secondary | ICD-10-CM

## 2011-03-08 DIAGNOSIS — E859 Amyloidosis, unspecified: Secondary | ICD-10-CM

## 2011-03-08 NOTE — Progress Notes (Signed)
Subjective:     Patient ID: [name], female   DOB: 09/20/1940, 70 y.o.   MRN: [id number]  HPI Patient ID: [name], female DOB: 05-Jan-1941, 70 y.o. MRN: [id number]     She has hx of primary amyloidosis and her oncologist is Dr. Cleone Slim.   She is on octeride for secretory diarrhea. She says it is not controlled at this time. Her appetite is not good.  She is maintaining her weight.    I will try to obtain those records. She tells me she emptied her colostomy bag about 24 times a day and ileostomy bag once a day. Stools go from mushy to liquid. CT scan in January 09, 2011 revealed Mild chronic fullness of the pancreatic duct without other pancreatic abnormality. There is no evidence of pancreatic mass. There is no evidence of abdominal aortic aneurysm, biliary or ascites. Unchanged calcifications in the left retroperitoneum/perinephric space are wide. Shotty retroperitoneal lymph nodes are stable. Minimal nodularity in the left omentum/peritoneum is unchanged as well. A left lower quadrant ostomy is again noted. Cholelithiasis. No evidence of acute abnormality.     She was recently admitted to [facility] for dehydration. She was admitted to [facility]. Her osteride was increased.  I spoke with Dr. Cleone Slim.  Her amylase on 02/22/2011 was 155, and  03/04/2011: Amylase 155, ALP 110, Total bili 0.6, ALT 21.    Review of Systems see hpi Current Outpatient Prescriptions  Medication Sig Dispense Refill  . amitriptyline (ELAVIL) 25 MG tablet Take 25 mg by mouth at bedtime.        Marland Kitchen aspirin EC 81 MG tablet Take 1 tablet (81 mg total) by mouth daily.      Marland Kitchen atorvastatin (LIPITOR) 40 MG tablet TAKE ONE TABLET BY MOUTH ONE TIME DAILY  30 tablet  2  . Cholecalciferol (VITAMIN D) 1000 UNITS capsule Take 1,000 Units by mouth daily.       . ergocalciferol (VITAMIN D2) 50000 UNITS capsule Take 50,000 Units by mouth once a week.        . folic acid (FOLVITE) 1 MG tablet Take 1 mg by mouth daily.        .  hydrocodone-acetaminophen (LORCET-HD) 5-500 MG per capsule Take 1 capsule by mouth every 4 (four) hours as needed.        Marland Kitchen lisinopril (PRINIVIL,ZESTRIL) 5 MG tablet 5 mg daily.       Marland Kitchen LYRICA 50 MG capsule Take 50 mg by mouth daily. As needed      . megestrol (MEGACE) 40 MG/ML suspension Take 200 mg by mouth 2 (two) times daily before a meal. 1 teaspoon thirty min before each meal      . metoprolol succinate (TOPROL-XL) 25 MG 24 hr tablet Take 25 mg by mouth daily. 1 tab qd       . Multiple Vitamin (MULTIVITAMIN) tablet Take 1 tablet by mouth daily.        Marland Kitchen octreotide (SANDOSTATIN) 1000 MCG/ML SOLN        . ondansetron (ZOFRAN) 8 MG tablet Take 8 mg by mouth every 8 (eight) hours as needed.       . rifaximin (XIFAXAN) 550 MG TABS Take 550 mg by mouth 3 (three) times daily.       Marland Kitchen zolpidem (AMBIEN) 10 MG tablet Take 10 mg by mouth at bedtime as needed.        . pantoprazole (PROTONIX) 40 MG tablet Take 40 mg by mouth 2 (two) times daily.  Past Surgical History  Procedure Date  . Implantable loop recorder 2010    MDT for unexplained syncope  . Partial colectomy 1/11    for diverticular perforation  . Carotid endarterectomy 10/08    left  . Abdominal hysterectomy   . Upper gastrointestinal endoscopy 05/20/2010    EGD ED  . Colonoscopy 03/14/2009   . Past Medical History  Diagnosis Date  . History of CEA (carotid endarterectomy) 03/2007    Left CEA by Dr. Cari Caraway  . Hypertension   . Hyperlipidemia   . GERD (gastroesophageal reflux disease)   . 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 anemia of chronic kidney disease though renal function does not seem severely impaired.  She had repeat bone marrow biopsy by Dr. Cleone Slim 9/10.  Marland Kitchen Episode of syncope     dysauthonomia/ orthostatic syncope - Patient has [employer] loop recorder implanted 8/10.  No events so far - May be due only to orthostasis  . 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    . History of MRI 01/2009    CardiacMRI/EF 66%, mild to moderate concentric LVH, RV size & systolic function normAL w/o evidence for ARVC, trivial pericardial effusion/On delayed enhancement, it was difficult to obtain the TI/There was patchy mid-wall basal delayed enhancement/There was partially circumferential subendocardialdelayed enhancement in the mid to apical LV/This was suggestive of infiltrative cardiomyopathy versus  . History of CT scan of chest 01/2009    to assess for sarcoidosis: no evidence by CT for pulmonary sarcoid  . 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   . Secretory diarrhea      treated with octreotide.   . Esophageal stricture 05/2010    with dilation  . Amyloidosis     Objective:   Physical Exam Filed Vitals:   03/08/11 1653  BP: 112/76  Pulse: 72  Temp: 98.5 F (36.9 C)  Height: 5\' 3"  (1.6 m)  Weight: 104 lb 9.6 oz (47.446 kg)    .Alert and oriented. Skin warm and dry. Oral mucosa is moist.  Sclera anicteric, conjunctivae is pink. Thyroid not enlarged. No cervical lymphadenopathy. Lungs clear. Heart regular rate and rhythm.  Abdomen is soft. Bowel sounds are positive. No hepatomegaly. No abdominal masses felt. No tenderness. Ileostomy bag and a colostomy bag both are empty.  No edema to lower extremities. Patient is alert and oriented.      Assessment:    Fullness of pancreatic duct. Elevated amylase.   I discussed this case with Dr. Karilyn Cota     Plan:    Have Creon Rx filled. Dr. Karilyn Cota will review the CT scan. Will schedule an  EUS in [place] with Dr. Perry Mount. Further recommendations to follow. May need an ERCP with sphincterotomy.  Samples of Creon 24,000 given to patient. She is satisfied with her care today.

## 2011-03-10 ENCOUNTER — Telehealth: Payer: Self-pay

## 2011-03-10 DIAGNOSIS — R933 Abnormal findings on diagnostic imaging of other parts of digestive tract: Secondary | ICD-10-CM

## 2011-03-10 NOTE — Telephone Encounter (Signed)
Pt has been instructed and meds reviewed she will call with any questions Annice Pih at [facility] will call pt with pre appt

## 2011-03-10 NOTE — Telephone Encounter (Signed)
Message copied by Donata Duff on Wed Mar 10, 2011  9:35 AM ------      Message from: Rob Bunting P      Created: Wed Mar 10, 2011  9:25 AM       Dr. Karilyn Cota has referred her for EUS.            Can you schedule upper EUS, radial +/- linear, with propofol, next available [facility] EUS day.  Dx: abnormal pancreatic duct.            thanks

## 2011-03-19 LAB — MISCELLANEOUS TEST: Miscellaneous Test: 84715

## 2011-03-19 NOTE — Consult Note (Signed)
NAMEJACY, BROCKER               ACCOUNT NO.:  [id number]  MEDICAL RECORD NO.:  [id number]  LOCATION:  1425                         FACILITY:  [facility]  PHYSICIAN:  Lowella Dell, MD    DATE OF BIRTH:  August 13, 1940  DATE OF CONSULTATION:  02/16/2011 DATE OF DISCHARGE:                                CONSULTATION   REASON FOR CONSULTATION:  Amyloidosis.  HISTORY OF PRESENT ILLNESS:  Ms. Cissell is a very pleasant 70 year old [place], [place], woman with a possible diagnosis of amyloidosis, based on outside bone marrow biopsy, which at this time is not available for review, as well as a history of elevated lambda light chains, followed by Dr. Isabel Caprice in [place], [place].  The patient was treated with Velcade, the first dose on February 07, 2009, every week, along with dexamethasone 20 mg b.i.d. or as directed.  The last dose being administered on June 10, 2009.  The patient was admitted after developing severe diarrhea, which is believed to be secondary to amyloidosis, requiring several hospitalizations, including a perforated cecum, which required right colectomy, as well as another hospitalization requiring ileostomy.  We were asked to see her in consultation while hospitalized.  The patient at this time has been treated with octreotide, which has improved her symptoms.  The full GI evaluation is in process.  Of note, per Dr. Bertha Stakes notes, her diarrhea occurs even when the patient is in remission from her possible amyloidosis.  PAST MEDICAL HISTORY: 1. Primary amyloidosis. 2. History of secretory diarrhea, believed to be secondary to     amyloidosis per chart report. 3. History of C difficile colitis in the recent past, now negative on     this admission. 4. CAD. 5. Hypertension. 6. Anemia of chronic disease, microcytic, as well as a history of iron-     deficiency anemia.  The patient has been receiving Aranesp at the     [facility] office dating back to at  least 2007. 7. Hyperlipidemia. 8. Diastolic CH, LVH.  Ejection fraction 65% to 70%. 9. Chronic renal insufficiency. 10.Chronic dysphagia after undergoing esophageal dilatation. 11.History of secretory diarrhea. 12.Left diverticular history per colonoscopy in October 2011. 13.History of left thyroid mass. 14.Prior history of ventilator-dependent respiratory failure secondary     to sepsis in June 2011. 15.Known monoclonal lambda light chains per [facility] in January 2012.  PAST SURGICAL HISTORY: 1. Status post hysterectomy, vaginal, at age 71, secondary to     fibroids.  The patient has ovaries in place. 2. Status post bilateral carotid endarterectomies 2008. 3. Status post tonsillectomy and adenoidectomy. 4. Status post left ileostomy in January 2011. 5. Status post right hemicolectomy, Dr. Lovell Sheehan on November 16, 2009,     secondary to perforated cecum. 6. Status post left eye surgery, for amblyopia. 7. Status post loop recorder, implantable, for monitoring cardiac     rhythm.  ALLERGIES:  LACTOSE intolerance.  MEDICATIONS:  Elavil, aspirin, vitamin D, folic acid, heparin, Toprol, multivitamins, simvastatin, Protonix, Lyrica, Xifaxan, Crestor, Ambien, Tylenol, Benadryl, Norco, Imodium, Maalox, Mag-Ox, Zofran, and Mylanta.  REVIEW OF SYSTEMS:  She denies any fever, chills, night sweats or headaches.  No mental status changes.  No  vision changes.  She has chronic dysphagia.  She is dyspneic on exertion, but not short of breath at rest.  No productive cough.  She has intermittent palpitations for which she has a recorder.  She had cramping secondary to diarrhea, now resolving.  She has decreased appetite, and weight loss secondary to diarrhea.  She is fatigued.  She has GERD symptoms.  Occasional nausea, but no vomiting.  She has chronic diarrhea, as mentioned in the HPI.  At this time, it is better controlled.  No blood in the stools or in the urine.  No gum or nose bleed.  She was very  dehydrated on admission, but this is now resolved.  The rest of the review of systems is negative.  FAMILY HISTORY:  Mother died at 63 with MI.  Father died at 38 with MI. She has one brother who is alive with a history of CVA.  No sisters. One aunt with cancer of unknown type, one maternal aunt with breast cancer and two first cousins with bone cancer.  SOCIAL HISTORY:  The patient is divorced.  Lives alone, but she has a caretaker, to assist her with her daily needs.  She is [place]. Retired from [employer].  [place] in [place]  She has one daughter who lives in [place] who is close to her.  She is full code. 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  Health maintenance:  The patient quit tobacco in 1980.  No alcohol or recreational drugs.  Her last colonoscopy was in October 2011, and her EGD was in November 2011.  She is up-to-date with physicals.  She does not recall when her last mammogram was or her bone density scan.  PHYSICAL EXAMINATION:  GENERAL:  This is a thin 70 year old African American female in no acute distress, alert and oriented x3. VITAL SIGNS:  Blood pressure 134/74, pulse 98, respirations 18, temperature 97.6, O2 saturation 100% in room air.  Weight 44.9 kg, height 63 inches. HEENT:  Normocephalic, atraumatic, although there is some temporal wasting.  Oral cavity without thrush or lesions.  The patient is edentulous. NECK:  Supple.  No cervical or supraclavicular masses. LUNGS:  Essentially clear to auscultation without wheezing, rhonchi or rales.  No axillary masses. CARDIOVASCULAR:  Regular rate and rhythm with a 2/6 systolic murmur.  No rubs or gallops. ABDOMEN:  Soft, nontender.  Bowel sounds x4.  No apparent hepatosplenomegaly.  There is presence of left colostomy and right ileostomy, followed by GI. GU and RECTAL:  Deferred. EXTREMITIES:  No  clubbing or cyanosis.  No edema.  No inguinal masses. SKIN:  Without any other areas of lesions, bruising or petechial rash. BREASTS:  Not examined. NEUROLOGIC:  Nonfocal.  LABORATORY DATA:  Hemoglobin 9.2, hematocrit 26.7, white count 4.8, platelets 189, MCV 89.9, sodium 138, potassium 3.8, BUN 16, creatinine 0.92, glucose 102.  Total bilirubin 0.4, alkaline phosphatase 161, AST 25, ALT 23, total protein 8.8, albumin 4.6, calcium 8.3, magnesium 1.4. UA with 30 of protein and small leukocytes.  C difficile negative.  MRSA screening negative.  ASSESSMENT AND PLAN:  Dr. Darnelle Catalan has seen and evaluated the patient. This is a pleasant 70 year old [place], [place], woman with a possible diagnosis of amyloidosis based on outside bone marrow biopsy, which is not available for review, as well as a history of elevated lambda light chains, followed by Dr. Isabel Caprice in  [place], [place].  He treated her remotely with bortezomib and dexamethasone. The patient has a persistent diarrhea, said to improve with octreotide. Full GI evaluation is in process, but per Dr. Bertha Stakes notes, her diarrhea occurs even when the patient is in remission from her possible amyloidosis.  We will send serum and urine studies today for evaluation, but even if positive, doubt that this would explain the patient's diarrhea symptoms.  Of note, there is no mention of amyloid in the esophageal biopsy performed in December 2011. When the patient is ready for discharge, we will arrange for followup at [facility] [place], under Dr. Lynett Fish care, telephone number [phone].  Thank you very much for allowing [place] the opportunity to participate in the care of Ms. Zetina.    Marlowe Kays, PA-C   Lowella Dell, MD   SW/MEDQ  D:  02/17/2011  T:  02/17/2011  Job:  [id number]  cc:   Ernestina Penna, M.D. Fax: [phone]  Lynett Fish, M.D. Fax: [phone]  Lionel December,  M.D. Fax: [phone]  Di Kindle. Edilia Bo, M.D. [address] [place] [address]   Electronically Signed by Marlowe Kays P.A. on 02/19/2011 03:13:37 PM Electronically Signed by Ruthann Cancer M.D. on 03/19/2011 09:54:44 AM

## 2011-03-23 ENCOUNTER — Telehealth: Payer: Self-pay | Admitting: Gastroenterology

## 2011-03-23 NOTE — Telephone Encounter (Signed)
[name], I spoke with Dr. Cleone Slim, would like to move her Nov 8th EUS up.  Can you look at my upcoming hosp week for propofol assisted EUS anytime that week? (Tues, Thursday?) , radial +/-linear, for abnormal pancreas, abd pain, failure to thrive.  Let me know, thanks

## 2011-03-24 ENCOUNTER — Telehealth: Payer: Self-pay

## 2011-03-24 NOTE — Telephone Encounter (Signed)
Pt aware and reinstructed

## 2011-03-24 NOTE — Telephone Encounter (Signed)
EUS changed to 04/06/11 1115 am  915 am arrival time.  Left message on machine to call back

## 2011-03-24 NOTE — Telephone Encounter (Signed)
Pt appt moved to 04/06/11 pt aware and instructed

## 2011-03-25 LAB — CBC
HCT: 33.1 — ABNORMAL LOW
Hemoglobin: 8.8 — ABNORMAL LOW
MCHC: 33.6
MCV: 81.3
Platelets: 180
RBC: 3.21 — ABNORMAL LOW
RDW: 16.6 — ABNORMAL HIGH
WBC: 6.2

## 2011-03-25 LAB — COMPREHENSIVE METABOLIC PANEL
Albumin: 4.6
Alkaline Phosphatase: 107
BUN: 9
Creatinine, Ser: 0.89
GFR calc Af Amer: >60
Potassium: 3.9
Total Protein: 7.5

## 2011-03-25 LAB — BASIC METABOLIC PANEL
CO2: 26
Chloride: 107
GFR calc Af Amer: >60
Sodium: 139

## 2011-03-25 LAB — URINALYSIS, ROUTINE W REFLEX MICROSCOPIC
Protein, ur: NEGATIVE
Urobilinogen, UA: 0.2

## 2011-03-25 LAB — URINE MICROSCOPIC-ADD ON
RBC / HPF: 0
WBC, UA: 7 — ABNORMAL HIGH

## 2011-03-25 LAB — PROTIME-INR: INR: 1

## 2011-03-25 LAB — APTT: aPTT: 31

## 2011-04-01 ENCOUNTER — Encounter: Payer: Medicare Other | Admitting: *Deleted

## 2011-04-01 ENCOUNTER — Emergency Department (HOSPITAL_COMMUNITY): Payer: Medicare Other

## 2011-04-01 ENCOUNTER — Ambulatory Visit: Payer: Medicare Other | Admitting: Cardiology

## 2011-04-01 ENCOUNTER — Inpatient Hospital Stay (HOSPITAL_COMMUNITY)
Admission: EM | Admit: 2011-04-01 | Discharge: 2011-04-04 | DRG: 392 | Disposition: A | Discharge status: Home / Self Care | Payer: Medicare Other | Attending: Internal Medicine | Admitting: Internal Medicine

## 2011-04-01 DIAGNOSIS — A088 Other specified intestinal infections: Principal | ICD-10-CM | POA: Diagnosis present

## 2011-04-01 DIAGNOSIS — I10 Essential (primary) hypertension: Secondary | ICD-10-CM | POA: Diagnosis present

## 2011-04-01 DIAGNOSIS — E876 Hypokalemia: Secondary | ICD-10-CM | POA: Diagnosis present

## 2011-04-01 DIAGNOSIS — D6959 Other secondary thrombocytopenia: Secondary | ICD-10-CM | POA: Diagnosis present

## 2011-04-01 DIAGNOSIS — K861 Other chronic pancreatitis: Secondary | ICD-10-CM | POA: Diagnosis present

## 2011-04-01 DIAGNOSIS — E639 Nutritional deficiency, unspecified: Secondary | ICD-10-CM | POA: Diagnosis present

## 2011-04-01 DIAGNOSIS — N179 Acute kidney failure, unspecified: Secondary | ICD-10-CM | POA: Diagnosis present

## 2011-04-01 DIAGNOSIS — Z932 Ileostomy status: Secondary | ICD-10-CM

## 2011-04-01 DIAGNOSIS — T45515A Adverse effect of anticoagulants, initial encounter: Secondary | ICD-10-CM | POA: Diagnosis present

## 2011-04-01 DIAGNOSIS — Z933 Colostomy status: Secondary | ICD-10-CM

## 2011-04-01 DIAGNOSIS — E8589 Other amyloidosis: Secondary | ICD-10-CM | POA: Diagnosis present

## 2011-04-01 DIAGNOSIS — D649 Anemia, unspecified: Secondary | ICD-10-CM | POA: Diagnosis present

## 2011-04-01 LAB — DIFFERENTIAL
Eosinophils Relative: 2 % (ref 0–5)
Lymphocytes Relative: 29 % (ref 12–46)
Lymphs Abs: 1.5 10*3/uL (ref 0.7–4.0)
Neutro Abs: 3.1 10*3/uL (ref 1.7–7.7)

## 2011-04-01 LAB — COMPREHENSIVE METABOLIC PANEL
ALT: 21 U/L (ref 0–35)
AST: 22 U/L (ref 0–37)
Alkaline Phosphatase: 129 U/L — ABNORMAL HIGH (ref 39–117)
CO2: 18 mEq/L — ABNORMAL LOW (ref 19–32)
Calcium: 9 mg/dL (ref 8.4–10.5)
Chloride: 106 mEq/L (ref 96–112)
GFR calc Af Amer: 26 mL/min — ABNORMAL LOW (ref >90)
GFR calc non Af Amer: 23 mL/min — ABNORMAL LOW (ref >90)
Glucose, Bld: 101 mg/dL — ABNORMAL HIGH (ref 70–99)
Potassium: 5.2 mEq/L — ABNORMAL HIGH (ref 3.5–5.1)
Sodium: 133 mEq/L — ABNORMAL LOW (ref 135–145)
Total Bilirubin: 0.5 mg/dL (ref 0.3–1.2)

## 2011-04-01 LAB — URINALYSIS, ROUTINE W REFLEX MICROSCOPIC
Bilirubin Urine: NEGATIVE
Hgb urine dipstick: NEGATIVE
Ketones, ur: NEGATIVE mg/dL
Nitrite: NEGATIVE
Protein, ur: 30 mg/dL — AB
Urobilinogen, UA: 0.2 mg/dL (ref 0.0–1.0)

## 2011-04-01 LAB — CBC
HCT: 39.7 % (ref 36.0–46.0)
Hemoglobin: 13.5 g/dL (ref 12.0–15.0)
MCV: 89 fL (ref 78.0–100.0)
RDW: 12.6 % (ref 11.5–15.5)
WBC: 5.1 10*3/uL (ref 4.0–10.5)

## 2011-04-01 LAB — POCT I-STAT TROPONIN I

## 2011-04-02 NOTE — H&P (Signed)
[name], [name] NO.:  [id number]  MEDICAL RECORD NO.:  [id number]  LOCATION:  [facility]                         FACILITY:  [facility]  PHYSICIAN:  Vania Rea, M.D. DATE OF BIRTH:  [birth year]  DATE OF ADMISSION:  04/01/2011 DATE OF DISCHARGE:                             HISTORY & PHYSICAL   PRIMARY CARE PHYSICIAN:  Ernestina Penna, MD  GASTROENTEROLOGIST:  Lionel December, MD, at [facility].  ONCOLOGIST:  Lynett Fish, MD  CHIEF COMPLAINT:  Diarrhea for the past 2 weeks, nausea and vomiting for the past 3 days.  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  She is now status post colostomy and ileostomy and has had several admissions related to acute renal failure from dehydration due to excess ostomy output.  The patient was last admitted in early September.  The patient reports that since discharge she has had her octreotide increased.  She has also been started on Creon as there is a suspicion that she is having a pancreatic problem and has been fairly stable until 2 weeks ago when she noted everything she eats or drinks immediately comes right out her ileostomy and colostomy.  She is having to change the bags about every 30 minutes, thus she is producing a profuse watery diarrhea, no blood.  She has solved the problem by not eating for the past 3 days and not drinking anything since yesterday.  She, however, has been having nausea and vomiting for the past 3 days.  No blood in the vomitus.  She has visited [facility] 3 times in the past week to get IV fluid hydration, but when she returns home her problems persist.  Also complained of diffuse body aches and back pain radiating down into her right leg.  Denies any severe  colicky abdominal pain.  PAST MEDICAL HISTORY: 1. Amyloid cardiomyopathy. 2. Hypertension. 3. Hyperlipidemia. 4. Status post remote chemotherapy for intestinal amyloid, status post     bowel resection for complications of chemotherapy. 5. Recurrent episodes of acute renal failure related to dehydration. 6. Past history of C. diff.  MEDICATIONS: 1. Creon 1 capsule 3 times daily between meals. 2. Megace 400 mg twice daily. 3. Ambien 10 mg at bedtime as needed. 4. Vitamin D3 1 tablet daily. 5. Simethicone 125 mg daily as needed. 6. Protonix 40 mg twice daily. 7. Zofran 8 mg 3 times daily. 8. Octreotide 100 mcg subcu 3 times daily. 9. Multivitamins 1 daily. 10.Toprol-XL 25 mg daily. 11.Lyrica 50 mg daily. 12.Lisinopril 5 mg daily. 13.Imodium as needed. 14.Hydrocodone/acetaminophen 5/325 one tablet every 4 hours. 15.Folic acid 1 mg daily. 16.Atorvastatin 40 mg daily. 17.Aspirin 81 mg daily. 18.Amitriptyline 25 mg at bedtime.  ALLERGIES:  No known drug allergies.  She is lactose intolerant.  SOCIAL HISTORY:  Lives alone, has [facility] coming in to help her.  She uses a cane and a walker for ambulation.  Denies tobacco, alcohol, or illicit drug use.  FAMILY HISTORY:  Significant for diabetes and  coronary artery disease.  REVIEW OF SYSTEMS:  Other than noted above unremarkable.  PHYSICAL EXAM:  GENERAL:  Emaciated, but pleasant elderly African American lady, lying in the stretcher. VITAL SIGNS:  Temperature is 98.3, pulse 105, respirations 20, blood pressure 126/76.  She is saturating 98% on 2 L. HEENT:  Her pupils are round and equal.  Mucous membranes are pink, dry, and anicteric.  No cervical lymphadenopathy or thyromegaly.  No carotid bruit. CHEST:  Clear to auscultation bilaterally.  She has a loop recorder implanted on the left upper chest. CARDIOVASCULAR:  Regular rhythm.  She has a 2/6 systolic murmur. ABDOMEN:  Scaphoid and soft.  She has a broad midline  abdominal scar. She has colostomy and ileostomy bags at both sites of the abdomen.  The bags are completely flat and empty.  She has had no output since the hour she has been in the emergency room. EXTREMITIES:  She has no edema. MUSCULOSKELETAL:  She has generalized wasting. CENTRAL NERVOUS SYSTEM:  Cranial nerves II-XII grossly intact.  She has no focal lateralizing signs.  LABS:  Her white count is 5.1, hemoglobin 13.5, and platelets 195.  She has a normal differential.  Her sodium is 133, potassium 5.2, chloride 106, CO2 of 18, glucose 101, BUN 42, creatinine 2.13.  Alk phos is 129, the rest of her liver functions are unremarkable.  Her calcium is 9.0. Total protein 6.8, albumin 4.0.  Urinalysis shows trace leukocyte esterase, 30 proteins, negative for ketones, otherwise unremarkable. Lipase is elevated at 172, upper limits of normal being 59.  Her lactic acid is normal at 1.1.  ASSESSMENT: 1. Acute renal failure, related to dehydration. 2. Nausea, vomiting, and diarrhea, possibly related to pancreatitis,     possible aggravation of her secretory diarrhea. 3. Hypertension, controlled. 4. Hyperlipidemia. 5. Amyloid cardiomyopathy.  PLAN:  We will admit this lady for hydration.  We will keep her n.p.o. If she has any stool, we will check for C. diff, but will give her the benefit of a consult with her Dr. Rob Bunting with whom she says she is scheduled to have ERCP in the coming days.  Other plans as per orders.     Vania Rea, M.D.     LC/MEDQ  D:  04/01/2011  T:  04/01/2011  Job:  [id number]  cc:   Lionel December, M.D. Fax: [phone]  Rachael Fee, MD [address] [place], [place] [address]  Ernestina Penna, M.D. Fax: [phone]  Lynett Fish, M.D. Fax: [phone]

## 2011-04-03 LAB — CBC
HCT: 24.7 % — ABNORMAL LOW (ref 36.0–46.0)
MCHC: 32.8 g/dL (ref 30.0–36.0)
MCV: 90.8 fL (ref 78.0–100.0)
Platelets: 115 10*3/uL — ABNORMAL LOW (ref 150–400)
RDW: 12.8 % (ref 11.5–15.5)
WBC: 3.3 10*3/uL — ABNORMAL LOW (ref 4.0–10.5)

## 2011-04-03 LAB — COMPREHENSIVE METABOLIC PANEL
AST: 18 U/L (ref 0–37)
Albumin: 2.9 g/dL — ABNORMAL LOW (ref 3.5–5.2)
BUN: 20 mg/dL (ref 6–23)
Chloride: 111 mEq/L (ref 96–112)
Creatinine, Ser: 1.16 mg/dL — ABNORMAL HIGH (ref 0.50–1.10)
Total Protein: 5.2 g/dL — ABNORMAL LOW (ref 6.0–8.3)

## 2011-04-03 LAB — MAGNESIUM: Magnesium: 1.2 mg/dL — ABNORMAL LOW (ref 1.5–2.5)

## 2011-04-03 LAB — DIFFERENTIAL
Eosinophils Absolute: 0.2 10*3/uL (ref 0.0–0.7)
Eosinophils Relative: 5 % (ref 0–5)
Lymphs Abs: 1 10*3/uL (ref 0.7–4.0)
Monocytes Absolute: 0.3 10*3/uL (ref 0.1–1.0)
Neutrophils Relative %: 54 % (ref 43–77)

## 2011-04-03 LAB — URINE CULTURE
Colony Count: 30000
Culture  Setup Time: 201210190241

## 2011-04-04 LAB — COMPREHENSIVE METABOLIC PANEL
AST: 23 U/L (ref 0–37)
BUN: 7 mg/dL (ref 6–23)
CO2: 24 mEq/L (ref 19–32)
Chloride: 109 mEq/L (ref 96–112)
Creatinine, Ser: 0.96 mg/dL (ref 0.50–1.10)
GFR calc Af Amer: 68 mL/min — ABNORMAL LOW (ref >90)
GFR calc non Af Amer: 59 mL/min — ABNORMAL LOW (ref >90)
Glucose, Bld: 133 mg/dL — ABNORMAL HIGH (ref 70–99)
Total Bilirubin: 0.7 mg/dL (ref 0.3–1.2)

## 2011-04-04 LAB — CBC
HCT: 29.7 % — ABNORMAL LOW (ref 36.0–46.0)
Hemoglobin: 10.5 g/dL — ABNORMAL LOW (ref 12.0–15.0)
MCH: 30.2 pg (ref 26.0–34.0)
MCV: 85.3 fL (ref 78.0–100.0)
Platelets: 128 10*3/uL — ABNORMAL LOW (ref 150–400)
RBC: 3.48 MIL/uL — ABNORMAL LOW (ref 3.87–5.11)
WBC: 5.4 10*3/uL (ref 4.0–10.5)

## 2011-04-04 LAB — CROSSMATCH
ABO/RH(D): AB NEG
Antibody Screen: NEGATIVE

## 2011-04-05 NOTE — Discharge Summary (Signed)
[name], [name] NO.:  [id number]  MEDICAL RECORD NO.:  [id number]  LOCATION:  1513                         FACILITY:  [facility]  PHYSICIAN:  Talmage Nap, MD  DATE OF BIRTH:  06-14-1941  DATE OF ADMISSION:  04/01/2011 DATE OF DISCHARGE:  04/04/2011                        DISCHARGE SUMMARY - REFERRING   PRIMARY CARE PHYSICIAN:  Ernestina Penna, MD  PRIMARY GASTROENTEROLOGIST:  Lionel December, MD  PRIMARY ONCOLOGIST:  Lynett Fish, MD  DISCHARGE DIAGNOSES: 1. Gastroenteritis, most likely viral, resolved. 2. Dehydration/acute renal failure, resolved. 3. Anemia, status post packed red blood cell transfusion, hemoglobin     and hematocrit stable, 10.5 g on discharge. 4. Thrombocytopenia, questionable secondary to Lovenox use. 5. Hypomagnesemia, corrected. 6. Hypokalemia. 7. Hypertension. 8. 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  HISTORY:  The patient is a 70 year old African-American female with history of amyloid cardiomyopathy and intestinal amyloidosis, status post resection with ileo as well as colostomy; was admitted to the hospital on April 01, 2011, by Dr. Vania Rea with 2-week history of ostomy diarrhea.  She, however, denied any history of nausea or vomiting.  The patient claimed to have been having episodes of nausea and vomiting.  Vomiting was nonbloody.  She denied any fever, she denied any chills, she denied any rigor.  She also denied any history of blood in the ileostomy as well as in the colostomy bag.  The patient, however, complained about aches and pains.  She denied any associated abdominal discomfort.  The ostomy diarrhea was said to have persisted, hence the patient presented to the hospital for evaluation.  Please for preadmission meds, allergies, social history, family history, and review of  systems; refer to the initial history and physical dictated by Dr. Orvan Falconer on April 01, 2011.  PHYSICAL EXAMINATION:  GENERAL:  At the time the patient was seen by the admitting physician, she was said to be nauseated, but pleasant and not in any respiratory distress. VITAL SIGNS:  Temperature is 98.3, pulse is 105, respiratory rate 20, blood pressure 123/76, and she was said to be saturating at 98% on O2 via nasal cannula 2 L/minute. HEENT:  Pupils are reactive to light and extraocular muscles are intact. Buccal mucosa was said to be dry. NECK:  No jugular venous distention.  No carotid bruit.  No lymphadenopathy. CHEST:  Clear to auscultation with loop recorder implanted device on the left upper thorax. HEART:  Sounds are 1 and 2 with soft systolic murmur. ABDOMEN:  Said to be soft with prominent midline abdominal scar with ileostomy and colostomy bag in situ.  Liver, spleen, kidney not palpable.  Bowel sounds are positive. EXTREMITIES:  Showed no pedal edema. NEUROLOGIC EXAM:  Did not show any new lateralizing sign. Neuropsychiatric evaluation unremarkable. MUSCULOSKELETAL SYSTEM:  Showed wasting of the muscle of the lower extremities. SKIN:  Showed decreased turgor.  LAB DATA:  Cardiac marker, troponin I 0.02.  Complete blood count with differential showed WBC of 5.1, hemoglobin 13.5, hematocrit 39.7, MCV 89.0, platelet count of 195.  Lactic acid level 1.1.  Lipase 172. Comprehensive metabolic panel showed sodium of  133, potassium of 5.2, chloride of 106 with a bicarbonate of 18, glucose is 101, BUN is 42, creatinine is 2.13.  Urinalysis showed small leukocyte esterase with negative nitrite.  Urine microscopy showed wbc's 7 to 10 with few bacteria.  Clostridium difficile by PCR negative.  Urine culture showed multiple bacterial morphotypes present, non-predominant.  A repeat complete blood count with differential done on April 03, 2011, showed sodium of 139, potassium of  3.5, chloride of 111, bicarbonate is 21, glucose is 148, BUN is 20, creatinine is 1.16, magnesium level is 1.2. A repeat complete blood count with differential done on April 03, 2011, showed WBC of 3.3, hemoglobin of 8.1, hematocrit of 24.7, MCV of 90.8 with a platelet count of 115.  A repeat complete blood count with no differential done on April 04, 2011, showed WBC of 5.4, hemoglobin 10.5, hematocrit of 29.7, MCV of 85.3 with a platelet count of 128. Comprehensive metabolic panel showed sodium of 140, potassium of 3.1, chloride of 109 with a bicarbonate of 24, glucose is 133, BUN is 7, creatinine is 0.96.  Magnesium level is 1.8.  Imaging studies done include acute abdominal series which showed no acute findings in the chest or abdomen.  HOSPITAL COURSE:  The patient was admitted to general medical floor. She was started on D5 normal saline with banana bag which included thiamine 100 mg, folate 1 mg as well as multivitamin 1 ampule to go at a rate of 150 cc an hour; and DVT prophylaxis was done with Lovenox 30 mg subcu q.24.  The patient was also given Zofran 4 mg IV q.6 hours p.r.n., nausea and pain control was done with Dilaudid 0.5 to 1 mg IV q.4 p.r.n. Other medication given to the patient include Elavil 25 mg p.o. at bedtime and octreotide 100 mcg subcu q.8 hours.  She was also given Ambien 5 mg p.o. at bedtime for insomnia.  The patient was, however, seen by me for the very first time in this admission on April 02, 2011; and during this encounter, the frequency of diarrhea has improved remarkably.  There was no history of vomiting.  The patient was started on clear liquid diet and this was advanced as tolerated.  She was reevaluated again by me on April 03, 2011; and during this encounter, the patient's diarrhea has reduced remarkably.  A total evaluation of her hematological indices as well as chemistry showed the patient to be hypomagnesemic.  She was given magnesium  sulfate 2 g IV over 1 hour x2 doses; and since she was also found to be anemic, she was typed and crossed, transfused with 1 unit of packed RBC stat.  Her diet was advanced to a regular diet.  Because of the precipitous drop of hemoglobin which was most likely attributed to the Lovenox, Lovenox was discontinued and SCD boots were used for DVT prophylaxis.  The patient was reevaluated by me today which is April 04, 2011, feels better. Denied any complaint.  Examination of the patient was essentially unremarkable.  Vital signs; blood pressure is 154/65 after initial recording of 173/86, pulse is 78, respiratory rate 20, temperature is 97.9, medically stable.  Plan is for the patient to be discharged home today on activity as tolerated.  Low-sodium, low-cholesterol diet.  DISCHARGE MEDICATIONS:  Medications to be taken at home include; 1. Potassium chloride 10 mEq 1 p.o. b.i.d. 2. Amitriptyline 25 mg 1 p.o. daily at bedtime. 3. Aspirin enteric-coated 81 mg 1 p.o. daily. 4. Atorvastatin 40 mg  1 p.o. daily at bedtime. 5. Creon (amylase/lipase/protease) 1 capsule p.o. t.i.d. with meals. 6. Folic acid 1 mg p.o. daily. 7. Hydrocodone/APAP 5/325 one p.o. q.4 p.r.n. 8. Imodium (loperamide) 1 to 2 capsules p.o. daily p.r.n. 9. Lisinopril 5 mg 1 p.o. daily. 10.Lyrica (pregabalin) 50 mg 1 capsule p.o. daily. 11.Megestrol oral suspension 200 mg/10 cc p.o. b.i.d. with meals. 12.Metoprolol succinate 25 mg 1 p.o. daily. 13.Multivitamin 1 tablet p.o. daily. 14.Octreotide injection 100 mcg per meal 1 cc subcu q.8 hours. 15.Ondansetron 18 mg 1 p.o. t.i.d. 16.Protonix 40 mg 1 p.o. b.i.d. 17.Simethicone 125 mg 1 capsule p.o. daily p.r.n. 18.Vitamin D3 of 1000 units 1 tablet p.o. daily. 19.Zolpidem 10 mg 1 p.o. daily at bedtime p.r.n. for insomnia.     Talmage Nap, MD     CN/MEDQ  D:  04/04/2011  T:  04/04/2011  Job:  [id number]  cc:   Ernestina Penna, M.D. Fax: [phone]  Electronically  Signed by Talmage Nap  on 04/05/2011 06:58:20 AM

## 2011-04-06 ENCOUNTER — Other Ambulatory Visit (INDEPENDENT_AMBULATORY_CARE_PROVIDER_SITE_OTHER): Payer: Self-pay | Admitting: *Deleted

## 2011-04-06 ENCOUNTER — Encounter: Payer: Medicare Other | Admitting: Gastroenterology

## 2011-04-06 ENCOUNTER — Other Ambulatory Visit: Payer: Self-pay | Admitting: Gastroenterology

## 2011-04-06 ENCOUNTER — Ambulatory Visit (HOSPITAL_COMMUNITY)
Admission: RE | Admit: 2011-04-06 | Discharge: 2011-04-06 | Disposition: A | Discharge status: Home / Self Care | Payer: Medicare Other | Source: Ambulatory Visit | Attending: Gastroenterology | Admitting: Gastroenterology

## 2011-04-06 DIAGNOSIS — K2970 Gastritis, unspecified, without bleeding: Secondary | ICD-10-CM | POA: Insufficient documentation

## 2011-04-06 DIAGNOSIS — R748 Abnormal levels of other serum enzymes: Secondary | ICD-10-CM | POA: Insufficient documentation

## 2011-04-06 DIAGNOSIS — K8689 Other specified diseases of pancreas: Secondary | ICD-10-CM | POA: Insufficient documentation

## 2011-04-06 DIAGNOSIS — K838 Other specified diseases of biliary tract: Secondary | ICD-10-CM | POA: Insufficient documentation

## 2011-04-06 DIAGNOSIS — E859 Amyloidosis, unspecified: Secondary | ICD-10-CM | POA: Insufficient documentation

## 2011-04-06 DIAGNOSIS — K319 Disease of stomach and duodenum, unspecified: Secondary | ICD-10-CM | POA: Insufficient documentation

## 2011-04-06 DIAGNOSIS — R933 Abnormal findings on diagnostic imaging of other parts of digestive tract: Secondary | ICD-10-CM

## 2011-04-06 DIAGNOSIS — E8589 Other amyloidosis: Secondary | ICD-10-CM | POA: Insufficient documentation

## 2011-04-06 DIAGNOSIS — K8050 Calculus of bile duct without cholangitis or cholecystitis without obstruction: Secondary | ICD-10-CM | POA: Insufficient documentation

## 2011-04-06 DIAGNOSIS — R932 Abnormal findings on diagnostic imaging of liver and biliary tract: Secondary | ICD-10-CM

## 2011-04-06 HISTORY — DX: Shortness of breath: R06.02

## 2011-04-06 MED ORDER — CEFAZOLIN SODIUM 1 G IJ SOLR: 1.0000 g | INTRAMUSCULAR | Status: DC

## 2011-04-07 ENCOUNTER — Encounter (HOSPITAL_COMMUNITY): Payer: Self-pay | Admitting: *Deleted

## 2011-04-07 ENCOUNTER — Encounter (HOSPITAL_COMMUNITY): Payer: Medicare Other

## 2011-04-07 ENCOUNTER — Other Ambulatory Visit: Payer: Self-pay

## 2011-04-07 LAB — PROTIME-INR: INR: 1.18 (ref 0.00–1.49)

## 2011-04-07 MED ORDER — CIPROFLOXACIN IN D5W 400 MG/200ML IV SOLN: 400.0000 mg | INTRAVENOUS | Status: DC

## 2011-04-07 NOTE — Patient Instructions (Addendum)
20 [name]  04/07/2011   Your procedure is scheduled on:  04/09/2011  Report to [facility] at 0855 AM.  Call this number if you have problems the morning of surgery: [phone]   Remember:   Do not eat food:After Midnight.  Do not drink clear liquids: After Midnight.  Take these medicines the morning of surgery with A SIP OF WATER: vicodin, lisinopril, metoprolol, protonix, elavil   Do not wear jewelry, make-up or nail polish.  Do not wear lotions, powders, or perfumes. You may wear deodorant.  Do not shave 48 hours prior to surgery.  Do not bring valuables to the hospital.  Contacts, dentures or bridgework may not be worn into surgery.  Leave suitcase in the car. After surgery it may be brought to your room.  For patients admitted to the hospital, checkout time is 11:00 AM the day of discharge.   Patients discharged the day of surgery will not be allowed to drive home.  Name and phone number of your driver: driver/brother  Special Instructions: N/A   Please read over the following fact sheets that you were given: Pain Booklet, MRSA Information, Surgical Site Infection Prevention, Anesthesia Post-op Instructions and Care and Recovery After Surgery

## 2011-04-08 ENCOUNTER — Telehealth (INDEPENDENT_AMBULATORY_CARE_PROVIDER_SITE_OTHER): Payer: Self-pay | Admitting: *Deleted

## 2011-04-08 LAB — COMPREHENSIVE METABOLIC PANEL
AST: 21 U/L (ref 0–37)
Albumin: 3.8 g/dL (ref 3.5–5.2)
Alkaline Phosphatase: 118 U/L — ABNORMAL HIGH (ref 39–117)
Chloride: 101 mEq/L (ref 96–112)
Creatinine, Ser: 1.23 mg/dL — ABNORMAL HIGH (ref 0.50–1.10)
Potassium: 4.4 mEq/L (ref 3.5–5.1)
Total Bilirubin: 0.6 mg/dL (ref 0.3–1.2)
Total Protein: 6.3 g/dL (ref 6.0–8.3)

## 2011-04-08 LAB — CBC
MCHC: 33 g/dL (ref 30.0–36.0)
Platelets: 134 10*3/uL — ABNORMAL LOW (ref 150–400)
RDW: 13.4 % (ref 11.5–15.5)
WBC: 5 10*3/uL (ref 4.0–10.5)

## 2011-04-08 NOTE — Pre-Procedure Instructions (Signed)
Potassium of 3.1 shown to Dr Jayme Cloud and to Dr Karilyn Cota. Dr Karilyn Cota called potassium 20 mEq x2 doses in to [facility] in [place] for pt to take today. Will recheck potassium on arrival 04/09/2011. I-stat ordered per Dr Karilyn Cota. Dr Karilyn Cota also aware of pt/inr. No orders obtained for this.

## 2011-04-08 NOTE — Telephone Encounter (Signed)
[facility] in Day [facility] called to say that per Dr. Karilyn Cota ,[name] potassium was 3.1 when labs were checked on 04-07-11. He ask that the patient take 20 mEq times 2 today and they would recheck Potassium prior to ERCP 04-09-11.  I called to [facility] in [place] the following: Potassium 20 mEq take 2 today #5, and patient will be advised at procedure if any more doses are needed. The patient was called and made aware.

## 2011-04-09 ENCOUNTER — Encounter (HOSPITAL_COMMUNITY): Payer: Self-pay | Admitting: Anesthesiology

## 2011-04-09 ENCOUNTER — Ambulatory Visit (HOSPITAL_COMMUNITY): Payer: Medicare Other | Admitting: Anesthesiology

## 2011-04-09 ENCOUNTER — Encounter (HOSPITAL_COMMUNITY): Payer: Self-pay | Admitting: *Deleted

## 2011-04-09 ENCOUNTER — Encounter (HOSPITAL_COMMUNITY)
Admission: RE | Disposition: A | Discharge status: Home / Self Care | Payer: Self-pay | Source: Ambulatory Visit | Attending: Internal Medicine

## 2011-04-09 ENCOUNTER — Ambulatory Visit (HOSPITAL_COMMUNITY): Payer: Medicare Other

## 2011-04-09 ENCOUNTER — Ambulatory Visit (HOSPITAL_COMMUNITY)
Admission: RE | Admit: 2011-04-09 | Discharge: 2011-04-09 | Disposition: A | Discharge status: Home / Self Care | Payer: Medicare Other | Source: Ambulatory Visit | Attending: Internal Medicine | Admitting: Internal Medicine

## 2011-04-09 DIAGNOSIS — K8050 Calculus of bile duct without cholangitis or cholecystitis without obstruction: Secondary | ICD-10-CM | POA: Insufficient documentation

## 2011-04-09 DIAGNOSIS — K8070 Calculus of gallbladder and bile duct without cholecystitis without obstruction: Secondary | ICD-10-CM

## 2011-04-09 DIAGNOSIS — Z933 Colostomy status: Secondary | ICD-10-CM | POA: Insufficient documentation

## 2011-04-09 DIAGNOSIS — Z932 Ileostomy status: Secondary | ICD-10-CM | POA: Insufficient documentation

## 2011-04-09 DIAGNOSIS — E785 Hyperlipidemia, unspecified: Secondary | ICD-10-CM | POA: Insufficient documentation

## 2011-04-09 DIAGNOSIS — K831 Obstruction of bile duct: Secondary | ICD-10-CM

## 2011-04-09 DIAGNOSIS — I10 Essential (primary) hypertension: Secondary | ICD-10-CM | POA: Insufficient documentation

## 2011-04-09 HISTORY — PX: ERCP: SHX5425

## 2011-04-09 HISTORY — PX: SPHINCTEROTOMY: SHX5544

## 2011-04-09 LAB — POCT I-STAT 4, (NA,K, GLUC, HGB,HCT)
HCT: 37 % (ref 36.0–46.0)
Hemoglobin: 12.6 g/dL (ref 12.0–15.0)
Sodium: 139 mEq/L (ref 135–145)

## 2011-04-09 SURGERY — ERCP, WITH INTERVENTION IF INDICATED
Anesthesia: General

## 2011-04-09 MED ORDER — IOHEXOL 350 MG/ML SOLN
INTRAVENOUS | Status: DC | PRN
  Administered 2011-04-09: 50 mL

## 2011-04-09 MED ORDER — MIDAZOLAM HCL 2 MG/2ML IJ SOLN
1.0000 mg | INTRAMUSCULAR | Status: DC | PRN
  Administered 2011-04-09: 2 mg via INTRAVENOUS

## 2011-04-09 MED ORDER — CIPROFLOXACIN IN D5W 400 MG/200ML IV SOLN
400.0000 mg | INTRAVENOUS | Status: AC
  Administered 2011-04-09: 400 mg via INTRAVENOUS

## 2011-04-09 MED ORDER — GLYCOPYRROLATE 0.2 MG/ML IJ SOLN
INTRAMUSCULAR | Status: AC
  Filled 2011-04-09: qty 1

## 2011-04-09 MED ORDER — SUCCINYLCHOLINE CHLORIDE 20 MG/ML IJ SOLN
INTRAMUSCULAR | Status: DC | PRN
  Administered 2011-04-09: 90 mg via INTRAVENOUS

## 2011-04-09 MED ORDER — PROPOFOL 10 MG/ML IV EMUL
INTRAVENOUS | Status: AC
  Filled 2011-04-09: qty 20

## 2011-04-09 MED ORDER — ROCURONIUM BROMIDE 100 MG/10ML IV SOLN
INTRAVENOUS | Status: DC | PRN
  Administered 2011-04-09: 5 mg via INTRAVENOUS

## 2011-04-09 MED ORDER — ACETAMINOPHEN 325 MG PO TABS: 325.0000 mg | ORAL_TABLET | ORAL | Status: DC | PRN

## 2011-04-09 MED ORDER — GLYCOPYRROLATE 0.2 MG/ML IJ SOLN
0.2000 mg | Freq: Once | INTRAMUSCULAR | Status: AC | PRN
  Administered 2011-04-09: 0.2 mg via INTRAVENOUS

## 2011-04-09 MED ORDER — PROPOFOL 10 MG/ML IV EMUL
INTRAVENOUS | Status: DC | PRN
  Administered 2011-04-09: 90 mg via INTRAVENOUS

## 2011-04-09 MED ORDER — MIDAZOLAM HCL 2 MG/2ML IJ SOLN
INTRAMUSCULAR | Status: AC
  Filled 2011-04-09: qty 2

## 2011-04-09 MED ORDER — ROCURONIUM BROMIDE 50 MG/5ML IV SOLN
INTRAVENOUS | Status: AC
  Filled 2011-04-09: qty 1

## 2011-04-09 MED ORDER — GLUCAGON HCL (RDNA) 1 MG IJ SOLR
INTRAMUSCULAR | Status: DC | PRN
  Administered 2011-04-09: .25 mg via INTRAVENOUS

## 2011-04-09 MED ORDER — METOPROLOL TARTRATE 1 MG/ML IV SOLN
INTRAVENOUS | Status: AC
  Filled 2011-04-09: qty 5

## 2011-04-09 MED ORDER — ONDANSETRON HCL 4 MG/2ML IJ SOLN: 4.0000 mg | Freq: Once | INTRAMUSCULAR | Status: DC | PRN

## 2011-04-09 MED ORDER — PHENYLEPHRINE HCL 10 MG/ML IJ SOLN
INTRAMUSCULAR | Status: AC
  Filled 2011-04-09: qty 1

## 2011-04-09 MED ORDER — LIDOCAINE HCL (PF) 1 % IJ SOLN
INTRAMUSCULAR | Status: AC
  Filled 2011-04-09: qty 5

## 2011-04-09 MED ORDER — ONDANSETRON HCL 4 MG/2ML IJ SOLN
4.0000 mg | Freq: Once | INTRAMUSCULAR | Status: AC
  Administered 2011-04-09: 4 mg via INTRAVENOUS

## 2011-04-09 MED ORDER — LIDOCAINE HCL 1 % IJ SOLN
INTRAMUSCULAR | Status: DC | PRN
  Administered 2011-04-09: 30 mg via INTRADERMAL

## 2011-04-09 MED ORDER — FENTANYL CITRATE 0.05 MG/ML IJ SOLN: 25.0000 ug | INTRAMUSCULAR | Status: DC | PRN

## 2011-04-09 MED ORDER — FENTANYL CITRATE 0.05 MG/ML IJ SOLN
INTRAMUSCULAR | Status: DC | PRN
  Administered 2011-04-09 (×2): 50 ug via INTRAVENOUS

## 2011-04-09 MED ORDER — ONDANSETRON HCL 4 MG/2ML IJ SOLN
INTRAMUSCULAR | Status: AC
  Filled 2011-04-09: qty 2

## 2011-04-09 MED ORDER — STERILE WATER FOR IRRIGATION IR SOLN
Status: DC | PRN
  Administered 2011-04-09: 10:00:00

## 2011-04-09 MED ORDER — SUCCINYLCHOLINE CHLORIDE 20 MG/ML IJ SOLN
INTRAMUSCULAR | Status: AC
  Filled 2011-04-09: qty 1

## 2011-04-09 MED ORDER — LACTATED RINGERS IV SOLN
INTRAVENOUS | Status: DC
  Administered 2011-04-09: 10:00:00 via INTRAVENOUS

## 2011-04-09 MED ORDER — FENTANYL CITRATE 0.05 MG/ML IJ SOLN
INTRAMUSCULAR | Status: AC
  Filled 2011-04-09: qty 2

## 2011-04-09 MED ORDER — METOPROLOL TARTRATE 1 MG/ML IV SOLN
2.5000 mg | Freq: Once | INTRAVENOUS | Status: AC
  Administered 2011-04-09: 2.5 mg via INTRAVENOUS

## 2011-04-09 SURGICAL SUPPLY — 25 items
BAG HAMPER (MISCELLANEOUS) ×2 IMPLANT
BALLN RETRIEVAL 12X15 (BALLOONS) ×1 IMPLANT
BALN RTRVL 200 6-7FR 12-15 (BALLOONS) ×1
BASKET TRAPEZOID 3X6 (MISCELLANEOUS) IMPLANT
BSKT STON RTRVL TRAPEZOID 3X6 (MISCELLANEOUS)
DEVICE INFLATION ENCORE 26 (MISCELLANEOUS) IMPLANT
DEVICE LOCKING W-BIOPSY CAP (MISCELLANEOUS) ×2 IMPLANT
GUIDEWIRE HYDRA JAGWIRE .35 (WIRE) IMPLANT
GUIDEWIRE JAG HINI 025X260CM (WIRE) IMPLANT
KIT ROOM TURNOVER APOR (KITS) ×2 IMPLANT
LUBRICANT JELLY 4.5OZ STERILE (MISCELLANEOUS) ×2 IMPLANT
NDL HYPO 18GX1.5 BLUNT FILL (NEEDLE) ×1 IMPLANT
NEEDLE HYPO 18GX1.5 BLUNT FILL (NEEDLE) ×2 IMPLANT
PAD ARMBOARD 7.5X6 YLW CONV (MISCELLANEOUS) ×2 IMPLANT
PATHFINDER 450CM 0.18 (STENTS) ×1 IMPLANT
POSITIONER HEAD 8X9X4 ADT (SOFTGOODS) ×1 IMPLANT
SNARE ROTATE MED OVAL 20MM (MISCELLANEOUS) IMPLANT
SPHINCTEROTOME AUTOTOME .25 (MISCELLANEOUS) IMPLANT
SPONGE GAUZE 4X4 12PLY (GAUZE/BANDAGES/DRESSINGS) ×2 IMPLANT
SYR 3ML LL SCALE MARK (SYRINGE) ×2 IMPLANT
SYR 50ML LL SCALE MARK (SYRINGE) ×2 IMPLANT
SYSTEM CONTINUOUS INJECTION (MISCELLANEOUS) ×2 IMPLANT
WALLSTENT METAL COVERED 10X60 (STENTS) IMPLANT
WALLSTENT METAL COVERED 10X80 (STENTS) IMPLANT
WATER STERILE IRR 1000ML POUR (IV SOLUTION) ×2 IMPLANT

## 2011-04-09 NOTE — Anesthesia Postprocedure Evaluation (Signed)
  Anesthesia Post-op Note  Patient: [name]  Procedure(s) Performed:  ENDOSCOPIC RETROGRADE CHOLANGIOPANCREATOGRAPHY (ERCP) - 10:25; SPHINCTEROTOMY - with removal of multiple pigment stones  Patient Location: PACU  Anesthesia Type: General  Level of Consciousness: awake and oriented  Airway and Oxygen Therapy: Patient Spontanous Breathing  Post-op Pain: none  Post-op Assessment: Post-op Vital signs reviewed, Patient's Cardiovascular Status Stable, Respiratory Function Stable and No signs of Nausea or vomiting  Post-op Vital Signs: Reviewed and stable  Complications: No apparent anesthesia complications

## 2011-04-09 NOTE — H&P (Signed)
[name] is an 70 y.o. female.   Chief Complaint: History of pancreatitis. Choledocholithiasis. Asians here to have therapeutic ERCP. HPI: Patient is 70 year old African American female with complicated history who was amyloidosis under care of Dr. Margo Common was experiencing at least 2 episodes of pancreatitis with normal transaminases. On her imaging studies at [facility] in [facility] she did not have evidence of dilated bowel duct. [name] was seen in the office just over a month ago and be recommended EUS. This study was performed by Dr. Wendall Papa earlier this week and she had multiple stones in her common bile duct which is mildly dilated. This morning she denies fever chills or vomiting. She is vague periumbilical pain which she's had off and on. 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 She was admitted to this along hospital last week with gastroenteritis and felt to have viral infection. She did not have evidence of pancreatitis. She had her esophagus dilated at this facility few months ago and the she is not having any more problems with dysphagia.  Past Medical History  Diagnosis Date  . History of CEA (carotid endarterectomy) 03/2007    Left CEA by Dr. Cari Caraway  . Hypertension   . Hyperlipidemia   . GERD (gastroesophageal reflux disease)   . 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 anemia of chronic kidney disease though renal function does not seem severely impaired.  She had repeat bone marrow biopsy by Dr. Cleone Slim 9/10.  Marland Kitchen Episode of syncope     dysauthonomia/ orthostatic syncope - Patient has Medtronic loop recorder implanted 8/10.  No events so far - May be due only to orthostasis  . 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    . History of MRI 01/2009    CardiacMRI/EF 66%, mild to moderate concentric LVH, RV size & systolic function normAL  w/o evidence for ARVC, trivial pericardial effusion/On delayed enhancement, it was difficult to obtain the TI/There was patchy mid-wall basal delayed enhancement/There was partially circumferential subendocardialdelayed enhancement in the mid to apical LV/This was suggestive of infiltrative cardiomyopathy versus  . History of CT scan of chest 01/2009    to assess for sarcoidosis: no evidence by CT for pulmonary sarcoid  . 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   . Secretory diarrhea      treated with octreotide.   . Esophageal stricture 05/2010    with dilation  . Amyloidosis   . Shortness of breath     when tired    Past Surgical History  Procedure Date  . Implantable loop recorder 2010    MDT for unexplained syncope  . Partial colectomy 1/11    for diverticular perforation  . Carotid endarterectomy 10/08    left  . Abdominal hysterectomy   . Upper gastrointestinal endoscopy 05/20/2010    EGD ED  . Colonoscopy 03/14/2009    Family History  Problem Relation Age of Onset  . Heart failure Mother   . Heart failure Father   . Anesthesia problems Neg Hx   . Hypotension Neg Hx   . Malignant hyperthermia Neg Hx   . Pseudochol deficiency Neg Hx    Social History:  reports that she quit smoking about 32 years ago. Her smoking use included Cigarettes. She  has a 5 pack-year smoking history. She has never used smokeless tobacco. She reports that she does not drink alcohol or use illicit drugs.  Allergies: No Known Allergies  Medications Prior to Admission  Medication Dose Route Frequency Provider Last Rate Last Dose  . ciprofloxacin (CIPRO) IVPB 400 mg  400 mg Intravenous 120 min pre-op Malissa Hippo, MD   400 mg at 04/09/11 0916  . DISCONTD: ciprofloxacin (CIPRO) IVPB 400 mg  400 mg Intravenous 120 min pre-op Malissa Hippo, MD       Medications Prior to Admission  Medication  Sig Dispense Refill  . amitriptyline (ELAVIL) 25 MG tablet Take 25 mg by mouth at bedtime.        Marland Kitchen aspirin EC 81 MG tablet Take 1 tablet (81 mg total) by mouth daily.      Marland Kitchen atorvastatin (LIPITOR) 40 MG tablet TAKE ONE TABLET BY MOUTH ONE TIME DAILY  30 tablet  2  . Cholecalciferol (VITAMIN D) 1000 UNITS capsule Take 1,000 Units by mouth daily.       . ergocalciferol (VITAMIN D2) 50000 UNITS capsule Take 50,000 Units by mouth once a week. On sundays      . folic acid (FOLVITE) 1 MG tablet Take 1 mg by mouth daily.        Marland Kitchen HYDROcodone-acetaminophen (VICODIN) 5-500 MG per tablet Take 1 tablet by mouth every 4 (four) hours as needed. For pain       . lipase/protease/amylase (CREON-10/PANCREASE) 12000 UNITS CPEP Take 1 capsule by mouth 3 (three) times daily.        Marland Kitchen lisinopril (PRINIVIL,ZESTRIL) 5 MG tablet Take 5 mg by mouth daily.       Marland Kitchen loperamide (IMODIUM) 2 MG capsule Take 2 mg by mouth 4 (four) times daily as needed. For diarrhea       . LYRICA 50 MG capsule Take 50 mg by mouth daily as needed. For nerve pain      . megestrol (MEGACE) 40 MG/ML suspension Take 200 mg by mouth 2 (two) times daily before a meal.       . metoprolol succinate (TOPROL-XL) 25 MG 24 hr tablet Take 25 mg by mouth daily.       . Multiple Vitamins-Minerals (MULTIVITAMINS THER. W/MINERALS) TABS Take 1 tablet by mouth daily.        Marland Kitchen octreotide (SANDOSTATIN) 100 MCG/ML SOLN Inject 100 mcg into the skin 3 (three) times daily.        . ondansetron (ZOFRAN) 8 MG tablet Take 8 mg by mouth every 8 (eight) hours as needed. For nausea      . pantoprazole (PROTONIX) 40 MG tablet Take 40 mg by mouth 2 (two) times daily.        Marland Kitchen zolpidem (AMBIEN) 10 MG tablet Take 10 mg by mouth at bedtime as needed. For sleep      . hydrocodone-acetaminophen (LORCET-HD) 5-500 MG per capsule Take 1 capsule by mouth every 4 (four) hours as needed.        . Multiple Vitamin (MULTIVITAMIN) tablet Take 1 tablet by mouth daily.        Marland Kitchen octreotide  (SANDOSTATIN) 1000 MCG/ML SOLN       . rifaximin (XIFAXAN) 550 MG TABS Take 550 mg by mouth 3 (three) times daily.         Results for orders placed during the hospital encounter of 04/09/11 (from the past 48 hour(s))  PROTIME-INR     Status: Abnormal   Collection Time  04/07/11  3:45 PM      Component Value Range Comment   Prothrombin Time 15.3 (*) 11.6 - 15.2 (seconds)    INR 1.18  0.00 - 1.49    CBC     Status: Abnormal   Collection Time   04/07/11  3:45 PM      Component Value Range Comment   WBC 5.0  4.0 - 10.5 (K/uL)    RBC 3.45 (*) 3.87 - 5.11 (MIL/uL)    Hemoglobin 10.5 (*) 12.0 - 15.0 (g/dL)    HCT 46.9 (*) 62.9 - 46.0 (%)    MCV 92.2  78.0 - 100.0 (fL)    MCH 30.4  26.0 - 34.0 (pg)    MCHC 33.0  30.0 - 36.0 (g/dL)    RDW 52.8  41.3 - 24.4 (%)    Platelets 134 (*) 150 - 400 (K/uL)   COMPREHENSIVE METABOLIC PANEL     Status: Abnormal   Collection Time   04/07/11  3:45 PM      Component Value Range Comment   Sodium 137  135 - 145 (mEq/L)    Potassium 4.4  3.5 - 5.1 (mEq/L)    Chloride 101  96 - 112 (mEq/L)    CO2 29  19 - 32 (mEq/L)    Glucose, Bld 79  70 - 99 (mg/dL)    BUN 6  6 - 23 (mg/dL)    Creatinine, Ser 0.10 (*) 0.50 - 1.10 (mg/dL)    Calcium 9.1  8.4 - 10.5 (mg/dL)    Total Protein 6.3  6.0 - 8.3 (g/dL)    Albumin 3.8  3.5 - 5.2 (g/dL)    AST 21  0 - 37 (U/L)    ALT 13  0 - 35 (U/L)    Alkaline Phosphatase 118 (*) 39 - 117 (U/L)    Total Bilirubin 0.6  0.3 - 1.2 (mg/dL)    GFR calc non Af Amer 44 (*) >90 (mL/min)    GFR calc Af Amer 51 (*) >90 (mL/min)    No results found.  Review of Systems  Constitutional: Negative for fever, chills, weight loss (no weight loss in the last one month), malaise/fatigue and diaphoresis.  Gastrointestinal: Negative for heartburn, nausea, vomiting, constipation, blood in stool and melena. Abdominal pain: intermittent epigastric and mid abdominal pain. Diarrhea:  chronic diarrhea.  Neurological: Negative for weakness.     Blood pressure 196/112, pulse 80, temperature 98.1 F (36.7 C), temperature source Oral, resp. rate 23, height 5\' 3"  (1.6 m), weight 106 lb (48.081 kg), SpO2 100.00%. Physical Exam  Constitutional:       Pleasant well developed thin African American female who is in no acute distress.  HENT:  Mouth/Throat: Oropharynx is clear and moist.       She has few teeth in lower jaw.  Eyes: Conjunctivae are normal. No scleral icterus.  Neck: No JVD present. No thyromegaly present.  Cardiovascular: Normal rate and regular rhythm.  Exam reveals no gallop.   Murmur (grade 3/6 systolic ejection murmur best heard at LLSB) heard. Respiratory: Effort normal and breath sounds normal. She has no wheezes. She has no rales.  GI:       Patient has colostomy in left lower quadrant of her abdomen and ileostomy on the right site. She has a midline scar. Abdomen is soft with mild periumbilical tenderness. No organomegaly or masses noted.  Musculoskeletal:        Extremities are thin but no peripheral edema or clubbing noted.  Lymphadenopathy:    She has cervical adenopathy.  Neurological: She is alert.  Skin: Skin is warm and dry.     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 Interesting to note that her transaminases have repeatedly been normal. EUS confirmed multiple stones in common bile duct. Patient is undergoing therapeutic ERCP. Procedure and risks reviewed with the patient including risk of pancreatitis and bleeding and she is agreeable.  REHMAN,NAJEEB U 04/09/2011, 9:19 AM

## 2011-04-09 NOTE — Op Note (Signed)
NAMEIYSHA, MISHKIN               ACCOUNT NO.:  [id number]  MEDICAL RECORD NO.:  [id number]  LOCATION:  [facility]                          FACILITY:  [facility]  PHYSICIAN:  Lionel December, M.D.    DATE OF BIRTH:  Jul 18, 1940  DATE OF PROCEDURE:  04/09/2011 DATE OF DISCHARGE:                              OPERATIVE REPORT   PROCEDURE:  Endoscopic retrograde cholangiopancreatography with biliary sphincterotomy and stone extraction.  INDICATION:  The patient is a 70 year old African American female with history of amyloidosis and secretory diarrhea, who was recently evaluated because of recurrent pancreatitis.  She had EUS earlier this week revealing multiple stones in her bile duct. No abnormality noted to pancreas. She also has cholelithiasis.  She is undergoing therapeutic ERCP.  Procedure risks were reviewed with the patient.  Informed consent was obtained.  MEDICATIONS FOR SEDATION ANESTHESIA: Please see anesthesia records for details.  FINDINGS:  Procedure performed in the OR.  Once the patient was placed under anesthesia and intubated, she was turned into a semi-prone position.  Therapeutic Pentax video duodenoscope was passed through oropharynx without any difficulty into esophagus and stomach.  She has some food debris.  Antral and pyloric channel mucosa was normal.  Scope was advanced into second part of the duodenum.  Ampulla of Vater was identified.  A short intramural segment.  CBD was easily cannulated with Rx 44 Autotome and 035 hydra Jagwire.  Dilute contrast was injected in the biliary system.  CBD and CHD were mildly dilated, maximal diameter about 10 mm at the level of common hepatic duct, there were at least 2 filling defects.  Contrast also filled the gallbladder with a large single stone in the gallbladder.  Biliary sphincterotomy was performed. Stone balloon extractor was trolled through the duct multiple times and one large and multiple small pigment stones were removed.   The cholangiogram was repeated and there are no filling defects.  Endoscope was withdrawn.  The patient tolerated the procedure well and she is in the process of being extubated.  FINAL DIAGNOSES: 1. Choledocholithiasis. 2. Biliary sphincterotomy performed with removal of multiple pigment     stones. 3. PD was not cannulated or filled with contrast.  RECOMMENDATIONS: 1. No aspirin for 5 days. 2. Clear liquids today and she will go back on her usual diet in a.m.          ______________________________ Lionel December, M.D.     NR/MEDQ  D:  04/09/2011  T:  04/09/2011  Job:  [id number]  cc:   Lynett Fish, M.D. Fax: [phone]  Ernestina Penna, M.D. Fax: [phone]

## 2011-04-09 NOTE — OR Nursing (Signed)
Colostomy   To LLA,  WNL Ileostomy  To RLA,  WNL

## 2011-04-09 NOTE — Anesthesia Procedure Notes (Addendum)
Procedure Name: Intubation Date/Time: 04/09/2011 10:08 AM Performed by: Glynn Octave Pre-anesthesia Checklist: Patient identified, Patient being monitored, Timeout performed, Emergency Drugs available and Suction available Patient Re-evaluated:Patient Re-evaluated prior to inductionOxygen Delivery Method: Circle System Utilized Preoxygenation: Pre-oxygenation with 100% oxygen Intubation Type: IV induction, Rapid sequence and Circoid Pressure applied Laryngoscope Size: Mac and 3 Grade View: Grade II Tube type: Oral Tube size: 7.0 mm Number of attempts: 1 Airway Equipment and Method: stylet Placement Confirmation: ETT inserted through vocal cords under direct vision,  positive ETCO2 and breath sounds checked- equal and bilateral Secured at: 20 cm Tube secured with: Tape Dental Injury: Teeth and Oropharynx as per pre-operative assessment    Date/Time: 04/09/2011 10:12 AM Performed by: Glynn Octave Placement Confirmation: breath sounds checked- equal and bilateral and positive ETCO2 Comments: ETT placement verified after turning

## 2011-04-09 NOTE — Brief Op Note (Signed)
04/09/2011  10:42 AM  PATIENT:  [name]  70 y.o. female  PRE-OPERATIVE DIAGNOSIS:  cbd stone  POST-OPERATIVE DIAGNOSIS:  cbd stone  PROCEDURE:  Procedure(s): ENDOSCOPIC RETROGRADE CHOLANGIOPANCREATOGRAPHY (ERCP) SPHINCTEROTOMY  SURGEON:  Surgeon(s): Malissa Hippo, MD  ERCP note. Food debris in stomach but patent pylorus. Dilated CBD and CHD  With 2 filling defects. Large stone in GB. ES performed and multiple stone fragments removed. Patient tolerated procedure well. Job [id number]

## 2011-04-09 NOTE — Anesthesia Preprocedure Evaluation (Addendum)
Anesthesia Evaluation  Patient identified by MRN, date of birth, ID band Patient awake  General Assessment Comment  Reviewed: Allergy & Precautions, H&P , NPO status , Patient's Chart, lab work & pertinent test results, reviewed documented beta blocker date and time   Airway Mallampati: I TM Distance: >3 FB Neck ROM: Full    Dental  (+) Edentulous Upper and Partial Lower   Pulmonary (+) shortness of breath    Pulmonary exam normal       Cardiovascular hypertension, Pt. on medications and Pt. on home beta blockers + Valvular Problems/Murmurs Regular Normal    Neuro/Psych Negative Neurological ROS  Negative Psych ROS   GI/Hepatic Neg liver ROS  GERD Medicated  Endo/Other  Negative Endocrine ROS  Renal/GU Renal diseasenegative Renal ROS     Musculoskeletal negative musculoskeletal ROS (+)   Abdominal (+) scaphoid   Abdomen: soft.    Peds  Hematology  (+) Blood dyscrasia, anemia ,   Anesthesia Other Findings   Reproductive/Obstetrics                          Anesthesia Physical Anesthesia Plan  ASA: III  Anesthesia Plan: General   Post-op Pain Management:    Induction: Intravenous, Rapid sequence and Cricoid pressure planned  Airway Management Planned: Oral ETT  Additional Equipment:   Intra-op Plan:   Post-operative Plan: Extubation in OR  Informed Consent: I have reviewed the patients History and Physical, chart, labs and discussed the procedure including the risks, benefits and alternatives for the proposed anesthesia with the patient or authorized representative who has indicated his/her understanding and acceptance.     Plan Discussed with: CRNA  Anesthesia Plan Comments:         Anesthesia Quick Evaluation

## 2011-04-09 NOTE — Transfer of Care (Signed)
Immediate Anesthesia Transfer of Care Note  Patient: [name]  Procedure(s) Performed:  ENDOSCOPIC RETROGRADE CHOLANGIOPANCREATOGRAPHY (ERCP) - 10:25; SPHINCTEROTOMY - with removal of multiple pigment stones  Patient Location: PACU  Anesthesia Type: General  Level of Consciousness: sedated  Airway & Oxygen Therapy: Patient Spontanous Breathing and Patient connected to face mask oxygen  Post-op Assessment: Report given to PACU RN  Post vital signs: Reviewed and stable  Complications: No apparent anesthesia complications

## 2011-04-13 NOTE — H&P (Signed)
[name], [name]:  [id number]  MEDICAL RECORD NO.:  [id number]  LOCATION:  1513                         FACILITY:  [facility]  PHYSICIAN:  Vania Rea, M.D. DATE OF BIRTH:  July 28, 1940  DATE OF ADMISSION:  04/01/2011 DATE OF DISCHARGE:  04/04/2011                             HISTORY & PHYSICAL   PRIMARY CARE PHYSICIAN:  Ernestina Penna, MD  GASTROENTEROLOGIST:  Lionel December, MD, at [facility].  ONCOLOGIST:  Lynett Fish, MD  CHIEF COMPLAINT:  Diarrhea for the past 2 weeks, nausea and vomiting for the past 3 days.  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  She is now status post colostomy and ileostomy and has had several admissions related to acute renal failure from dehydration due to excess ostomy output.  The patient was last admitted in early September.  The patient reports that since discharge she has had her octreotide increased.  She has also been started on Creon as there is a suspicion that she is having a pancreatic problem and has been fairly stable until 2 weeks ago when she noted everything she eats or drinks immediately comes right out her ileostomy and colostomy.  She is having to change the bags about every 30 minutes, thus she is producing a profuse watery diarrhea, no blood.  She has solved the problem by not eating for the past 3 days and not drinking anything since yesterday.  She, however, has been having nausea and vomiting for the past 3 days.  No blood in the vomitus.  She has visited [facility] 3 times in the past week to get IV fluid hydration, but when she returns home her problems persist.  Also complained of diffuse body aches and back pain radiating down into her right leg.  Denies  any severe colicky abdominal pain.  PAST MEDICAL HISTORY: 1. Amyloid cardiomyopathy. 2. Hypertension. 3. Hyperlipidemia. 4. Status post remote chemotherapy for intestinal amyloid, status post     bowel resection for complications of chemotherapy. 5. Recurrent episodes of acute renal failure related to dehydration. 6. Past history of C. diff.  MEDICATIONS: 1. Creon 1 capsule 3 times daily between meals. 2. Megace 400 mg twice daily. 3. Ambien 10 mg at bedtime as needed. 4. Vitamin D3 1 tablet daily. 5. Simethicone 125 mg daily as needed. 6. Protonix 40 mg twice daily. 7. Zofran 8 mg 3 times daily. 8. Octreotide 100 mcg subcu 3 times daily. 9. Multivitamins 1 daily. 10.Toprol-XL 25 mg daily. 11.Lyrica 50 mg daily. 12.Lisinopril 5 mg daily. 13.Imodium as needed. 14.Hydrocodone/acetaminophen 5/325 one tablet every 4 hours. 15.Folic acid 1 mg daily. 16.Atorvastatin 40 mg daily. 17.Aspirin 81 mg daily. 18.Amitriptyline 25 mg at bedtime.  ALLERGIES:  No known drug allergies.  She is lactose intolerant.  SOCIAL HISTORY:  Lives alone, has [facility] coming in to help her.  She uses a cane and a walker for ambulation.  Denies tobacco, alcohol, or illicit drug use.  FAMILY HISTORY:  Significant for  diabetes and coronary artery disease.  REVIEW OF SYSTEMS:  Other than noted above unremarkable.  PHYSICAL EXAM:  GENERAL:  Emaciated, but pleasant elderly African American lady, lying in the stretcher. VITAL SIGNS:  Temperature is 98.3, pulse 105, respirations 20, blood pressure 126/76.  She is saturating 98% on 2 L. HEENT:  Her pupils are round and equal.  Mucous membranes are pink, dry, and anicteric.  No cervical lymphadenopathy or thyromegaly.  No carotid bruit. CHEST:  Clear to auscultation bilaterally.  She has a loop recorder implanted on the left upper chest. CARDIOVASCULAR:  Regular rhythm.  She has a 2/6 systolic murmur. ABDOMEN:  Scaphoid and soft.  She has a  broad midline abdominal scar. She has colostomy and ileostomy bags at both sites of the abdomen.  The bags are completely flat and empty.  She has had no output since the hour she has been in the emergency room. EXTREMITIES:  She has no edema. MUSCULOSKELETAL:  She has generalized wasting. CENTRAL NERVOUS SYSTEM:  Cranial nerves II-XII grossly intact.  She has no focal lateralizing signs.  LABS:  Her white count is 5.1, hemoglobin 13.5, and platelets 195.  She has a normal differential.  Her sodium is 133, potassium 5.2, chloride 106, CO2 of 18, glucose 101, BUN 42, creatinine 2.13.  Alk phos is 129, the rest of her liver functions are unremarkable.  Her calcium is 9.0. Total protein 6.8, albumin 4.0.  Urinalysis shows trace leukocyte esterase, 30 proteins, negative for ketones, otherwise unremarkable. Lipase is elevated at 172, upper limits of normal being 59.  Her lactic acid is normal at 1.1.  ASSESSMENT: 1. Acute renal failure, related to dehydration. 2. Nausea, vomiting, and diarrhea, possibly related to pancreatitis,     possible aggravation of her secretory diarrhea. 3. Hypertension, controlled. 4. Hyperlipidemia.5. Amyloid cardiomyopathy.  PLAN:  We will admit this lady for hydration.  We will keep her n.p.o. If she has any stool, we will check for C. diff, but will give her the benefit of a consult with her Dr. Rob Bunting with whom she says she is scheduled to have ERCP in the coming days.  Other plans as per orders.     Vania Rea, M.D.     LC/MEDQ  D:  04/01/2011  T:  04/12/2011  Job:  [id number]  Electronically Signed by Vania Rea M.D. on 04/13/2011 12:23:52 AM

## 2011-04-14 ENCOUNTER — Encounter (HOSPITAL_COMMUNITY): Payer: Self-pay | Admitting: Internal Medicine

## 2011-04-20 ENCOUNTER — Inpatient Hospital Stay (HOSPITAL_COMMUNITY): Admission: RE | Admit: 2011-04-20 | Payer: Medicare Other | Source: Ambulatory Visit

## 2011-04-22 ENCOUNTER — Encounter: Payer: Medicare Other | Admitting: Gastroenterology

## 2011-04-22 ENCOUNTER — Encounter (INDEPENDENT_AMBULATORY_CARE_PROVIDER_SITE_OTHER): Payer: Self-pay | Admitting: *Deleted

## 2011-05-03 ENCOUNTER — Ambulatory Visit (INDEPENDENT_AMBULATORY_CARE_PROVIDER_SITE_OTHER): Payer: Medicare Other | Admitting: *Deleted

## 2011-05-03 ENCOUNTER — Ambulatory Visit (INDEPENDENT_AMBULATORY_CARE_PROVIDER_SITE_OTHER): Payer: Medicare Other | Admitting: Cardiology

## 2011-05-03 ENCOUNTER — Encounter: Payer: Self-pay | Admitting: Cardiology

## 2011-05-03 ENCOUNTER — Encounter: Payer: Self-pay | Admitting: Internal Medicine

## 2011-05-03 DIAGNOSIS — I6529 Occlusion and stenosis of unspecified carotid artery: Secondary | ICD-10-CM

## 2011-05-03 DIAGNOSIS — R55 Syncope and collapse: Secondary | ICD-10-CM

## 2011-05-03 DIAGNOSIS — R0602 Shortness of breath: Secondary | ICD-10-CM

## 2011-05-03 DIAGNOSIS — I509 Heart failure, unspecified: Secondary | ICD-10-CM

## 2011-05-03 DIAGNOSIS — R42 Dizziness and giddiness: Secondary | ICD-10-CM

## 2011-05-03 DIAGNOSIS — E7800 Pure hypercholesterolemia, unspecified: Secondary | ICD-10-CM

## 2011-05-03 DIAGNOSIS — E639 Nutritional deficiency, unspecified: Secondary | ICD-10-CM

## 2011-05-03 DIAGNOSIS — E785 Hyperlipidemia, unspecified: Secondary | ICD-10-CM

## 2011-05-03 DIAGNOSIS — I5032 Chronic diastolic (congestive) heart failure: Secondary | ICD-10-CM

## 2011-05-03 LAB — HEPATIC FUNCTION PANEL
ALT: 53 U/L — ABNORMAL HIGH (ref 0–35)
AST: 49 U/L — ABNORMAL HIGH (ref 0–37)
Albumin: 4.1 g/dL (ref 3.5–5.2)
Total Protein: 7.1 g/dL (ref 6.0–8.3)

## 2011-05-03 LAB — BASIC METABOLIC PANEL
Calcium: 9.1 mg/dL (ref 8.4–10.5)
GFR: 61.89 mL/min (ref >60.00)
Potassium: 4.6 mEq/L (ref 3.5–5.1)
Sodium: 139 mEq/L (ref 135–145)

## 2011-05-03 LAB — LIPID PANEL
Cholesterol: 145 mg/dL (ref 0–200)
HDL: 54 mg/dL (ref >39.00)
Triglycerides: 183 mg/dL — ABNORMAL HIGH (ref 0.0–149.0)
VLDL: 36.6 mg/dL (ref 0.0–40.0)

## 2011-05-03 LAB — PACEMAKER DEVICE OBSERVATION

## 2011-05-03 LAB — BRAIN NATRIURETIC PEPTIDE: Pro B Natriuretic peptide (BNP): 19 pg/mL (ref 0.0–100.0)

## 2011-05-03 MED ORDER — METOPROLOL SUCCINATE ER 50 MG PO TB24: 50.0000 mg | ORAL_TABLET | Freq: Every day | ORAL | Status: DC

## 2011-05-03 NOTE — Patient Instructions (Addendum)
Increase Toprol XL (metoprolol succinate)  to 50mg  daily.   Your physician recommends that you return for a FASTING lipid profile /liver profile/BMET/BNP 428.32 272.0  Your physician has requested that you have a carotid duplex. This test is an ultrasound of the carotid arteries in your neck. It looks at blood flow through these arteries that supply the brain with blood. Allow one hour for this exam. There are no restrictions or special instructions. November 26,2012 at 1PM.  Your physician recommends that you schedule a follow-up appointment in: 6 months with Dr Shirlee Latch. (May 2013).

## 2011-05-03 NOTE — Progress Notes (Signed)
LOOP RECORDER CHECK

## 2011-05-04 NOTE — Assessment & Plan Note (Signed)
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  Repeat carotid dopplers this month.

## 2011-05-04 NOTE — Progress Notes (Signed)
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  She underwent treatment with Velcade and dexamethasone for systemic amyloidosis.  Monoclonal light chain count fell with treatment, but she developed intractable diarrhea with bowel perforations in 1/11 and 7/11.  She has had a partial colectomy.  Last echo in 5/12 showed mild LVH, vigorous LV systolic function, mild pulmonary hypertension (LV mid cavity gradient was not reported.  Since I saw her last, she has continued to have abdominal problems.  She had recurrent pancreatitis and had ERCP with sphincterotomy for choledocholithiasis in 10/12.  She may need her gallbladder out in the future.  She has stable dyspnea with moderate exertion.  No dyspnea walking around her house.  She continues to have chronic diarrhea and is on octreotide.  No chest pain, no syncope for several months now.  Loop recorder was interrogated today and showed no significant events.   Labs (7/10): LDL 87, HDL 52, ALT 46, AST 33 Labs (8/10): K 4.4, creatinine 1.0, BNP 119, HCT 30.6, SPEP negative, UPEP positive for monoclonal lambda light chains.  Labs (9/10): creatinine 0.93, C diff negative  Labs (4/11): HDL 58, LDL 127, K 3.5, creatinine 0.8 Labs (3/12): K 4.9, creatinine 1.04, LDL 69, HDL 45 Labs (10/12): K 4.4, creatinine 1.23  Allergies (verified):  No Known Drug Allergies  Past Medical History: 1.  Left CEA (10/08).  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  2.  HTN 3.  GERD 4.  Hyperlipidemia 5.  Anemia:  She has been on Aranesp for ? anemia of chronic kidney disease though renal function does not seem severely impaired.  She had repeat bone marrow biopsy by Dr. Cleone Slim 9/10. 6.  s/p hysterectomy 7.  Mid-cavity dynamic LV gradient:  Echo (4/11) with EF 65-70%, severe concentric LVH, peak LV mid-cavity gradient reaching 45  mmHg with valsalva, grade I diastolic dysfunction, RV hypertrophy, small pericardial effusion.  Echo (5/12) with mild LVH, EF 65-70%, PASP 40 mmHg.  8.  Adenosine myoview (5/10):  No evidence for ischemia or infarction.  9.  Episodes of syncope and presyncope.  - Patient has [employer] loop recorder implanted 8/10.  No events so far - May be due only to orthostasis 10. 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   - Cardiac MRI (8/10): EF 66%, mild to moderate concentric LVH, RV size and systolic function normal with no evidence for ARVC, trivial pericardial effusion.  On delayed enhancement, it was difficult to obtain the TI.  There was patchy mid-wall basal delayed enhancement.  There was partially circumferential subendocardial delayed enhancement in the mid to apical LV.  This was suggestive of infiltrative cardiomyopathy versus possible HCM.  - SPEP negative.  UPEP positive for monoclonal lambda light chains.  Monoclonal light chains confirmed on testing by Dr. Cleone Slim.  - CT chest (8/10) to assess for sarcoidosis: no evidence by CT for pulmonary sarcoid.  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  11.  C. difficile colitis with orthostatic hypotension 12. Diverticulitis (1/11) with perforation and colectomy/colostomy.  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  14. Esophageal stricture with dilation 2023-06-06  Family History: Mother died suddenly with presumed MI at 53.  Father died suddenly with presumed MI at age 54.  Cousin died suddenly, ?  age.   Social History: Quit tobacco in 48.  No ETOH or drugs.  Single, lives in [place], 1 child (daughter in [place]).  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   Review of Systems        All systems reviewed and negative except as per HPI.   Current Outpatient Prescriptions  Medication Sig  Dispense Refill  . amitriptyline (ELAVIL) 25 MG tablet Take 25 mg by mouth at bedtime.        Marland Kitchen aspirin EC 81 MG tablet Take 1 tablet (81 mg total) by mouth daily.      Marland Kitchen atorvastatin (LIPITOR) 40 MG tablet TAKE ONE TABLET BY MOUTH ONE TIME DAILY  30 tablet  2  . Cholecalciferol (VITAMIN D) 1000 UNITS capsule Take 1,000 Units by mouth daily.       . folic acid (FOLVITE) 1 MG tablet Take 1 mg by mouth daily.        Marland Kitchen HYDROcodone-acetaminophen (VICODIN) 5-500 MG per tablet Take 1 tablet by mouth every 4 (four) hours as needed. For pain       . lipase/protease/amylase (CREON-10/PANCREASE) 12000 UNITS CPEP Take 1 capsule by mouth 3 (three) times daily.        Marland Kitchen lisinopril (PRINIVIL,ZESTRIL) 5 MG tablet Take 5 mg by mouth daily.       Marland Kitchen loperamide (IMODIUM) 2 MG capsule Take 2 mg by mouth 4 (four) times daily as needed. For diarrhea       . LYRICA 50 MG capsule Take 50 mg by mouth daily as needed. For nerve pain      . megestrol (MEGACE) 40 MG/ML suspension Take 200 mg by mouth 2 (two) times daily before a meal.       . Multiple Vitamin (MULTIVITAMIN) tablet Take 1 tablet by mouth daily.        . Multiple Vitamins-Minerals (MULTIVITAMINS THER. W/MINERALS) TABS Take 1 tablet by mouth daily.        Marland Kitchen octreotide (SANDOSTATIN) 100 MCG/ML SOLN Inject 100 mcg into the skin 3 (three) times daily.        . ondansetron (ZOFRAN) 8 MG tablet Take 8 mg by mouth every 8 (eight) hours as needed. For nausea      . pantoprazole (PROTONIX) 40 MG tablet Take 40 mg by mouth 2 (two) times daily.        Marland Kitchen zolpidem (AMBIEN) 10 MG tablet Take 10 mg by mouth at bedtime as needed. For sleep      . metoprolol (TOPROL XL) 50 MG 24 hr tablet Take 1 tablet (50 mg total) by mouth daily.  30 tablet  6  . octreotide (SANDOSTATIN) 1000 MCG/ML SOLN        Current Facility-Administered Medications  Medication Dose Route Frequency Provider Last Rate Last Dose  . ceFAZolin (ANCEF) injection 1 g  1 g Intramuscular 120 min pre-op Malissa Hippo, MD        BP 118/76  Pulse 72  Ht 5\' 3"  (1.6 m)  Wt 46.267 kg (102 lb)  BMI 18.07 kg/m2 General:  Thin, no apparent distress Neck:  Neck supple, no JVD. No masses, thyromegaly or abnormal cervical nodes. Lungs:  Clear bilaterally to auscultation and percussion. Heart:  Non-displaced PMI, chest non-tender; regular rate and rhythm, S1, S2, 2/6 systolic crescendo-decrescendo mid-peaking systolic murmur along sternal border. No S3/S4.  Bilateral carotid bruits. Pedals normal pulses. No edema.  Abdomen:  Bowel sounds positive; abdomen soft and non-tender without masses, organomegaly, or hernias noted. No  hepatosplenomegaly.  Extremities:  No clubbing or cyanosis. Neurologic:  Alert and oriented x 3. Psych:  Normal affect.

## 2011-05-04 NOTE — Assessment & Plan Note (Signed)
Minimal symptoms now.  No recent syncope.  Still has loop recorder in, no events when interrogated today.

## 2011-05-04 NOTE — Assessment & Plan Note (Signed)
Patient has a cardiomyopathy with preserved LV systolic function.  Delayed enhancement on MRI suggested an infiltrative disease or hypertrophic cardiomyopathy rather than coronary disease, and she has had a negative myoview.  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  This was confirmed by testing at Dr. Bertha Stakes office as well.  CT chest was not suggestive of pulmonary sarcoidosis. Given the finding of monoclonal light chains and suggestive MRI findings, she was treated for amyloidosis by Dr. Cleone Slim with Velcade and dexamethasone.  Unfortunately, she developed severe diarrhea and had 2 bowel perforations. Therefore, she is off treatment now.    - She does not appear significantly volume overloaded today.  No Lasix as she does not appear overloaded and this could worsen her mid-cavity gradient and orthostasis.  - Increase Toprol XL to 50 mg daily as this may help decrease LV mid-cavity gradient.   - Check BNP today.

## 2011-05-04 NOTE — Assessment & Plan Note (Signed)
Check lipids/LFTs, goal LDL < 70 with known vascular disease (carotid stenosis).

## 2011-05-05 ENCOUNTER — Other Ambulatory Visit: Payer: Self-pay | Admitting: Neurosurgery

## 2011-05-05 DIAGNOSIS — M47816 Spondylosis without myelopathy or radiculopathy, lumbar region: Secondary | ICD-10-CM

## 2011-05-07 ENCOUNTER — Ambulatory Visit
Admission: RE | Admit: 2011-05-07 | Discharge: 2011-05-07 | Disposition: A | Discharge status: Home / Self Care | Payer: Medicare Other | Source: Ambulatory Visit | Attending: Neurosurgery | Admitting: Neurosurgery

## 2011-05-07 ENCOUNTER — Telehealth: Payer: Self-pay | Admitting: Cardiology

## 2011-05-07 DIAGNOSIS — E785 Hyperlipidemia, unspecified: Secondary | ICD-10-CM

## 2011-05-07 DIAGNOSIS — M47816 Spondylosis without myelopathy or radiculopathy, lumbar region: Secondary | ICD-10-CM

## 2011-05-07 MED ORDER — METHYLPREDNISOLONE ACETATE 40 MG/ML INJ SUSP (RADIOLOG
120.0000 mg | Freq: Once | INTRAMUSCULAR | Status: AC
  Administered 2011-05-07: 120 mg via EPIDURAL

## 2011-05-07 MED ORDER — IOHEXOL 180 MG/ML  SOLN
1.0000 mL | Freq: Once | INTRAMUSCULAR | Status: AC | PRN
  Administered 2011-05-07: 1 mL via EPIDURAL

## 2011-05-07 NOTE — Telephone Encounter (Signed)
Patient called was given lab results.Normal BNP,good LDL,mild elevation LFT'S repeat LFT'S in 1 month.

## 2011-05-07 NOTE — Telephone Encounter (Signed)
F/U from previous call:  Returning call back to [facility].

## 2011-05-10 ENCOUNTER — Encounter (INDEPENDENT_AMBULATORY_CARE_PROVIDER_SITE_OTHER): Payer: Medicare Other | Admitting: *Deleted

## 2011-05-10 DIAGNOSIS — I6529 Occlusion and stenosis of unspecified carotid artery: Secondary | ICD-10-CM

## 2011-05-10 DIAGNOSIS — E7800 Pure hypercholesterolemia, unspecified: Secondary | ICD-10-CM

## 2011-05-10 DIAGNOSIS — I5032 Chronic diastolic (congestive) heart failure: Secondary | ICD-10-CM

## 2011-05-11 ENCOUNTER — Other Ambulatory Visit (INDEPENDENT_AMBULATORY_CARE_PROVIDER_SITE_OTHER): Payer: Self-pay | Admitting: Internal Medicine

## 2011-05-24 ENCOUNTER — Encounter (INDEPENDENT_AMBULATORY_CARE_PROVIDER_SITE_OTHER): Payer: Self-pay | Admitting: Internal Medicine

## 2011-05-24 ENCOUNTER — Ambulatory Visit (INDEPENDENT_AMBULATORY_CARE_PROVIDER_SITE_OTHER): Payer: Medicare Other | Admitting: Internal Medicine

## 2011-05-24 VITALS — BP 120/80 | HR 82 | Temp 97.0°F | Resp 14 | Ht 63.0 in | Wt 107.0 lb

## 2011-05-24 DIAGNOSIS — R197 Diarrhea, unspecified: Secondary | ICD-10-CM

## 2011-05-24 DIAGNOSIS — R945 Abnormal results of liver function studies: Secondary | ICD-10-CM

## 2011-05-24 DIAGNOSIS — R7989 Other specified abnormal findings of blood chemistry: Secondary | ICD-10-CM

## 2011-05-24 DIAGNOSIS — R1031 Right lower quadrant pain: Secondary | ICD-10-CM

## 2011-05-24 MED ORDER — METRONIDAZOLE 250 MG PO TABS: 250.0000 mg | ORAL_TABLET | Freq: Three times a day (TID) | ORAL | Status: AC

## 2011-05-24 NOTE — Patient Instructions (Addendum)
Consider stopping metoclopromide if alright with Dr. Margo Common. Call us with a progress report later this week. Notify if you have any side effects with metronidazole. Please do stool output estimation for 24 hours at a time when your home and able to do so

## 2011-05-30 NOTE — Progress Notes (Signed)
Presenting complaint; Abdominal pain. Subjective: [name] is 70 year old Afro-American female [place] ERCP on 04/09/2011 for choledocholithiasis. Multiple pigment stones are removed following sphincterotomy. She states her appetite is improved and she does not experience nausea anymore but she continues to complain of pain across the mid and lower abdomen more on the right side described as gas pain but she says it does not last long. Pain occurs every day and she burps a lot. She has not noted any change in ileostomy output and she has no output via mucous fistula. She wants to know if she will able to have her ileostomy taken down.  Is maintaining her weight. Current Medications: Current Outpatient Prescriptions on File Prior to Visit  Medication Sig Dispense Refill  . amitriptyline (ELAVIL) 25 MG tablet Take 25 mg by mouth at bedtime.        Marland Kitchen aspirin EC 81 MG tablet Take 1 tablet (81 mg total) by mouth daily.      Marland Kitchen atorvastatin (LIPITOR) 40 MG tablet TAKE ONE TABLET BY MOUTH ONE TIME DAILY  30 tablet  2  . Cholecalciferol (VITAMIN D) 1000 UNITS capsule Take 1,000 Units by mouth daily.       . folic acid (FOLVITE) 1 MG tablet Take 1 mg by mouth daily.        Marland Kitchen HYDROcodone-acetaminophen (VICODIN) 5-500 MG per tablet Take 1 tablet by mouth every 4 (four) hours as needed. For pain       . lipase/protease/amylase (CREON-10/PANCREASE) 12000 UNITS CPEP Take 1 capsule by mouth 3 (three) times daily.        Marland Kitchen lisinopril (PRINIVIL,ZESTRIL) 5 MG tablet Take 5 mg by mouth daily.       Marland Kitchen loperamide (IMODIUM) 2 MG capsule Take 2 mg by mouth 4 (four) times daily as needed. For diarrhea       . LYRICA 50 MG capsule Take 50 mg by mouth daily as needed. For nerve pain      . megestrol (MEGACE) 40 MG/ML suspension Take 200 mg by mouth 2 (two) times daily before a meal.       . metoprolol (TOPROL XL) 50 MG 24 hr tablet Take 1 tablet (50 mg total) by mouth daily.  30 tablet  6  . Multiple Vitamin (MULTIVITAMIN)  tablet Take 1 tablet by mouth daily.        Marland Kitchen octreotide (SANDOSTATIN) 100 MCG/ML SOLN Inject 150 mcg into the skin every 12 (twelve) hours.       . ondansetron (ZOFRAN) 8 MG tablet Take 8 mg by mouth every 8 (eight) hours as needed. For nausea      . pantoprazole (PROTONIX) 40 MG tablet TAKE 1 TABLET BY MOUTH TWICE DAILY 30 MINUTES BEFORE BREAKFAST AND BEFORE EVENING MEAL  60 tablet  5  . zolpidem (AMBIEN) 10 MG tablet Take 10 mg by mouth at bedtime as needed. For sleep        Objective: BP 120/80  Pulse 82  Temp(Src) 97 F (36.1 C) (Oral)  Resp 14  Ht 5\' 3"  (1.6 m)  Wt 107 lb (48.535 kg)  BMI 18.95 kg/m2  Conjunctiva is pink. Sclera is nonicteric Oral pharyngeal mucosa is normal. No neck masses or thyromegaly noted. Cardiac exam with regular rhythm normal S1 and S2. No murmur or gallop noted. Lungs are clear to auscultation. Abdomen is is flat with ileostomy in right lower quadrant and mucous fistula and left lower quadrant. Bowel sounds are normal. She has mild tenderness in right lower  quadrant but no organomegaly or masses noted. No LE edema or clubbing noted.  Labs/studies Results:  lab data from 05/21/2011 BBC 6.6, hemoglobin 10.6, hematocrit 31.9, platelet count 146K  Assessment: #1. Epigastric pain has resolved since removal of stones from her bile duct. #2. Mid and lower abdominal pain centered in right lower quadrant; and concern is adhesions with partial small bowel obstruction her abdominal exam today is relatively benign. Will try her on metronidazole and if it doesn't help may need CT enterography. #3. 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    Plan: Empiric therapy with metronidazole 250 mg 3 times a day for 10 days. Patient will call with progress report and when she finishes antibiotic therapy. Discontinue metoclopramide if alright with Dr. Margo Common. Stool output for 24 hours  each time; do it  couple of times in the month. Office visit in 3 months.     Marland Kitchen

## 2011-06-02 ENCOUNTER — Other Ambulatory Visit: Payer: Self-pay | Admitting: Cardiology

## 2011-06-02 NOTE — Telephone Encounter (Signed)
Fax Received. Refill Completed. [name] (R.M.A)   

## 2011-06-03 ENCOUNTER — Telehealth (INDEPENDENT_AMBULATORY_CARE_PROVIDER_SITE_OTHER): Payer: Self-pay | Admitting: *Deleted

## 2011-06-03 ENCOUNTER — Ambulatory Visit (INDEPENDENT_AMBULATORY_CARE_PROVIDER_SITE_OTHER): Payer: Medicare Other | Admitting: *Deleted

## 2011-06-03 DIAGNOSIS — E785 Hyperlipidemia, unspecified: Secondary | ICD-10-CM

## 2011-06-03 LAB — HEPATIC FUNCTION PANEL
Alkaline Phosphatase: 126 U/L — ABNORMAL HIGH (ref 39–117)
Bilirubin, Direct: 0.1 mg/dL (ref 0.0–0.3)

## 2011-06-03 NOTE — Telephone Encounter (Signed)
Ms. Broski left a message for Dr. Karilyn Cota. Her Colostomy bag output is 1/2 - 1 gallon a day. This may vary from 30 minutes or longer. She ask to be called back at [phone].

## 2011-06-05 NOTE — Telephone Encounter (Signed)
Patient,s call returned. A device that she take Imodium 4 mg before breakfast and 2 mg for lunch. She needs to avoid or decrease intake of foods and fluid which trigger diarrhea or high output. She should eat multiple small meals and also try cheese. I also reviewed her LFTs from 2 days ago. AST and ALT are down and her bilirubin is normal. Her eye doctor thought she was jaundiced. Patient advised to call if her weight drops below 100 lbs.

## 2011-06-07 ENCOUNTER — Telehealth: Payer: Self-pay | Admitting: Cardiology

## 2011-06-07 DIAGNOSIS — R748 Abnormal levels of other serum enzymes: Secondary | ICD-10-CM

## 2011-06-07 DIAGNOSIS — I5032 Chronic diastolic (congestive) heart failure: Secondary | ICD-10-CM

## 2011-06-07 NOTE — Telephone Encounter (Signed)
Notes Recorded by Jacqlyn Krauss, RN on 06/07/2011 at 9:46 AM Notified of results and recommendations. She will return for liver profile 07/21/11. Notes Recorded by Jacqlyn Krauss, RN on 06/07/2011 at 9:27 AM LMTCB Notes Recorded by Marca Ancona, MD on 06/04/2011 at 11:20 AM LFTs elevated but lower. Would repeat in 2 months.

## 2011-07-16 ENCOUNTER — Telehealth (INDEPENDENT_AMBULATORY_CARE_PROVIDER_SITE_OTHER): Payer: Self-pay | Admitting: *Deleted

## 2011-07-16 NOTE — Telephone Encounter (Signed)
This is for Dr. Rehman 

## 2011-07-16 NOTE — Telephone Encounter (Signed)
LM stating this was very important. Her insurance company will not approve the Octreotide injection. Would like for Terri to give her a call back at [phone].

## 2011-07-17 NOTE — Telephone Encounter (Signed)
This note should have been directed to Ms. Todd. I called patient and found out that Dr. Bertha Stakes  office called insurance and got her octreotide approved.

## 2011-07-19 NOTE — Telephone Encounter (Signed)
Dr. Bertha Stakes office call and LM stating they didn't prescribe the medication and could not do the authorization. There are questions they didn't know the answer to. Their return phone number is [phone] ext.[phone].  [name] Social Worker from [employer] stating the doctor would need to do an appeal to see if he could get the Octreotide injection approved. [name] would like for the nurse or doctor to return her call at [phone] ext.[phone].

## 2011-07-20 ENCOUNTER — Encounter: Payer: Self-pay | Admitting: Vascular Surgery

## 2011-07-21 ENCOUNTER — Ambulatory Visit (INDEPENDENT_AMBULATORY_CARE_PROVIDER_SITE_OTHER): Payer: Medicare Other | Admitting: Vascular Surgery

## 2011-07-21 ENCOUNTER — Other Ambulatory Visit (INDEPENDENT_AMBULATORY_CARE_PROVIDER_SITE_OTHER): Payer: Medicare Other | Admitting: *Deleted

## 2011-07-21 ENCOUNTER — Encounter: Payer: Self-pay | Admitting: Vascular Surgery

## 2011-07-21 ENCOUNTER — Other Ambulatory Visit (INDEPENDENT_AMBULATORY_CARE_PROVIDER_SITE_OTHER): Payer: Medicare Other | Admitting: Vascular Surgery

## 2011-07-21 VITALS — BP 132/84 | HR 95 | Resp 16 | Ht 63.0 in | Wt 113.0 lb

## 2011-07-21 DIAGNOSIS — I509 Heart failure, unspecified: Secondary | ICD-10-CM

## 2011-07-21 DIAGNOSIS — I6529 Occlusion and stenosis of unspecified carotid artery: Secondary | ICD-10-CM

## 2011-07-21 DIAGNOSIS — I5032 Chronic diastolic (congestive) heart failure: Secondary | ICD-10-CM

## 2011-07-21 DIAGNOSIS — Z48812 Encounter for surgical aftercare following surgery on the circulatory system: Secondary | ICD-10-CM

## 2011-07-21 DIAGNOSIS — R748 Abnormal levels of other serum enzymes: Secondary | ICD-10-CM

## 2011-07-21 NOTE — Progress Notes (Unsigned)
Carotid performed @ [facility] 07/21/2011

## 2011-07-21 NOTE — Progress Notes (Signed)
[facility]  Patient name: [name] MRN: [id number] DOB: 11-05-1940 Sex: female  REASON FOR VISIT: follow up of carotid disease  HPI: [name] is a 71 y.o. female who underwent a left carotid endarterectomy in 2008. I been following her with a 60-79% right carotid stenosis. Since I saw her last, she's had no history of stroke, TIAs, expressive or receptive aphasia, or amaurosis fugax.  Past Medical History  Diagnosis Date  . History of CEA (carotid endarterectomy) 03/2007    Left CEA by Dr. Cari Caraway  . Hypertension   . Hyperlipidemia   . GERD (gastroesophageal reflux disease)   . 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 anemia of chronic kidney disease though renal function does not seem severely impaired.  She had repeat bone marrow biopsy by Dr. Cleone Slim 9/10.  Marland Kitchen Episode of syncope     dysauthonomia/ orthostatic syncope - Patient has Medtronic loop recorder implanted 8/10.  No events so far - May be due only to orthostasis  . 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    . History of MRI 01/2009    CardiacMRI/EF 66%, mild to moderate concentric LVH, RV size & systolic function normAL w/o evidence for ARVC, trivial pericardial effusion/On delayed enhancement, it was difficult to obtain the TI/There was patchy mid-wall basal delayed enhancement/There was partially circumferential subendocardialdelayed enhancement in the mid to apical LV/This was suggestive of infiltrative cardiomyopathy versus  . History of CT scan of chest 01/2009    to assess for sarcoidosis: no evidence by CT for pulmonary sarcoid  . 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   . Secretory diarrhea      treated with octreotide.   . Esophageal stricture 05/2010    with dilation  . Amyloidosis   . Shortness  of breath     when tired  . Cancer 2010    Family History  Problem Relation Age of Onset  . Heart failure Mother   . Hypertension Mother   . Hyperlipidemia Mother   . Heart attack Mother   . Heart failure Father   . Anesthesia problems Neg Hx   . Hypotension Neg Hx   . Malignant hyperthermia Neg Hx   . Pseudochol deficiency Neg Hx     SOCIAL HISTORY: History  Substance Use Topics  . Smoking status: Former Smoker -- 1.0 packs/day for 5 years    Types: Cigarettes    Quit date: 09/07/1978  . Smokeless tobacco: Never Used  . Alcohol Use: No    No Known Allergies  Current Outpatient Prescriptions  Medication Sig Dispense Refill  . amitriptyline (ELAVIL) 25 MG tablet Take 50 mg by mouth at bedtime.       Marland Kitchen aspirin EC 81 MG tablet Take 1 tablet (81 mg total) by mouth daily.      Marland Kitchen atorvastatin (LIPITOR) 40 MG tablet TAKE ONE TABLET BY MOUTH ONE TIME DAILY  30 tablet  5  . Cholecalciferol (VITAMIN D) 1000 UNITS capsule Take 1,000 Units by mouth daily.       . folic acid (FOLVITE) 1 MG tablet Take 1 mg by mouth daily.        Marland Kitchen HYDROcodone-acetaminophen (VICODIN) 5-500 MG per tablet Take 1 tablet by mouth every 4 (four) hours as needed. For  pain       . lipase/protease/amylase (CREON-10/PANCREASE) 12000 UNITS CPEP Take 1 capsule by mouth 3 (three) times daily.        Marland Kitchen lisinopril (PRINIVIL,ZESTRIL) 5 MG tablet Take 5 mg by mouth daily.       Marland Kitchen loperamide (IMODIUM) 2 MG capsule Take 2 mg by mouth 4 (four) times daily as needed. For diarrhea       . LYRICA 50 MG capsule Take 50 mg by mouth daily as needed. For nerve pain      . megestrol (MEGACE) 40 MG/ML suspension Take 200 mg by mouth 2 (two) times daily before a meal.       . metoCLOPramide (REGLAN) 10 MG tablet Take 10 mg by mouth 2 (two) times daily.        . metoprolol (TOPROL XL) 50 MG 24 hr tablet Take 1 tablet (50 mg total) by mouth daily.  30 tablet  6  . Multiple Vitamin (MULTIVITAMIN) tablet Take 1 tablet by mouth daily.         Marland Kitchen octreotide (SANDOSTATIN) 100 MCG/ML SOLN Inject 150 mcg into the skin every 12 (twelve) hours.       . ondansetron (ZOFRAN) 8 MG tablet Take 8 mg by mouth every 8 (eight) hours as needed. For nausea      . pantoprazole (PROTONIX) 40 MG tablet TAKE 1 TABLET BY MOUTH TWICE DAILY 30 MINUTES BEFORE BREAKFAST AND BEFORE EVENING MEAL  60 tablet  5  . zolpidem (AMBIEN) 10 MG tablet Take 10 mg by mouth at bedtime as needed. For sleep        REVIEW OF SYSTEMS: Arly.Keller ] denotes positive finding; [  ] denotes negative finding  CARDIOVASCULAR:  [ ]  chest pain   [ ]  chest pressure   [ ]  palpitations   [ ]  orthopnea  Arly.Keller ] dyspnea on exertion   Arly.Keller ] claudication   [ ]  rest pain   [ ]  DVT   [ ]  phlebitis PULMONARY:   [ ]  productive cough   [ ]  asthma   [ ]  wheezing NEUROLOGIC:   Arly.Keller ] weakness both legs [ ]  paresthesias  [ ]  aphasia  [ ]  amaurosis  [ ]  dizziness HEMATOLOGIC:   [ ]  bleeding problems   [ ]  clotting disorders MUSCULOSKELETAL:  [ ]  joint pain   [ ]  joint swelling [ ]  leg swelling GASTROINTESTINAL: [ ]   blood in stool  [ ]   hematemesis GENITOURINARY:  [ ]   dysuria  [ ]   hematuria PSYCHIATRIC:  [ ]  history of major depression INTEGUMENTARY:  [ ]  rashes  [ ]  ulcers CONSTITUTIONAL:  [ ]  fever   [ ]  chills  PHYSICAL EXAM: Filed Vitals:   07/21/11 1137 07/21/11 1139  BP: 135/96 132/84  Pulse: 88 95  Resp: 16   Height: 5\' 3"  (1.6 m)   Weight: 113 lb (51.256 kg)   SpO2: 100% 99%   Body mass index is 20.02 kg/(m^2). GENERAL: The patient is a well-nourished female, in no acute distress. The vital signs are documented above. CARDIOVASCULAR: There is a regular rate and rhythm without significant murmur appreciated. She has bilateral carotid bruits. She has no significant lower extremity swelling. PULMONARY: There is good air exchange bilaterally without wheezing or rales. ABDOMEN: Soft and non-tender with normal pitched bowel sounds.  MUSCULOSKELETAL: There are no major deformities  or cyanosis. NEUROLOGIC: No focal weakness or paresthesias are detected. SKIN: There are no ulcers or rashes noted. PSYCHIATRIC:  The patient has a normal affect.  DATA:  I have independently interpreted her carotid duplex scan which shows a 60-79% right carotid stenosis a mild 40-59% left carotid stenosis. Both vertebral arteries are patent with normally directed flow.  MEDICAL ISSUES:  Carotid stenosis The 60-79% right carotid stenosis remained stable. She understands we would not consider right carotid endarterectomy unless the stenosis progressed to greater than 80%. I've ordered a follow up carotid duplex scan in 6 months. I'll plan on seeing her back in one year and last is any significant change in her duplex in 6 months. She knows to call sooner she has problems. In the meantime she knows to continue taking her aspirin.    DICKSON,CHRISTOPHER S [facility] Beeper: [phone]

## 2011-07-21 NOTE — Telephone Encounter (Signed)
I called the patient's insurance company, spoke with [place]. She explained that the Octreotide 1 MG/ML injection, had been approved on July 17 2011 and was good until December 31 st 2013. I contacted the patient's pharmacy[facility] and made them aware.

## 2011-07-21 NOTE — Assessment & Plan Note (Signed)
The 60-79% right carotid stenosis remained stable. She understands we would not consider right carotid endarterectomy unless the stenosis progressed to greater than 80%. I've ordered a follow up carotid duplex scan in 6 months. I'll plan on seeing her back in one year and last is any significant change in her duplex in 6 months. She knows to call sooner she has problems. In the meantime she knows to continue taking her aspirin.

## 2011-07-22 LAB — HEPATIC FUNCTION PANEL
AST: 34 U/L (ref 0–37)
Albumin: 4.1 g/dL (ref 3.5–5.2)
Alkaline Phosphatase: 114 U/L (ref 39–117)
Bilirubin, Direct: 0.1 mg/dL (ref 0.0–0.3)
Total Protein: 7 g/dL (ref 6.0–8.3)

## 2011-07-27 ENCOUNTER — Other Ambulatory Visit: Payer: Self-pay | Admitting: Cardiology

## 2011-07-27 ENCOUNTER — Telehealth (INDEPENDENT_AMBULATORY_CARE_PROVIDER_SITE_OTHER): Payer: Self-pay | Admitting: *Deleted

## 2011-07-27 NOTE — Telephone Encounter (Signed)
[name], [phone] ext [phone], called LM asking for Tammy to please give her a return call.

## 2011-07-28 ENCOUNTER — Other Ambulatory Visit: Payer: Self-pay | Admitting: Neurosurgery

## 2011-07-28 DIAGNOSIS — M47816 Spondylosis without myelopathy or radiculopathy, lumbar region: Secondary | ICD-10-CM

## 2011-07-28 NOTE — Procedures (Unsigned)
CAROTID DUPLEX EXAM  INDICATION:  Carotid stenosis.  HISTORY: Diabetes:  No. Cardiac:  Cardiomyopathy. Hypertension:  Yes. Smoking:  Previous. Previous Surgery:  Left carotid endarterectomy on 03/22/2007. CV History:  Currently asymptomatic. Amaurosis Fugax No, Paresthesias No, Hemiparesis No.                                      RIGHT             LEFT Brachial systolic pressure:         152               144 Brachial Doppler waveforms:         WNL               WNL Vertebral direction of flow:        Antegrade         Antegrade DUPLEX VELOCITIES (cm/sec) CCA peak systolic                   142               149 ECA peak systolic                   284               89 ICA peak systolic                   245               105 ICA end diastolic                   70                45 PLAQUE MORPHOLOGY:                  Calcified         Heterogenous PLAQUE AMOUNT:                      Moderate-to-severe                  Mild PLAQUE LOCATION:                    CCA/ICA/ECA       CCA/ECA  IMPRESSION: 1. Right internal carotid artery stenosis present in the 60% to 79%     range. 2. Right external carotid artery stenosis present. 3. Left internal carotid artery is patent with history of     endarterectomy, velocities suggest stenosis in the 40% to 59% range     by velocity criteria; however, most likely due to vessel     tortuosity.  No hemodynamically significant plaque is identified. 4. Bilateral vertebral arteries are patent and antegrade. 5. Increase in the disease process since previous study done here on     10/10/2007; however, consistent with study done in May 2012 at an     outside facility.  ___________________________________________ Di Kindle. Edilia Bo, M.D.  SH/MEDQ  D:  07/21/2011  T:  07/21/2011  Job:  [id number]

## 2011-07-29 ENCOUNTER — Ambulatory Visit
Admission: RE | Admit: 2011-07-29 | Discharge: 2011-07-29 | Disposition: A | Discharge status: Home / Self Care | Payer: Medicare Other | Source: Ambulatory Visit | Attending: Neurosurgery | Admitting: Neurosurgery

## 2011-07-29 DIAGNOSIS — M47816 Spondylosis without myelopathy or radiculopathy, lumbar region: Secondary | ICD-10-CM

## 2011-07-29 DIAGNOSIS — M5126 Other intervertebral disc displacement, lumbar region: Secondary | ICD-10-CM

## 2011-07-29 NOTE — Telephone Encounter (Signed)
I called the Ms. Christell Constant and she is out of the office today and will return 07-30-11. I left a message asking that she call me back tomorrow and before 12 pm.

## 2011-07-29 NOTE — Discharge Instructions (Addendum)

## 2011-08-06 ENCOUNTER — Encounter (INDEPENDENT_AMBULATORY_CARE_PROVIDER_SITE_OTHER): Payer: Self-pay | Admitting: *Deleted

## 2011-09-06 ENCOUNTER — Ambulatory Visit (INDEPENDENT_AMBULATORY_CARE_PROVIDER_SITE_OTHER): Payer: Medicare Other | Admitting: Internal Medicine

## 2011-09-06 ENCOUNTER — Encounter (INDEPENDENT_AMBULATORY_CARE_PROVIDER_SITE_OTHER): Payer: Self-pay | Admitting: Internal Medicine

## 2011-09-06 VITALS — BP 130/90 | HR 92 | Temp 96.7°F | Resp 18 | Ht 63.0 in | Wt 109.6 lb

## 2011-09-06 DIAGNOSIS — IMO0002 Reserved for concepts with insufficient information to code with codable children: Secondary | ICD-10-CM | POA: Insufficient documentation

## 2011-09-06 DIAGNOSIS — K6389 Other specified diseases of intestine: Secondary | ICD-10-CM

## 2011-09-06 DIAGNOSIS — R197 Diarrhea, unspecified: Secondary | ICD-10-CM

## 2011-09-06 DIAGNOSIS — K529 Noninfective gastroenteritis and colitis, unspecified: Secondary | ICD-10-CM | POA: Insufficient documentation

## 2011-09-06 MED ORDER — NYSTATIN-TRIAMCINOLONE 100000-0.1 UNIT/GM-% EX OINT: TOPICAL_OINTMENT | Freq: Two times a day (BID) | CUTANEOUS | Status: DC

## 2011-09-06 MED ORDER — METRONIDAZOLE 250 MG PO TABS: 250.0000 mg | ORAL_TABLET | Freq: Three times a day (TID) | ORAL | Status: AC

## 2011-09-06 NOTE — Patient Instructions (Addendum)
Apply nystatin triamcinolone cream to skin below ileostomy twice daily or 2 weeks and thereafter as needed. Metronidazole 250 mg by mouth 3 times a day for 10 days. Please call us with progress report when you finish metronidazole. Eat 6 small meals daily and take one Creon with each small meal

## 2011-09-06 NOTE — Progress Notes (Signed)
Presenting complaint;  Followup for chronic diarrhea.  Subjective:  [name] is here for scheduled visit. She was last seen in December 2012. She has gained another 2-1/2 pounds. She has not required a trip to the emergency room or hospital since her last visit. She says the ileostomy output varies. At the minimum she is to empty the bag 4 times a day and sometimes twice a month. She is always afraid to leave the house because she believes is going to leak and lead to a mess. She says she also had difficulty coping with her condition and finally coming to terms with it. She continues to complain of lots of gas and and pain associated with it. He is not noticing any drainage her mucous fistula. She also complains of skin irritation below the ileostomy. She saw Dr. Margo Common on 08/26/2011. She believes addition of Creon also has helped reduce ileostomy output   Current Medications: Current Outpatient Prescriptions  Medication Sig Dispense Refill  . amitriptyline (ELAVIL) 25 MG tablet Take 50 mg by mouth at bedtime.       Marland Kitchen aspirin EC 81 MG tablet Take 1 tablet (81 mg total) by mouth daily.      Marland Kitchen atorvastatin (LIPITOR) 40 MG tablet TAKE ONE TABLET BY MOUTH ONE TIME DAILY  30 tablet  5  . Cholecalciferol (VITAMIN D) 1000 UNITS capsule Take 1,000 Units by mouth daily.       . folic acid (FOLVITE) 1 MG tablet Take 1 mg by mouth daily.        Marland Kitchen HYDROcodone-acetaminophen (VICODIN) 5-500 MG per tablet Take 1 tablet by mouth every 4 (four) hours as needed. For pain       . lipase/protease/amylase (CREON-10/PANCREASE) 12000 UNITS CPEP Take 1 capsule by mouth 3 (three) times daily. Per Patient she takes 24,000 units      . lisinopril (PRINIVIL,ZESTRIL) 5 MG tablet TAKE ONE TABLET BY MOUTH ONE TIME DAILY  30 tablet  5  . loperamide (IMODIUM) 2 MG capsule Take 2 mg by mouth 4 (four) times daily as needed. For diarrhea       . LYRICA 50 MG capsule Take 50 mg by mouth daily as needed. For nerve pain      .  megestrol (MEGACE) 40 MG/ML suspension Take 200 mg by mouth 2 (two) times daily before a meal.       . metoCLOPramide (REGLAN) 10 MG tablet Take 10 mg by mouth 2 (two) times daily.        . metoprolol (TOPROL XL) 50 MG 24 hr tablet Take 1 tablet (50 mg total) by mouth daily.  30 tablet  6  . Multiple Vitamin (MULTIVITAMIN) tablet Take 1 tablet by mouth daily.        Marland Kitchen octreotide (SANDOSTATIN) 100 MCG/ML SOLN Inject 150 mcg into the skin every 12 (twelve) hours.       . ondansetron (ZOFRAN) 8 MG tablet Take 8 mg by mouth every 8 (eight) hours as needed. For nausea      . pantoprazole (PROTONIX) 40 MG tablet TAKE 1 TABLET BY MOUTH TWICE DAILY 30 MINUTES BEFORE BREAKFAST AND BEFORE EVENING MEAL  60 tablet  5  . zolpidem (AMBIEN) 10 MG tablet Take 10 mg by mouth at bedtime as needed. For sleep         Objective: Blood pressure 130/90, pulse 92, temperature 96.7 F (35.9 C), resp. rate 18. Patient is not in any distress. Conjunctiva is pink. Sclera is nonicteric Oropharyngeal  mucosa is normal. No neck masses or thyromegaly noted. Cardiac exam with regular rhythm normal S1 and S2. No murmur or gallop noted. Lungs are clear to auscultation. 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 Extensive scarring to the abdomen which is soft. Mild tenderness noted below the ileostomy. No organomegaly or masses the No LE edema or clubbing noted.  Labs/studies Results: From 08/26/2011 bilirubin 1.1, AP 104, AST 45, ALT 42, albumin 4.5, calcium 9.4, BUN 21, creatinine 1.31, serum amylase mildly elevated at 133.  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 She still appears to have significant ileostomy output and may  be mildly on the dehydrated side. Reassuring to note that her weight is gradually coming up. She will continue octreotide at present dose. #2. Peristomal skin irritation   Plan:  Another course of metronidazole 250 mg by mouth 3 times a day for 10 days. Patient advised to call us with progress report when she finishes metronidazole Patient advised to eat cheese as it might help with diarrhea. Creon samples given. Patient advised to eat 6 small meals and take one capsule Creon with each meal. Mycolog-II cream to be applied to skin around the ileostomy twice daily for 2 weeks and then when necessary. Office visit in 3 months.

## 2011-09-09 ENCOUNTER — Encounter (INDEPENDENT_AMBULATORY_CARE_PROVIDER_SITE_OTHER): Payer: Self-pay

## 2011-09-28 ENCOUNTER — Telehealth (INDEPENDENT_AMBULATORY_CARE_PROVIDER_SITE_OTHER): Payer: Self-pay | Admitting: *Deleted

## 2011-09-28 NOTE — Telephone Encounter (Signed)
[name] states that she completed the antibiotics on 09/24/11. The weekend was rough . She had a lot of diarrhea and gas pain. Everything that she ate or drank went straight through. She has been having to empty her bag about every 30 minutes,and she states that most of that is liquid. She has continued to force fluids to keep hydrated and is feeling better today.

## 2011-10-02 NOTE — Telephone Encounter (Signed)
Patient's call returned. She is able to be more cheese. Still having intermittent flatulence and overall feels better.

## 2011-10-11 ENCOUNTER — Encounter: Payer: Self-pay | Admitting: Internal Medicine

## 2011-10-11 ENCOUNTER — Ambulatory Visit (INDEPENDENT_AMBULATORY_CARE_PROVIDER_SITE_OTHER): Payer: Medicare Other | Admitting: Internal Medicine

## 2011-10-11 VITALS — BP 120/66 | HR 98 | Resp 18 | Ht 63.0 in | Wt 112.8 lb

## 2011-10-11 DIAGNOSIS — R55 Syncope and collapse: Secondary | ICD-10-CM

## 2011-10-11 DIAGNOSIS — E889 Metabolic disorder, unspecified: Secondary | ICD-10-CM

## 2011-10-11 DIAGNOSIS — E7400 Glycogen storage disease, unspecified: Secondary | ICD-10-CM

## 2011-10-11 DIAGNOSIS — E639 Nutritional deficiency, unspecified: Secondary | ICD-10-CM

## 2011-10-11 DIAGNOSIS — I10 Essential (primary) hypertension: Secondary | ICD-10-CM

## 2011-10-11 NOTE — Assessment & Plan Note (Signed)
ILR today is interrogated and is normal No arrhythmias Her device has episodes flagged as afib, however these are sinus with PACs

## 2011-10-11 NOTE — Progress Notes (Signed)
PCP: Rudi Heap, MD, MD Primary Cardiologist:  Dr Lynnae Sandhoff is a 71 y.o. female who presents today for routine electrophysiology followup.  Since last being seen in our clinic, the patient reports doing very well.  She remains active. Today, she denies symptoms of palpitations, chest pain, shortness of breath (above baseline),  lower extremity edema, dizziness, presyncope, or syncope.  The patient is otherwise without complaint today.   Past Medical History  Diagnosis Date  . History of CEA (carotid endarterectomy) 03/2007    Left CEA by Dr. Cari Caraway  . Hypertension   . Hyperlipidemia   . GERD (gastroesophageal reflux disease)   . 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 anemia of chronic kidney disease though renal function does not seem severely impaired.  She had repeat bone marrow biopsy by Dr. Cleone Slim 9/10.  Marland Kitchen Episode of syncope     dysauthonomia/ orthostatic syncope - Patient has Medtronic loop recorder implanted 8/10.  No events so far - May be due only to orthostasis  . 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    . History of MRI 01/2009    CardiacMRI/EF 66%, mild to moderate concentric LVH, RV size & systolic function normAL w/o evidence for ARVC, trivial pericardial effusion/On delayed enhancement, it was difficult to obtain the TI/There was patchy mid-wall basal delayed enhancement/There was partially circumferential subendocardialdelayed enhancement in the mid to apical LV/This was suggestive of infiltrative cardiomyopathy versus  . History of CT scan of chest 01/2009    to assess for sarcoidosis: no evidence by CT for pulmonary sarcoid  . 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   . Secretory diarrhea      treated with octreotide.   . Esophageal stricture 05/2010    with dilation  .  Amyloidosis   . Shortness of breath     when tired  . Cancer 2010   Past Surgical History  Procedure Date  . Implantable loop recorder 2010    MDT for unexplained syncope  . Partial colectomy 1/11    for diverticular perforation  . Carotid endarterectomy 10/08    left  . Abdominal hysterectomy   . Upper gastrointestinal endoscopy 05/20/2010    EGD ED  . Colonoscopy 03/14/2009  . Ercp 04/09/2011    Procedure: ENDOSCOPIC RETROGRADE CHOLANGIOPANCREATOGRAPHY (ERCP);  Surgeon: Malissa Hippo, MD;  Location: [facility];  Service: Endoscopy;  Laterality: N/A;  10:25  . Sphincterotomy 04/09/2011    Procedure: SPHINCTEROTOMY;  Surgeon: Malissa Hippo, MD;  Location: [facility];  Service: Endoscopy;  Laterality: N/A;  with removal of multiple pigment stones    Current Outpatient Prescriptions  Medication Sig Dispense Refill  . amitriptyline (ELAVIL) 25 MG tablet Take 50 mg by mouth at bedtime.       Marland Kitchen aspirin EC 81 MG tablet Take 1 tablet (81 mg total) by mouth daily.      Marland Kitchen atorvastatin (LIPITOR) 40 MG tablet TAKE ONE TABLET BY MOUTH ONE TIME DAILY  30 tablet  5  . Cholecalciferol (VITAMIN D) 1000 UNITS capsule Take 1,000 Units by mouth daily.       . folic acid (FOLVITE) 1 MG tablet Take 1 mg by mouth daily.        Marland Kitchen HYDROcodone-acetaminophen (VICODIN) 5-500 MG per tablet Take 1 tablet  by mouth every 4 (four) hours as needed. For pain       . lipase/protease/amylase (CREON-10/PANCREASE) 12000 UNITS CPEP Take 1 capsule by mouth 3 (three) times daily. Per Patient she takes 24,000 units      . lisinopril (PRINIVIL,ZESTRIL) 5 MG tablet TAKE ONE TABLET BY MOUTH ONE TIME DAILY  30 tablet  5  . loperamide (IMODIUM) 2 MG capsule Take 2 mg by mouth 4 (four) times daily as needed. For diarrhea       . LYRICA 50 MG capsule Take 50 mg by mouth daily as needed. For nerve pain      . megestrol (MEGACE) 40 MG/ML suspension Take 200 mg by mouth 2 (two) times daily before a meal.       . metoCLOPramide (REGLAN)  10 MG tablet Take 10 mg by mouth 2 (two) times daily.        . metoprolol (TOPROL XL) 50 MG 24 hr tablet Take 1 tablet (50 mg total) by mouth daily.  30 tablet  6  . Multiple Vitamin (MULTIVITAMIN) tablet Take 1 tablet by mouth daily.        Marland Kitchen nystatin-triamcinolone ointment (MYCOLOG) Apply topically 2 (two) times daily. Apply to skin under ileostomy twice daily until in addition gone and then on an as-needed basis  60 g  0  . octreotide (SANDOSTATIN) 100 MCG/ML SOLN Inject 150 mcg into the skin every 12 (twelve) hours.       . ondansetron (ZOFRAN) 8 MG tablet Take 8 mg by mouth every 8 (eight) hours as needed. For nausea      . pantoprazole (PROTONIX) 40 MG tablet TAKE 1 TABLET BY MOUTH TWICE DAILY 30 MINUTES BEFORE BREAKFAST AND BEFORE EVENING MEAL  60 tablet  5  . zolpidem (AMBIEN) 10 MG tablet Take 10 mg by mouth at bedtime as needed. For sleep        Physical Exam: Filed Vitals:   10/11/11 0952  BP: 120/66  Pulse: 98  Resp: 18  Height: 5\' 3"  (1.6 m)  Weight: 112 lb 12.8 oz (51.166 kg)  SpO2: 98%    GEN- The patient is well appearing, alert and oriented x 3 today.   Head- normocephalic, atraumatic Eyes-  Sclera clear, conjunctiva pink Ears- hearing intact Oropharynx- clear Lungs- Clear to ausculation bilaterally, normal work of breathing Chest- ILR pocket is well healed Heart- Regular rate and rhythm  GI- soft, NT, ND, + BS Extremities- no clubbing, cyanosis, or edema  Pacemaker interrogation- reviewed in detail today,  See PACEART report  Assessment and Plan:

## 2011-10-11 NOTE — Assessment & Plan Note (Signed)
Stable No change required today  

## 2011-10-11 NOTE — Patient Instructions (Signed)
Your physician recommends that you schedule a follow-up appointment in: 3 months in the device clinic and 12 months with Dr. Allred  

## 2011-10-14 ENCOUNTER — Telehealth (INDEPENDENT_AMBULATORY_CARE_PROVIDER_SITE_OTHER): Payer: Self-pay | Admitting: *Deleted

## 2011-10-14 NOTE — Telephone Encounter (Signed)
Talked with [name]. 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 Is there anything else that she can take for her diarrhea? She mentioned that she could take a lot of Immodium and that she ws not complaining just wanting to know what other options she may have. The rash that she showed Dr.Rehman at the time of her O.V. , she states that it has worsened. The Nystatin  Cream is not working.Is there anything else that she can try for this? I explained that Dr.Rehman is out until 10-25-11 but I would address this with him when he called me 10/15/11 and call her back.

## 2011-10-14 NOTE — Telephone Encounter (Signed)
Patient wants you to call her in regards to a prescription that was called in for her and also to talk about a rash she has -- [phone]

## 2011-10-19 ENCOUNTER — Telehealth (INDEPENDENT_AMBULATORY_CARE_PROVIDER_SITE_OTHER): Payer: Self-pay | Admitting: *Deleted

## 2011-10-19 NOTE — Telephone Encounter (Signed)
Ms. [name] called to make sure that we had rec'd the application directly from the patient and the status of the application. The number that she left was [phone] , ext: [phone]. I did return her call at 2 pm and left a message that we had rec'd it and were awaiting Dr.Rehman's return to office to complete his part. Them it would be returned to the patient upon her request. Left my direct number and also made her aware that I would be out of the office until May 13 th. Patient called and made aware also.

## 2011-10-26 ENCOUNTER — Encounter: Payer: Medicare Other | Admitting: Hematology and Oncology

## 2011-10-26 DIAGNOSIS — C9200 Acute myeloblastic leukemia, not having achieved remission: Secondary | ICD-10-CM

## 2011-10-27 NOTE — Telephone Encounter (Signed)
All paper work faxed on May 14,2013

## 2011-11-09 ENCOUNTER — Ambulatory Visit: Payer: Medicare Other | Admitting: Cardiology

## 2011-11-10 ENCOUNTER — Other Ambulatory Visit: Payer: Self-pay | Admitting: Neurosurgery

## 2011-11-10 DIAGNOSIS — M47816 Spondylosis without myelopathy or radiculopathy, lumbar region: Secondary | ICD-10-CM

## 2011-11-15 ENCOUNTER — Inpatient Hospital Stay: Admission: RE | Admit: 2011-11-15 | Payer: Medicare Other | Source: Ambulatory Visit

## 2011-11-16 DIAGNOSIS — N179 Acute kidney failure, unspecified: Secondary | ICD-10-CM

## 2011-11-18 ENCOUNTER — Telehealth (INDEPENDENT_AMBULATORY_CARE_PROVIDER_SITE_OTHER): Payer: Self-pay | Admitting: *Deleted

## 2011-11-18 NOTE — Telephone Encounter (Signed)
I called patient's insurance company,who patient is trying to get assistance for Sandostatin injections.Ianswered their questions, they will only help with the Sandostatin LAR. Dr.Rehman called and verbal order given for Sandostatin LAR  30 mg to be given IM every month 11 refills. This information was called back to [employer], they are going to call patient to verify other information. If approved they will send medication to our office, patient will then be contacted. Patient was called and advised

## 2011-11-19 ENCOUNTER — Ambulatory Visit
Admission: RE | Admit: 2011-11-19 | Discharge: 2011-11-19 | Disposition: A | Discharge status: Home / Self Care | Payer: Medicare Other | Source: Ambulatory Visit | Attending: Neurosurgery | Admitting: Neurosurgery

## 2011-11-19 DIAGNOSIS — M47816 Spondylosis without myelopathy or radiculopathy, lumbar region: Secondary | ICD-10-CM

## 2011-11-19 MED ORDER — METHYLPREDNISOLONE ACETATE 40 MG/ML INJ SUSP (RADIOLOG
120.0000 mg | Freq: Once | INTRAMUSCULAR | Status: AC
  Administered 2011-11-19: 120 mg via EPIDURAL

## 2011-11-19 MED ORDER — IOHEXOL 180 MG/ML  SOLN
1.0000 mL | Freq: Once | INTRAMUSCULAR | Status: AC | PRN
  Administered 2011-11-19: 1 mL via EPIDURAL

## 2011-11-19 NOTE — Discharge Instructions (Signed)

## 2011-11-23 ENCOUNTER — Encounter: Payer: Self-pay | Admitting: Cardiology

## 2011-11-23 ENCOUNTER — Ambulatory Visit (INDEPENDENT_AMBULATORY_CARE_PROVIDER_SITE_OTHER): Payer: Medicare Other | Admitting: Cardiology

## 2011-11-23 VITALS — BP 130/82 | HR 87 | Ht 63.0 in | Wt 102.0 lb

## 2011-11-23 DIAGNOSIS — I43 Cardiomyopathy in diseases classified elsewhere: Secondary | ICD-10-CM

## 2011-11-23 DIAGNOSIS — I6529 Occlusion and stenosis of unspecified carotid artery: Secondary | ICD-10-CM

## 2011-11-23 DIAGNOSIS — I5032 Chronic diastolic (congestive) heart failure: Secondary | ICD-10-CM

## 2011-11-23 DIAGNOSIS — R0602 Shortness of breath: Secondary | ICD-10-CM

## 2011-11-23 DIAGNOSIS — E785 Hyperlipidemia, unspecified: Secondary | ICD-10-CM

## 2011-11-23 DIAGNOSIS — E639 Nutritional deficiency, unspecified: Secondary | ICD-10-CM

## 2011-11-23 DIAGNOSIS — R42 Dizziness and giddiness: Secondary | ICD-10-CM

## 2011-11-23 LAB — BASIC METABOLIC PANEL
BUN: 21 mg/dL (ref 6–23)
Chloride: 107 mEq/L (ref 96–112)
Creatinine, Ser: 1.2 mg/dL (ref 0.4–1.2)
Glucose, Bld: 85 mg/dL (ref 70–99)
Potassium: 4.7 mEq/L (ref 3.5–5.1)

## 2011-11-23 LAB — LIPID PANEL
HDL: 66.8 mg/dL (ref >39.00)
Total CHOL/HDL Ratio: 3
Triglycerides: 159 mg/dL — ABNORMAL HIGH (ref 0.0–149.0)

## 2011-11-23 NOTE — Patient Instructions (Signed)
Stop lisinopril.  Your physician recommends that you return for a FASTING lipid profile / BMET/BNP today.  Your physician wants you to follow-up in: 6 months with Dr Shirlee Latch. (December 2013). You will receive a reminder letter in the mail two months in advance. If you don't receive a letter, please call our office to schedule the follow-up appointment.

## 2011-11-24 NOTE — Assessment & Plan Note (Signed)
Check lipids with goal LDL < 70 given known vascular disease.

## 2011-11-24 NOTE — Assessment & Plan Note (Signed)
I think this is primarily due to dehydration in the setting of secretory diarrhea, probably made worse by LV mid-cavity gradient.  As above, stopping lisinopril.  Needs to stay hydrated.  No arrhythmias on loop recorder => doubt her symptoms are arrhythmia-related.

## 2011-11-24 NOTE — Assessment & Plan Note (Signed)
Patient has a cardiomyopathy with preserved LV systolic function.  Delayed enhancement on MRI suggested an infiltrative disease or hypertrophic cardiomyopathy rather than coronary disease, and she has had a negative myoview.  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  This was confirmed by testing at Dr. Bertha Stakes office as well.  CT chest was not suggestive of pulmonary sarcoidosis. Given the finding of monoclonal light chains and suggestive MRI findings, she was treated for amyloidosis by Dr. Cleone Slim with Velcade and dexamethasone.  Unfortunately, she developed severe diarrhea and had 2 bowel perforations. Therefore, she is off treatment now.    - She does not appear significantly volume overloaded today.  No Lasix as she does not appear overloaded and this could worsen her mid-cavity gradient and orthostasis.  - Continue Toprol XL to 50 mg daily as this may help decrease LV mid-cavity gradient.   - Check BMET and BNP today.  - Given orthostatic symptoms and syncope, I asked her to stop lisinopril.

## 2011-11-24 NOTE — Progress Notes (Signed)
Patient ID: [name], female   DOB: 09-Jul-1940, 71 y.o.   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  She underwent treatment with Velcade and dexamethasone for systemic amyloidosis.  Monoclonal light chain count fell with treatment, but she developed intractable diarrhea with bowel perforations in 1/11 and 7/11.  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  Last echo in 5/12 showed mild LVH, vigorous LV systolic function, mild pulmonary hypertension (LV mid cavity gradient was not reported).  She had recurrent pancreatitis and had ERCP with sphincterotomy for choledocholithiasis in 10/12.    She has stable dyspnea after walking 50-100 feet.  No dyspnea walking around her house.  She continues to have copious ostomy output (secretory diarrhea) and is on octreotide.  No chest pain.  She continues to have orthostatic symptoms.  She passed out after standing up last week and was evaluated at [facility].  She was thought to be dehydrated because of her ostomy output. Loop recorder has shown no arrhythmias.  Labs (7/10): LDL 87, HDL 52, ALT 46, AST 33 Labs (8/10): K 4.4, creatinine 1.0, BNP 119, HCT 30.6, SPEP negative, UPEP positive for monoclonal lambda light chains.  Labs (9/10): creatinine 0.93, C diff negative  Labs (4/11): HDL 58, LDL 127, K 3.5, creatinine 0.8 Labs (3/12): K 4.9, creatinine 1.04, LDL 69, HDL 45 Labs (10/12): K 4.4, creatinine 1.23 Labs (11/12): LDL 54, HDL 54  Allergies (verified):  No Known Drug Allergies  Past Medical History: 1.  Left CEA (10/08).  Most recent carotid dopplers 2/13 with 60-79% RICA stenosis, LICA ok s/p CEA. This is followed at [facility].   2.  HTN 3.  GERD 4.  Hyperlipidemia 5.  Anemia:  She has been on Aranesp for ? anemia of chronic kidney disease though renal function does not seem severely impaired.  She had repeat  bone marrow biopsy by Dr. Cleone Slim 9/10. 6.  s/p hysterectomy 7.  Mid-cavity dynamic LV gradient:  Echo (4/11) with EF 65-70%, severe concentric LVH, peak LV mid-cavity gradient reaching 45 mmHg with valsalva, grade I diastolic dysfunction, RV hypertrophy, small pericardial effusion.  Echo (5/12) with mild LVH, EF 65-70%, PASP 40 mmHg.  8.  Adenosine myoview (5/10):  No evidence for ischemia or infarction.  9.  Episodes of syncope and presyncope.  - Patient has Medtronic loop recorder implanted 8/10.  No events so far. - May be due only to orthostasis 10. 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   - Cardiac MRI (8/10): EF 66%, mild to moderate concentric LVH, RV size and systolic function normal with no evidence for ARVC, trivial pericardial effusion.  On delayed enhancement, it was difficult to obtain the TI.  There was patchy mid-wall basal delayed enhancement.  There was partially circumferential subendocardial delayed enhancement in the mid to apical LV.  This was suggestive of infiltrative cardiomyopathy versus possible HCM.  - SPEP negative.  UPEP positive for monoclonal lambda light chains.  Monoclonal light chains confirmed on testing by Dr. Cleone Slim.  - CT chest (8/10) to assess for sarcoidosis: no evidence by CT for pulmonary sarcoid.  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  11.  C. difficile colitis  12. Diverticulitis (1/11) with perforation and colectomy/colostomy.  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  14. Esophageal stricture with dilation 06-15-2023  Family History: Mother died suddenly with presumed MI at 71.  Father died suddenly with presumed MI at age 90.  Cousin died suddenly, ? age.   Social History: Quit tobacco in 1980.  No ETOH or drugs.  Single, lives in [place], 1 child (daughter in [place]).  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   Review of Systems        All systems reviewed and negative except as per HPI.   Current Outpatient Prescriptions  Medication Sig Dispense Refill  . amitriptyline (ELAVIL) 25 MG tablet Take 50 mg by mouth at bedtime.       Marland Kitchen aspirin EC 81 MG tablet Take 1 tablet (81 mg total) by mouth daily.      Marland Kitchen atorvastatin (LIPITOR) 40 MG tablet TAKE ONE TABLET BY MOUTH ONE TIME DAILY  30 tablet  5  . Cholecalciferol (VITAMIN D) 1000 UNITS capsule Take 1,000 Units by mouth daily.       . folic acid (FOLVITE) 1 MG tablet Take 1 mg by mouth daily.        Marland Kitchen HYDROcodone-acetaminophen (VICODIN) 5-500 MG per tablet Take 1 tablet by mouth every 4 (four) hours as needed. For pain       . lipase/protease/amylase (CREON-10/PANCREASE) 12000 UNITS CPEP Take 1 capsule by mouth 3 (three) times daily. Per Patient she takes 24,000 units      . loperamide (IMODIUM) 2 MG capsule Take 2 mg by mouth 4 (four) times daily as needed. For diarrhea       . LYRICA 50 MG capsule Take 50 mg by mouth daily as needed. For nerve pain      . megestrol (MEGACE) 40 MG/ML suspension Take 200 mg by mouth 2 (two) times daily before a meal.       . metoCLOPramide (REGLAN) 10 MG tablet Take 10 mg by mouth 2 (two) times daily.        . metoprolol (TOPROL XL) 50 MG 24 hr tablet Take 1 tablet (50 mg total) by mouth daily.  30 tablet  6  . Multiple Vitamin (MULTIVITAMIN) tablet Take 1 tablet by mouth daily.        Marland Kitchen nystatin-triamcinolone ointment (MYCOLOG) Apply topically 2 (two) times daily. Apply to skin under ileostomy twice daily until in addition gone and then on an as-needed basis  60 g  0  . octreotide (SANDOSTATIN) 100 MCG/ML SOLN Inject 150 mcg into the skin every 12 (twelve) hours.       . ondansetron (ZOFRAN) 8 MG tablet Take 8 mg by mouth every 8 (eight) hours as needed. For nausea      . pantoprazole (PROTONIX) 40 MG tablet TAKE 1 TABLET BY MOUTH TWICE DAILY 30 MINUTES BEFORE BREAKFAST AND BEFORE  EVENING MEAL  60 tablet  5  . zolpidem (AMBIEN) 10 MG tablet Take 10 mg by mouth at bedtime as needed. For sleep        BP 130/82  Pulse 87  Ht 5\' 3"  (1.6 m)  Wt 46.267 kg (102 lb)  BMI 18.07 kg/m2 General:  Thin, no apparent distress Neck:  Neck supple, no JVD. No masses, thyromegaly or abnormal cervical nodes. Lungs:  Distant BS, no crackles.  Heart:  Non-displaced PMI, chest non-tender; regular rate and rhythm, S1, S2, 2/6 systolic crescendo-decrescendo mid-peaking systolic murmur along sternal border. No S3/S4.  Bilateral carotid bruits. Pedals normal pulses. No edema.  Abdomen:  Bowel sounds positive; abdomen soft  and non-tender without masses, organomegaly, or hernias noted. No hepatosplenomegaly.  Extremities:  No clubbing or cyanosis. Neurologic:  Alert and oriented x 3. Psych:  Normal affect.

## 2011-11-24 NOTE — Addendum Note (Signed)
Addended by: Reine Just on: 11/24/2011 03:28 PM   Modules accepted: Orders

## 2011-11-24 NOTE — Assessment & Plan Note (Signed)
Stable carotid disease, followed at [facility].

## 2011-11-25 ENCOUNTER — Encounter (INDEPENDENT_AMBULATORY_CARE_PROVIDER_SITE_OTHER): Payer: Self-pay

## 2011-11-26 ENCOUNTER — Encounter: Payer: Self-pay | Admitting: Cardiology

## 2011-11-30 ENCOUNTER — Encounter (INDEPENDENT_AMBULATORY_CARE_PROVIDER_SITE_OTHER): Payer: Self-pay

## 2011-12-21 ENCOUNTER — Encounter: Payer: Medicare Other | Admitting: Hematology and Oncology

## 2011-12-21 DIAGNOSIS — K861 Other chronic pancreatitis: Secondary | ICD-10-CM

## 2011-12-21 DIAGNOSIS — K56609 Unspecified intestinal obstruction, unspecified as to partial versus complete obstruction: Secondary | ICD-10-CM

## 2011-12-21 DIAGNOSIS — E859 Amyloidosis, unspecified: Secondary | ICD-10-CM

## 2011-12-21 DIAGNOSIS — D649 Anemia, unspecified: Secondary | ICD-10-CM

## 2012-01-04 ENCOUNTER — Encounter: Payer: Medicare Other | Admitting: Hematology and Oncology

## 2012-01-04 DIAGNOSIS — D649 Anemia, unspecified: Secondary | ICD-10-CM

## 2012-01-04 DIAGNOSIS — N289 Disorder of kidney and ureter, unspecified: Secondary | ICD-10-CM

## 2012-01-04 DIAGNOSIS — E859 Amyloidosis, unspecified: Secondary | ICD-10-CM

## 2012-01-04 DIAGNOSIS — R197 Diarrhea, unspecified: Secondary | ICD-10-CM

## 2012-01-06 ENCOUNTER — Encounter: Payer: Medicare Other | Admitting: Hematology and Oncology

## 2012-01-06 DIAGNOSIS — N289 Disorder of kidney and ureter, unspecified: Secondary | ICD-10-CM

## 2012-01-06 DIAGNOSIS — R197 Diarrhea, unspecified: Secondary | ICD-10-CM

## 2012-01-06 DIAGNOSIS — D649 Anemia, unspecified: Secondary | ICD-10-CM

## 2012-01-06 DIAGNOSIS — E859 Amyloidosis, unspecified: Secondary | ICD-10-CM

## 2012-01-18 ENCOUNTER — Encounter: Payer: Self-pay | Admitting: Neurosurgery

## 2012-01-19 ENCOUNTER — Encounter: Payer: Self-pay | Admitting: Internal Medicine

## 2012-01-19 ENCOUNTER — Other Ambulatory Visit (INDEPENDENT_AMBULATORY_CARE_PROVIDER_SITE_OTHER): Payer: Medicare Other | Admitting: *Deleted

## 2012-01-19 ENCOUNTER — Encounter: Payer: Self-pay | Admitting: Neurosurgery

## 2012-01-19 ENCOUNTER — Ambulatory Visit (INDEPENDENT_AMBULATORY_CARE_PROVIDER_SITE_OTHER): Payer: Medicare Other | Admitting: Neurosurgery

## 2012-01-19 ENCOUNTER — Ambulatory Visit (INDEPENDENT_AMBULATORY_CARE_PROVIDER_SITE_OTHER): Payer: Medicare Other | Admitting: Cardiology

## 2012-01-19 VITALS — BP 140/77 | HR 80 | Resp 14 | Ht 63.0 in | Wt 110.9 lb

## 2012-01-19 DIAGNOSIS — I6529 Occlusion and stenosis of unspecified carotid artery: Secondary | ICD-10-CM

## 2012-01-19 DIAGNOSIS — Z48812 Encounter for surgical aftercare following surgery on the circulatory system: Secondary | ICD-10-CM

## 2012-01-19 DIAGNOSIS — R55 Syncope and collapse: Secondary | ICD-10-CM

## 2012-01-19 NOTE — Progress Notes (Signed)
ILR check only, see PaceArt for details.

## 2012-01-19 NOTE — Patient Instructions (Signed)
Your physician wants you to follow-up in: 6 months with Dr. Allred. You will receive a reminder letter in the mail two months in advance. If you don't receive a letter, please call our office to schedule the follow-up appointment.  

## 2012-01-19 NOTE — Progress Notes (Signed)
[facility] Office Note  CC: Six-month carotid surveillance Referring Physician: Edilia Bo  History of Present Illness: 71 year old female patient of Dr. Edilia Bo that is status post left carotid endarterectomy in 2008. The patient denies any signs or symptoms of CVA, TIA, amaurosis fugax or any neural deficit. The patient also denies any new medical diagnoses recent surgery.  Past Medical History  Diagnosis Date  . History of CEA (carotid endarterectomy) 03/2007    Left CEA by Dr. Cari Caraway  . Hypertension   . Hyperlipidemia   . GERD (gastroesophageal reflux disease)   . 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 anemia of chronic kidney disease though renal function does not seem severely impaired.  She had repeat bone marrow biopsy by Dr. Cleone Slim 9/10.  Marland Kitchen Episode of syncope     dysauthonomia/ orthostatic syncope - Patient has Medtronic loop recorder implanted 8/10.  No events so far - May be due only to orthostasis  . 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    . History of MRI 01/2009    CardiacMRI/EF 66%, mild to moderate concentric LVH, RV size & systolic function normAL w/o evidence for ARVC, trivial pericardial effusion/On delayed enhancement, it was difficult to obtain the TI/There was patchy mid-wall basal delayed enhancement/There was partially circumferential subendocardialdelayed enhancement in the mid to apical LV/This was suggestive of infiltrative cardiomyopathy versus  . History of CT scan of chest 01/2009    to assess for sarcoidosis: no evidence by CT for pulmonary sarcoid  . 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   . Secretory diarrhea      treated with octreotide.   . Esophageal stricture 05/2010    with dilation  . Amyloidosis   . Shortness of breath    when tired  . Cancer 2010    ROS: [x]  Positive   [ ]  Denies    General: [ ]  Weight loss, [ ]  Fever, [ ]  chills Neurologic: [ ]  Dizziness, [ ]  Blackouts, [ ]  Seizure [ ]  Stroke, [ ]  "Mini stroke", [ ]  Slurred speech, [ ]  Temporary blindness; [ ]  weakness in arms or legs, [ ]  Hoarseness Cardiac: [ ]  Chest pain/pressure, [ ]  Shortness of breath at rest [ ]  Shortness of breath with exertion, [ ]  Atrial fibrillation or irregular heartbeat Vascular: [ ]  Pain in legs with walking, [ ]  Pain in legs at rest, [ ]  Pain in legs at night,  [ ]  Non-healing ulcer, [ ]  Blood clot in vein/DVT,   Pulmonary: [ ]  Home oxygen, [ ]  Productive cough, [ ]  Coughing up blood, [ ]  Asthma,  [ ]  Wheezing Musculoskeletal:  [ ]  Arthritis, [ ]  Low back pain, [ ]  Joint pain Hematologic: [ ]  Easy Bruising, [ ]  Anemia; [ ]  Hepatitis Gastrointestinal: [ ]  Blood in stool, [ ]  Gastroesophageal Reflux/heartburn, [ ]  Trouble swallowing Urinary: [ ]  chronic Kidney disease, [ ]  on HD - [ ]  MWF or [ ]  TTHS, [ ]  Burning with urination, [ ]  Difficulty urinating Skin: [ ]  Rashes, [ ]  Wounds Psychological: [ ]  Anxiety, [ ]  Depression   Social History History  Substance Use Topics  . Smoking status: Former Smoker -- 1.0 packs/day for 5 years    Types: Cigarettes    Quit date: 09/07/1978  .  Smokeless tobacco: Never Used  . Alcohol Use: No    Family History Family History  Problem Relation Age of Onset  . Heart failure Mother   . Hypertension Mother   . Hyperlipidemia Mother   . Heart attack Mother   . Heart failure Father   . Anesthesia problems Neg Hx   . Hypotension Neg Hx   . Malignant hyperthermia Neg Hx   . Pseudochol deficiency Neg Hx     No Known Allergies  Current Outpatient Prescriptions  Medication Sig Dispense Refill  . amitriptyline (ELAVIL) 25 MG tablet Take 50 mg by mouth at bedtime.       Marland Kitchen aspirin EC 81 MG tablet Take 1 tablet (81 mg total) by mouth daily.      Marland Kitchen atorvastatin (LIPITOR) 40 MG  tablet TAKE ONE TABLET BY MOUTH ONE TIME DAILY  30 tablet  5  . Cholecalciferol (VITAMIN D) 1000 UNITS capsule Take 1,000 Units by mouth daily.       . folic acid (FOLVITE) 1 MG tablet Take 1 mg by mouth daily.        Marland Kitchen gabapentin (NEURONTIN) 300 MG capsule Take 300 mg by mouth Three times a day.      Marland Kitchen HYDROcodone-acetaminophen (VICODIN) 5-500 MG per tablet Take 1 tablet by mouth every 4 (four) hours as needed. For pain       . lipase/protease/amylase (CREON-10/PANCREASE) 12000 UNITS CPEP Take 1 capsule by mouth 3 (three) times daily. Per Patient she takes 24,000 units      . lisinopril (PRINIVIL,ZESTRIL) 5 MG tablet Take by mouth daily.      Marland Kitchen loperamide (IMODIUM) 2 MG capsule Take 2 mg by mouth 4 (four) times daily as needed. For diarrhea       . megestrol (MEGACE) 40 MG/ML suspension Take 200 mg by mouth 2 (two) times daily before a meal.       . metoCLOPramide (REGLAN) 10 MG tablet Take 10 mg by mouth 2 (two) times daily.        . metoprolol (TOPROL XL) 50 MG 24 hr tablet Take 1 tablet (50 mg total) by mouth daily.  30 tablet  6  . Multiple Vitamin (MULTIVITAMIN) tablet Take 1 tablet by mouth daily.        Marland Kitchen nystatin-triamcinolone ointment (MYCOLOG) Apply topically 2 (two) times daily. Apply to skin under ileostomy twice daily until in addition gone and then on an as-needed basis  60 g  0  . octreotide (SANDOSTATIN) 100 MCG/ML SOLN Inject 150 mcg into the skin every 12 (twelve) hours.       . ondansetron (ZOFRAN) 8 MG tablet Take 8 mg by mouth every 8 (eight) hours as needed. For nausea      . pantoprazole (PROTONIX) 40 MG tablet TAKE 1 TABLET BY MOUTH TWICE DAILY 30 MINUTES BEFORE BREAKFAST AND BEFORE EVENING MEAL  60 tablet  5  . zolpidem (AMBIEN) 10 MG tablet Take 10 mg by mouth at bedtime as needed. For sleep        Physical Examination  Filed Vitals:   01/19/12 1546  BP: 140/77  Pulse: 80  Resp:     Body mass index is 19.65 kg/(m^2).  General:  WDWN in NAD Gait:  Normal HEENT: WNL Eyes: Pupils equal Pulmonary: normal non-labored breathing , without Rales, rhonchi,  wheezing Cardiac: RRR, without  Murmurs, rubs or gallops; Abdomen: soft, NT, no masses Skin: no rashes, ulcers noted  Vascular Exam Pulses: 3+ radial pulses bilaterally  Carotid bruits: Carotid pulses to auscultation no bruits are heard Extremities without ischemic changes, no Gangrene , no cellulitis; no open wounds;  Musculoskeletal: no muscle wasting or atrophy   Neurologic: A&O X 3; Appropriate Affect ; SENSATION: normal; MOTOR FUNCTION:  moving all extremities equally. Speech is fluent/normal  Non-Invasive Vascular Imaging CAROTID DUPLEX 01/19/2012  Right ICA 40 - 59 % stenosis Left ICA 0 - 19% stenosis   ASSESSMENT/PLAN: Asymptomatic patient with a history of right carotid stenosis in the 60s 79% range which is read lower today but may be underestimated do to acoustic shadowing. We will keep the patient on six-month rotation for surveillance, her questions were encouraged and answered. She is in agreement with this plan. The patient knows the signs and symptoms of CVA and knows to report to the nearest emergency department should that occur.  Lauree Chandler   Clinic MD: Edilia Bo

## 2012-01-24 NOTE — Addendum Note (Signed)
Addended by: Sharee Pimple on: 01/24/2012 10:15 AM   Modules accepted: Orders

## 2012-02-10 ENCOUNTER — Encounter: Payer: Medicare Other | Admitting: Hematology and Oncology

## 2012-02-10 DIAGNOSIS — E859 Amyloidosis, unspecified: Secondary | ICD-10-CM

## 2012-02-10 DIAGNOSIS — D649 Anemia, unspecified: Secondary | ICD-10-CM

## 2012-03-09 ENCOUNTER — Encounter: Payer: Medicare Other | Admitting: Hematology and Oncology

## 2012-03-09 DIAGNOSIS — E859 Amyloidosis, unspecified: Secondary | ICD-10-CM

## 2012-03-09 DIAGNOSIS — N289 Disorder of kidney and ureter, unspecified: Secondary | ICD-10-CM

## 2012-03-09 DIAGNOSIS — R197 Diarrhea, unspecified: Secondary | ICD-10-CM

## 2012-03-09 DIAGNOSIS — D649 Anemia, unspecified: Secondary | ICD-10-CM

## 2012-03-13 ENCOUNTER — Other Ambulatory Visit (INDEPENDENT_AMBULATORY_CARE_PROVIDER_SITE_OTHER): Payer: Self-pay | Admitting: Internal Medicine

## 2012-04-04 DIAGNOSIS — Z23 Encounter for immunization: Secondary | ICD-10-CM

## 2012-04-04 DIAGNOSIS — D649 Anemia, unspecified: Secondary | ICD-10-CM

## 2012-04-04 DIAGNOSIS — R197 Diarrhea, unspecified: Secondary | ICD-10-CM

## 2012-04-04 DIAGNOSIS — E859 Amyloidosis, unspecified: Secondary | ICD-10-CM

## 2012-05-02 DIAGNOSIS — D649 Anemia, unspecified: Secondary | ICD-10-CM

## 2012-05-02 DIAGNOSIS — R197 Diarrhea, unspecified: Secondary | ICD-10-CM

## 2012-05-02 DIAGNOSIS — E859 Amyloidosis, unspecified: Secondary | ICD-10-CM

## 2012-05-16 ENCOUNTER — Telehealth: Payer: Self-pay | Admitting: *Deleted

## 2012-05-16 NOTE — Telephone Encounter (Signed)
Pt called stating that she saw Dr Gaylyn Rong at the [facility] office & he referred her to the [facility] /BMT clinic & she wants to know if Dr. Gaylyn Rong has seen labs from [facility] office & [facility].  Note routed to Dr Gaylyn Rong.  Pt is at [phone].

## 2012-05-16 NOTE — Telephone Encounter (Signed)
Please direct patient to [facility].  Her oncologist is Dr. Derald Macleod.  He is supposed to receive all lab and communication from [place].  Thanks.

## 2012-05-17 ENCOUNTER — Encounter: Payer: Self-pay | Admitting: Cardiology

## 2012-05-17 ENCOUNTER — Ambulatory Visit (INDEPENDENT_AMBULATORY_CARE_PROVIDER_SITE_OTHER): Payer: Medicare Other | Admitting: Cardiology

## 2012-05-17 ENCOUNTER — Telehealth: Payer: Self-pay | Admitting: *Deleted

## 2012-05-17 ENCOUNTER — Telehealth: Payer: Self-pay | Admitting: Oncology

## 2012-05-17 VITALS — BP 140/80 | HR 83 | Ht 63.0 in | Wt 107.0 lb

## 2012-05-17 DIAGNOSIS — E859 Amyloidosis, unspecified: Secondary | ICD-10-CM

## 2012-05-17 DIAGNOSIS — I43 Cardiomyopathy in diseases classified elsewhere: Secondary | ICD-10-CM

## 2012-05-17 DIAGNOSIS — R011 Cardiac murmur, unspecified: Secondary | ICD-10-CM

## 2012-05-17 DIAGNOSIS — E785 Hyperlipidemia, unspecified: Secondary | ICD-10-CM

## 2012-05-17 DIAGNOSIS — I5032 Chronic diastolic (congestive) heart failure: Secondary | ICD-10-CM

## 2012-05-17 DIAGNOSIS — R55 Syncope and collapse: Secondary | ICD-10-CM

## 2012-05-17 DIAGNOSIS — E7800 Pure hypercholesterolemia, unspecified: Secondary | ICD-10-CM

## 2012-05-17 DIAGNOSIS — E639 Nutritional deficiency, unspecified: Secondary | ICD-10-CM

## 2012-05-17 DIAGNOSIS — I6529 Occlusion and stenosis of unspecified carotid artery: Secondary | ICD-10-CM

## 2012-05-17 MED ORDER — METOPROLOL SUCCINATE ER 50 MG PO TB24: ORAL_TABLET | ORAL | Status: DC

## 2012-05-17 NOTE — Telephone Encounter (Signed)
Returned pt's call.  Informed her Dr. Gaylyn Rong does not have her lab results,  He was only covering for Dr. Derald Macleod when he saw pt in [place].  Instructed to call Dr. Vickii Penna office for lab results.  Pt states she really liked Dr. Gaylyn Rong and wants to know if he is taking any new pts.   Informed her he is and spoke w/ dr. Gaylyn Rong.  He is willing to see pt and can see her end of Dec or beginning of January.  POF sent to scheduling.  Left pt VM informing of order for appt sent to scheduling.

## 2012-05-17 NOTE — Telephone Encounter (Signed)
s.w. pt and gv 12.30.13 appt...pr aware

## 2012-05-17 NOTE — Patient Instructions (Signed)
Your physician has recommended you make the following change in your medication: stop taking Lisinopril and increase Toprol to 75 mg daily  Your physician has requested that you have an echocardiogram. Echocardiography is a painless test that uses sound waves to create images of your heart. It provides your doctor with information about the size and shape of your heart and how well your heart's chambers and valves are working. This procedure takes approximately one hour. There are no restrictions for this procedure.  Your physician wants you to follow-up in: 4 months. You will receive a reminder letter in the mail two months in advance. If you don't receive a letter, please call our office to schedule the follow-up appointment.

## 2012-05-17 NOTE — Progress Notes (Signed)
Patient ID: [name], female   DOB: 07/21/1940, 71 y.o.   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  She underwent treatment with Velcade and dexamethasone for systemic amyloidosis.  Monoclonal light chain count fell with treatment, but she developed intractable diarrhea with bowel perforations in 1/11 and 7/11.  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  Last echo in 5/12 showed mild LVH, vigorous LV systolic function, mild pulmonary hypertension (LV mid cavity gradient was not reported).  She had recurrent pancreatitis and had ERCP with sphincterotomy for choledocholithiasis in 10/12.  Her oncologist has retired and she was sent to [facility] for evaluation of her amyloidosis this fall.   She has stable dyspnea after walking 50-100 feet.  No dyspnea walking around her house.  No chest pain.  She continues to have orthostatic symptoms but not as prominent as in the past.  She got lightheaded and fell about 3 wks ago. Loop recorder has shown no arrhythmias.  Still having copious ostomy output and using octreotide for secretory diarrhea.   ECG: NSR, left axis deviation, LBBB  Labs (7/10): LDL 87, HDL 52, ALT 46, AST 33 Labs (8/10): K 4.4, creatinine 1.0, BNP 119, HCT 30.6, SPEP negative, UPEP positive for monoclonal lambda light chains.  Labs (9/10): creatinine 0.93, C diff negative  Labs (4/11): HDL 58, LDL 127, K 3.5, creatinine 0.8 Labs (3/12): K 4.9, creatinine 1.04, LDL 69, HDL 45 Labs (10/12): K 4.4, creatinine 1.23 Labs (11/12): LDL 54, HDL 54 Labs (6/13): LDL 76, HDL 61, BNP 15.7, K 5, creatinine 1.07  Allergies (verified):  No Known Drug Allergies  Past Medical History: 1.  Left CEA (10/08).  Most recent carotid dopplers 2/13 with 60-79% RICA stenosis, LICA ok s/p CEA. This is followed at [facility].   2.  HTN 3.  GERD 4.  Hyperlipidemia 5.  Anemia:   She has been on Aranesp for ? anemia of chronic kidney disease though renal function does not seem severely impaired.  She had repeat bone marrow biopsy by Dr. Cleone Slim 9/10. 6.  s/p hysterectomy 7.  Mid-cavity dynamic LV gradient:  Echo (4/11) with EF 65-70%, severe concentric LVH, peak LV mid-cavity gradient reaching 45 mmHg with valsalva, grade I diastolic dysfunction, RV hypertrophy, small pericardial effusion.  Echo (5/12) with mild LVH, EF 65-70%, PASP 40 mmHg.  8.  Adenosine myoview (5/10):  No evidence for ischemia or infarction.  9.  Episodes of syncope and presyncope.  - Patient has Medtronic loop recorder implanted 8/10.  No events so far. - May be due only to orthostasis 10. Cardiac amyloidosis - Severe concentric LVH by echo.   - Cardiac MRI (8/10): EF 66%, mild to moderate concentric LVH, RV size and systolic function normal with no evidence for ARVC, trivial pericardial effusion.  On delayed enhancement, it was difficult to obtain the TI.  There was patchy mid-wall basal delayed enhancement.  There was partially circumferential subendocardial delayed enhancement in the mid to apical LV.  This was suggestive of infiltrative cardiomyopathy versus possible HCM.  - SPEP negative.  UPEP positive for monoclonal lambda light chains.  Monoclonal light chains confirmed on testing by Dr. Cleone Slim.  - CT chest (8/10) to assess for sarcoidosis: no evidence by CT for pulmonary sarcoid.  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  11.  C. difficile colitis  12.  Diverticulitis (1/11) with perforation and colectomy/colostomy.  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  14. Esophageal stricture with dilation 2023/06/14  Family History: Mother died suddenly with presumed MI at 64.  Father died suddenly with presumed MI at age 79.  Cousin died suddenly, ? age.   Social History: Quit  tobacco in 1980.  No ETOH or drugs.  Single, lives in [place], 1 child (daughter in [place]).  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   Review of Systems        All systems reviewed and negative except as per HPI.   Current Outpatient Prescriptions  Medication Sig Dispense Refill  . amitriptyline (ELAVIL) 25 MG tablet Take 50 mg by mouth at bedtime.       Marland Kitchen aspirin EC 81 MG tablet Take 1 tablet (81 mg total) by mouth daily.      Marland Kitchen atorvastatin (LIPITOR) 40 MG tablet TAKE ONE TABLET BY MOUTH ONE TIME DAILY  30 tablet  5  . Cholecalciferol (VITAMIN D) 1000 UNITS capsule Take 1,000 Units by mouth daily.       . folic acid (FOLVITE) 1 MG tablet Take 1 mg by mouth daily.        Marland Kitchen gabapentin (NEURONTIN) 300 MG capsule Take 300 mg by mouth Three times a day.      Marland Kitchen HYDROcodone-acetaminophen (VICODIN) 5-500 MG per tablet Take 1 tablet by mouth every 4 (four) hours as needed. For pain       . lipase/protease/amylase (CREON-10/PANCREASE) 12000 UNITS CPEP Take 1 capsule by mouth 3 (three) times daily. Per Patient she takes 24,000 units      . lisinopril (PRINIVIL,ZESTRIL) 5 MG tablet Take by mouth daily.      Marland Kitchen loperamide (IMODIUM) 2 MG capsule Take 2 mg by mouth 4 (four) times daily as needed. For diarrhea       . megestrol (MEGACE) 40 MG/ML suspension Take 200 mg by mouth 2 (two) times daily before a meal.       . metoCLOPramide (REGLAN) 10 MG tablet Take 10 mg by mouth 2 (two) times daily.        . metoprolol (TOPROL XL) 50 MG 24 hr tablet Take 1 tablet (50 mg total) by mouth daily.  30 tablet  6  . Multiple Vitamin (MULTIVITAMIN) tablet Take 1 tablet by mouth daily.        Marland Kitchen nystatin-triamcinolone ointment (MYCOLOG) Apply topically 2 (two) times daily. Apply to skin under ileostomy twice daily until in addition gone and then on an as-needed basis  60 g  0  . octreotide (SANDOSTATIN) 100 MCG/ML SOLN Inject 150 mcg into the skin every 30 (thirty) days.       . ondansetron (ZOFRAN) 8 MG tablet Take 8 mg by  mouth every 8 (eight) hours as needed. For nausea      . pantoprazole (PROTONIX) 40 MG tablet TAKE 1 TABLET BY MOUTH TWICE DAILY 30 MINUTES BEFORE BREAKFAST AND BEFORE EVENING MEAL  60 tablet  5  . zolpidem (AMBIEN) 10 MG tablet Take 10 mg by mouth at bedtime as needed. For sleep        BP 140/80  Pulse 83  Ht 5\' 3"  (1.6 m)  Wt 107 lb (48.535 kg)  BMI 18.95 kg/m2 General:  Thin, no apparent distress Neck:  Neck supple, no JVD. No masses, thyromegaly or abnormal cervical nodes. Lungs:  Distant BS, no crackles.  Heart:  Non-displaced PMI, chest non-tender; regular rate and rhythm, S1, S2, 2/6 systolic crescendo-decrescendo mid-peaking systolic murmur along sternal border. No S3/S4.  Bilateral carotid bruits. Pedals normal pulses. No edema.  Abdomen:  Bowel sounds positive; abdomen soft and non-tender without masses, organomegaly, or hernias noted. No hepatosplenomegaly.  Extremities:  No clubbing or cyanosis. Neurologic:  Alert and oriented x 3. Psych:  Normal affect.  Assessment/Plan:  CARDIOMYOPATHY, AMYLOID  Patient has a cardiomyopathy with preserved LV systolic function. Delayed enhancement on MRI suggested an infiltrative disease or hypertrophic cardiomyopathy rather than coronary disease, and she has had a negative myoview. 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 This was confirmed by testing at Dr. Bertha Stakes office as well. CT chest was not suggestive of pulmonary sarcoidosis. Given the finding of monoclonal light chains and suggestive MRI findings, she was treated for amyloidosis by Dr. Cleone Slim with Velcade and dexamethasone. Unfortunately, she developed secretory diarrhea and had 2 bowel perforations. Therefore, she is off treatment now. She was recently evaluated at [facility] for treatment of her amyloidosis. - She does not appear significantly volume overloaded today. No Lasix as she does not appear overloaded  and this could worsen her mid-cavity gradient and orthostasis.  - Increase Toprol XL to 75 mg daily as this may help decrease LV mid-cavity gradient (echoes have shown dynamic LV mid-cavity gradient).  - Echocardiogram to reassess LV function and mid-cavity gradient.  - Given orthostatic symptoms and syncope, I asked her to stop lisinopril.  Carotid stenosis Stable carotid disease, followed at [facility].  HYPERLIPIDEMIA-MIXED  Continue statin given known vascular disease (carotid stenosis).  ORTHOSTATIC DIZZINESS I think this is primarily due to dehydration in the setting of secretory diarrhea, probably made worse by LV mid-cavity gradient. As above, stopping lisinopril. Needs to stay hydrated. No arrhythmias on loop recorder => doubt her symptoms are arrhythmia-related.   Marca Ancona 05/18/2012

## 2012-05-23 ENCOUNTER — Other Ambulatory Visit: Payer: Self-pay

## 2012-05-23 ENCOUNTER — Ambulatory Visit (HOSPITAL_COMMUNITY): Payer: Medicare Other | Attending: Cardiology | Admitting: Radiology

## 2012-05-23 DIAGNOSIS — R55 Syncope and collapse: Secondary | ICD-10-CM | POA: Insufficient documentation

## 2012-05-23 DIAGNOSIS — R0602 Shortness of breath: Secondary | ICD-10-CM | POA: Insufficient documentation

## 2012-05-23 DIAGNOSIS — Z9221 Personal history of antineoplastic chemotherapy: Secondary | ICD-10-CM | POA: Insufficient documentation

## 2012-05-23 DIAGNOSIS — E639 Nutritional deficiency, unspecified: Secondary | ICD-10-CM

## 2012-05-23 DIAGNOSIS — E785 Hyperlipidemia, unspecified: Secondary | ICD-10-CM | POA: Insufficient documentation

## 2012-05-23 DIAGNOSIS — I509 Heart failure, unspecified: Secondary | ICD-10-CM | POA: Insufficient documentation

## 2012-05-23 DIAGNOSIS — R0989 Other specified symptoms and signs involving the circulatory and respiratory systems: Secondary | ICD-10-CM | POA: Insufficient documentation

## 2012-05-23 DIAGNOSIS — R0609 Other forms of dyspnea: Secondary | ICD-10-CM | POA: Insufficient documentation

## 2012-05-23 DIAGNOSIS — R011 Cardiac murmur, unspecified: Secondary | ICD-10-CM | POA: Insufficient documentation

## 2012-05-23 DIAGNOSIS — E859 Amyloidosis, unspecified: Secondary | ICD-10-CM | POA: Insufficient documentation

## 2012-05-23 DIAGNOSIS — C801 Malignant (primary) neoplasm, unspecified: Secondary | ICD-10-CM | POA: Insufficient documentation

## 2012-05-23 NOTE — Progress Notes (Signed)
Echocardiogram performed.  

## 2012-05-30 DIAGNOSIS — R197 Diarrhea, unspecified: Secondary | ICD-10-CM

## 2012-05-30 DIAGNOSIS — K8689 Other specified diseases of pancreas: Secondary | ICD-10-CM

## 2012-05-30 DIAGNOSIS — E46 Unspecified protein-calorie malnutrition: Secondary | ICD-10-CM

## 2012-05-30 DIAGNOSIS — E859 Amyloidosis, unspecified: Secondary | ICD-10-CM

## 2012-06-03 ENCOUNTER — Other Ambulatory Visit: Payer: Self-pay | Admitting: Cardiology

## 2012-06-08 ENCOUNTER — Other Ambulatory Visit: Payer: Self-pay | Admitting: Cardiology

## 2012-06-11 NOTE — Patient Instructions (Addendum)
1.  History of amyloidosis. 2.  Treatment:  Finished chemo. 3.  Future plan:  Referral to [facility], Bone Marrow Transplant program, Dr. Carmell Austria to see if bone marrow transplant is appropriate.

## 2012-06-12 ENCOUNTER — Ambulatory Visit (HOSPITAL_BASED_OUTPATIENT_CLINIC_OR_DEPARTMENT_OTHER): Payer: Medicare Other | Admitting: Oncology

## 2012-06-12 ENCOUNTER — Telehealth: Payer: Self-pay | Admitting: Oncology

## 2012-06-12 ENCOUNTER — Other Ambulatory Visit (HOSPITAL_BASED_OUTPATIENT_CLINIC_OR_DEPARTMENT_OTHER): Payer: Medicare Other | Admitting: Lab

## 2012-06-12 VITALS — BP 181/94 | HR 95 | Temp 97.7°F | Resp 20 | Ht 63.0 in | Wt 110.6 lb

## 2012-06-12 DIAGNOSIS — E859 Amyloidosis, unspecified: Secondary | ICD-10-CM

## 2012-06-12 DIAGNOSIS — R197 Diarrhea, unspecified: Secondary | ICD-10-CM

## 2012-06-12 DIAGNOSIS — E46 Unspecified protein-calorie malnutrition: Secondary | ICD-10-CM

## 2012-06-12 DIAGNOSIS — D649 Anemia, unspecified: Secondary | ICD-10-CM

## 2012-06-12 LAB — CBC WITH DIFFERENTIAL/PLATELET
BASO%: 0.7 % (ref 0.0–2.0)
Eosinophils Absolute: 0.1 10*3/uL (ref 0.0–0.5)
HCT: 30.6 % — ABNORMAL LOW (ref 34.8–46.6)
HGB: 10.4 g/dL — ABNORMAL LOW (ref 11.6–15.9)
LYMPH%: 27.8 % (ref 14.0–49.7)
MCHC: 34.2 g/dL (ref 31.5–36.0)
MONO#: 0.3 10*3/uL (ref 0.1–0.9)
NEUT#: 2.3 10*3/uL (ref 1.5–6.5)
NEUT%: 60.3 % (ref 38.4–76.8)
Platelets: 142 10*3/uL — ABNORMAL LOW (ref 145–400)
WBC: 3.9 10*3/uL (ref 3.9–10.3)
lymph#: 1.1 10*3/uL (ref 0.9–3.3)

## 2012-06-12 LAB — COMPREHENSIVE METABOLIC PANEL (CC13)
ALT: 7 U/L (ref 0–55)
AST: 18 U/L (ref 5–34)
Albumin: 4 g/dL (ref 3.5–5.0)
Alkaline Phosphatase: 120 U/L (ref 40–150)
Calcium: 9.6 mg/dL (ref 8.4–10.4)
Chloride: 108 mEq/L — ABNORMAL HIGH (ref 98–107)
Potassium: 4.7 mEq/L (ref 3.5–5.1)
Sodium: 142 mEq/L (ref 136–145)
Total Protein: 7.1 g/dL (ref 6.4–8.3)

## 2012-06-12 NOTE — Telephone Encounter (Signed)
appts made/changed now to 4months per dr Para Skeans sch was printed for pt aom

## 2012-06-12 NOTE — Telephone Encounter (Signed)
appts made and printed for pt aom °

## 2012-06-12 NOTE — Progress Notes (Signed)
[facility]  Telephone:([phone]) [phone] [phone]   OFFICE PROGRESS NOTE   Cc:  Rudi Heap, MD  DIAGNOSIS:  systemic amyloidosis.    PAST THERAPY: underwent treatment with Velcade and dexamethasone.  She had response with decrease in light chain.  However, she developed intractable diarrhea with bowel perforations in 1/11 and 7/11. 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    CURRENT THERAPY:  Watchful observation for myeloma.  She was evaluated at [facility] and was not a candidate for BMT.  She has been on Octreotide for chronic diarrhea.    INTERVAL HISTORY: [name] 71 y.o. female returns for transfer of care from [place] to [place].  She was happy with the care up in [place]; however, Dr. Cleone Slim retired; and all of her care has been down here.  She presented by herself.  She reported that she still has diarrhea about 3-4 times daily with Octreotide injection once a month.  Without this injection, she would have much more such as 8 times a day.  She has been able to eat potatoes and mechanical soft foods.  She also takes Ensure clear.  She does not tolerate solid foods.  She drinks constantly to keep from dehydration.  She has fatigue; however, she still lives by herself and is totally independent of activities of daily living.  She has not been very adherent with her physical therapy exercise at home.  She is active but avoid heavy exertion since she has mild DOE.  She denied mucositis, nausea/vomiting, bleeding, pedal edema, PND, orthopnea.   Past Medical History  Diagnosis Date  . History of CEA (carotid endarterectomy) 03/2007    Left CEA by Dr. Cari Caraway  . Hypertension   . Hyperlipidemia   . GERD (gastroesophageal reflux disease)   . Anemia in chronic kidney disease(285.21) 02/2009    She has been on Aranesp for ? anemia of chronic kidney disease though renal function does not seem severely impaired.  She had repeat bone marrow biopsy by Dr.  Cleone Slim 9/10.  Marland Kitchen Episode of syncope     dysauthonomia/ orthostatic syncope - Patient has Medtronic loop recorder implanted 8/10.  No events so far - May be due only to orthostasis  . 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    . History of MRI 01/2009    CardiacMRI/EF 66%, mild to moderate concentric LVH, RV size & systolic function normAL w/o evidence for ARVC, trivial pericardial effusion/On delayed enhancement, it was difficult to obtain the TI/There was patchy mid-wall basal delayed enhancement/There was partially circumferential subendocardialdelayed enhancement in the mid to apical LV/This was suggestive of infiltrative cardiomyopathy versus  . History of CT scan of chest 01/2009    to assess for sarcoidosis: no evidence by CT for pulmonary sarcoid  . 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   . Secretory diarrhea      treated with octreotide.   . Esophageal stricture 05/2010    with dilation  . Amyloidosis   . Shortness of breath     when tired  . Cancer 2010    Past Surgical History  Procedure Date  . Implantable loop recorder 2010    MDT for unexplained syncope  . Partial colectomy 1/11    for diverticular perforation  . Carotid endarterectomy 10/08    left  .  Abdominal hysterectomy   . Upper gastrointestinal endoscopy 05/20/2010    EGD ED  . Colonoscopy 03/14/2009  . Ercp 04/09/2011    Procedure: ENDOSCOPIC RETROGRADE CHOLANGIOPANCREATOGRAPHY (ERCP);  Surgeon: Malissa Hippo, MD;  Location: [facility];  Service: Endoscopy;  Laterality: N/A;  10:25  . Sphincterotomy 04/09/2011    Procedure: SPHINCTEROTOMY;  Surgeon: Malissa Hippo, MD;  Location: [facility];  Service: Endoscopy;  Laterality: N/A;  with removal of multiple pigment stones    Current Outpatient Prescriptions  Medication Sig Dispense Refill  . amitriptyline (ELAVIL) 25 MG  tablet Take 50 mg by mouth at bedtime.       Marland Kitchen aspirin EC 81 MG tablet Take 1 tablet (81 mg total) by mouth daily.      Marland Kitchen atorvastatin (LIPITOR) 40 MG tablet TAKE ONE TABLET BY MOUTH ONE TIME DAILY  30 tablet  5  . atorvastatin (LIPITOR) 40 MG tablet TAKE ONE TABLET BY MOUTH ONE TIME DAILY  30 tablet  5  . Cholecalciferol (VITAMIN D) 1000 UNITS capsule Take 1,000 Units by mouth daily.       . diphenoxylate-atropine (LOMOTIL) 2.5-0.025 MG per tablet Take 1 tablet by mouth Every 6 hours as needed. Diarrhea      . folic acid (FOLVITE) 1 MG tablet Take 1 mg by mouth daily.        Marland Kitchen gabapentin (NEURONTIN) 300 MG capsule Take 300 mg by mouth Three times a day.      Marland Kitchen HYDROcodone-acetaminophen (VICODIN) 5-500 MG per tablet Take 1 tablet by mouth every 4 (four) hours as needed. For pain       . lipase/protease/amylase (CREON-10/PANCREASE) 12000 UNITS CPEP Take 1 capsule by mouth 3 (three) times daily. Per Patient she takes 24,000 units      . loperamide (IMODIUM) 2 MG capsule Take 2 mg by mouth 4 (four) times daily as needed. For diarrhea       . megestrol (MEGACE) 40 MG/ML suspension Take 200 mg by mouth 2 (two) times daily before a meal.       . metoCLOPramide (REGLAN) 10 MG tablet Take 10 mg by mouth 2 (two) times daily.        . metoprolol succinate (TOPROL XL) 50 MG 24 hr tablet Take 1 and 1/2 tablet daily  45 tablet  4  . Multiple Vitamin (MULTIVITAMIN) tablet Take 1 tablet by mouth daily.        Marland Kitchen nystatin-triamcinolone ointment (MYCOLOG) Apply topically 2 (two) times daily. Apply to skin under ileostomy twice daily until in addition gone and then on an as-needed basis  60 g  0  . octreotide (SANDOSTATIN) 100 MCG/ML SOLN Inject 150 mcg into the skin every 30 (thirty) days.       . ondansetron (ZOFRAN) 8 MG tablet Take 8 mg by mouth every 8 (eight) hours as needed. For nausea      . pantoprazole (PROTONIX) 40 MG tablet TAKE 1 TABLET BY MOUTH TWICE DAILY 30 MINUTES BEFORE BREAKFAST AND  BEFORE EVENING MEAL  60 tablet  5  . zolpidem (AMBIEN) 10 MG tablet Take 10 mg by mouth at bedtime as needed. For sleep        ALLERGIES:   has no known allergies.  REVIEW OF SYSTEMS:  The rest of the 14-point review of system was negative.   Filed Vitals:   06/12/12 0923  BP: 181/94  Pulse: 95  Temp: 97.7 F (36.5 C)  Resp: 20   Wt Readings from Last  3 Encounters:  06/12/12 110 lb 9.6 oz (50.168 kg)  05/17/12 107 lb (48.535 kg)  01/19/12 110 lb 14.4 oz (50.304 kg)   ECOG Performance status: 1-2  PHYSICAL EXAMINATION:   General:  Thin-appearing woman, in no acute distress.  Eyes:  no scleral icterus.  ENT:  There were no oropharyngeal lesions.  Neck was without thyromegaly.  Lymphatics:  Negative cervical, supraclavicular or axillary adenopathy.  Respiratory: lungs were clear bilaterally without wheezing or crackles.  Cardiovascular:  Regular rate and rhythm, S1/S2, without murmur, rub or gallop.  There was no pedal edema.  GI:  abdomen was soft, flat, nontender, nondistended, without organomegaly.  Ostomy in place.  Muscoloskeletal:  no spinal tenderness of palpation of vertebral spine.  Skin exam was without echymosis, petichae.  Neuro exam was nonfocal.  Patient was able to get on and off exam table without assistance.  Gait was normal.  Patient was alerted and oriented.  Attention was good.   Language was appropriate.  Mood was normal without depression.  Speech was not pressured.  Thought content was not tangential.      LABORATORY/RADIOLOGY DATA:  Lab Results  Component Value Date   WBC 3.9 06/12/2012   HGB 10.4* 06/12/2012   HCT 30.6* 06/12/2012   PLT 142* 06/12/2012   GLUCOSE 104* 06/12/2012   CHOL 175 11/23/2011   TRIG 159.0* 11/23/2011   HDL 66.80 11/23/2011   LDLDIRECT 155.9 06/29/2010   LDLCALC 76 11/23/2011   ALKPHOS 120 06/12/2012   ALT 7 06/12/2012   AST 18 06/12/2012   NA 142 06/12/2012   K 4.7 06/12/2012   CL 108* 06/12/2012   CREATININE 1.2* 06/12/2012    BUN 20.0 06/12/2012   CO2 25 06/12/2012   INR 1.18 04/07/2011    ASSESSMENT AND PLAN:   1.  History of amyloidosis:   - Has been in remission.  Seen by Dr. Kathrene Bongo at [facility] and deemed not candidate for BMT given age and co-morbidities.  I am awaiting record from [facility] for confirmation. - I will continue to check her light chain, Mspike every 4 months and occasional 24-hour urine collection to assess her disease.   2.  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  This is a chronic problem.  Without this, she will lose weight and become very mal-nourished.  She has been on long acting octreotide with Dr. Cleone Slim in [place] with some control of the symptoms.  I will resume monthly Octreotide around 06/26/12 allowing [place] time to work with insurance company for preapproval.  3.  Anemia of chronic disease:  Will continue to observe.  If she requires frequent pRBC transfusion, I may consider Aranesp.    4.  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  Will continue to monitor.   5.  Congestive heart failure:  Compensated.  On Toprol XL per Cardiology.   6.  HLP:  ON atorvastatin.  7.  Depression:  On amitryiptyline.   8.  Mild to moderate calorie/protein malnutrition:  She is no Megace, Ensure Clear.  I advised her to increase protein intake.  9.  Deconditioning:  Due to co morbidities (bowel perforations) in 2011.  I advised her to resuming PT exercises that were shown to her before home PT was discontinued.   10.  Follow up:  In about 4 months.    The length of time of the face-to-face encounter was 25 minutes. More than 50% of time was spent counseling and coordination of care.

## 2012-06-15 LAB — PROTEIN ELECTROPHORESIS, SERUM
Alpha-1-Globulin: 4.3 % (ref 2.9–4.9)
Alpha-2-Globulin: 10.7 % (ref 7.1–11.8)
Beta Globulin: 5.3 % (ref 4.7–7.2)
Gamma Globulin: 12.1 % (ref 11.1–18.8)
Total Protein, Serum Electrophoresis: 6.8 g/dL (ref 6.0–8.3)

## 2012-06-15 LAB — KAPPA/LAMBDA LIGHT CHAINS
Kappa free light chain: 1.22 mg/dL (ref 0.33–1.94)
Lambda Free Lght Chn: 7.42 mg/dL — ABNORMAL HIGH (ref 0.57–2.63)

## 2012-06-19 ENCOUNTER — Telehealth: Payer: Self-pay | Admitting: Oncology

## 2012-06-19 NOTE — Telephone Encounter (Signed)
Pt referral to Dr. Carmell Austria. Pt was seen by Dr. Carmell Austria in November 2013.

## 2012-06-23 LAB — UIFE/LIGHT CHAINS/TP QN, 24-HR UR
Free Kappa Lt Chains,Ur: 4.23 mg/dL — ABNORMAL HIGH (ref 0.14–2.42)
Free Kappa/Lambda Ratio: 0.18 ratio — ABNORMAL LOW (ref 2.04–10.37)
Free Lambda Excretion/Day: 212.4 mg/d
Free Lambda Lt Chains,Ur: 23.6 mg/dL — ABNORMAL HIGH (ref 0.02–0.67)
Gamma Globulin, Urine: DETECTED — AB
Time: 24 hours
Total Protein, Urine: 29.2 mg/dL
Volume, Urine: 900 mL

## 2012-06-24 ENCOUNTER — Encounter: Payer: Self-pay | Admitting: Oncology

## 2012-06-24 ENCOUNTER — Other Ambulatory Visit: Payer: Self-pay | Admitting: Oncology

## 2012-06-24 NOTE — Progress Notes (Signed)
Error

## 2012-06-27 ENCOUNTER — Encounter: Payer: Medicare Other | Admitting: Internal Medicine

## 2012-06-27 DIAGNOSIS — E859 Amyloidosis, unspecified: Secondary | ICD-10-CM

## 2012-06-27 DIAGNOSIS — R197 Diarrhea, unspecified: Secondary | ICD-10-CM

## 2012-07-17 ENCOUNTER — Ambulatory Visit (INDEPENDENT_AMBULATORY_CARE_PROVIDER_SITE_OTHER): Payer: Medicare Other | Admitting: Internal Medicine

## 2012-07-17 ENCOUNTER — Encounter: Payer: Self-pay | Admitting: Internal Medicine

## 2012-07-17 ENCOUNTER — Encounter: Payer: Self-pay | Admitting: *Deleted

## 2012-07-17 VITALS — BP 94/55 | HR 77 | Ht 63.0 in | Wt 109.0 lb

## 2012-07-17 DIAGNOSIS — R55 Syncope and collapse: Secondary | ICD-10-CM

## 2012-07-17 DIAGNOSIS — I43 Cardiomyopathy in diseases classified elsewhere: Secondary | ICD-10-CM

## 2012-07-17 DIAGNOSIS — E639 Nutritional deficiency, unspecified: Secondary | ICD-10-CM

## 2012-07-17 NOTE — Patient Instructions (Addendum)
Will schedule to have Loop recorder explanted   See instruction sheet

## 2012-07-17 NOTE — Progress Notes (Signed)
PCP: MOORE, DONALD, MD Primary Cardiologist:  Dr McLean  [name] is a 72 y.o. female who presents today for routine electrophysiology followup.  Since last being seen in our clinic, the patient reports doing very well.  Today, she denies symptoms of palpitations, chest pain, shortness of breath, or lower extremity edema.  She had postural syncope in December.  Her ILR was interrogated and revealed no associated arrhythmias.  The patient is otherwise without complaint today.   Past Medical History  Diagnosis Date  . History of CEA (carotid endarterectomy) 03/2007    Left CEA by Dr. Chris Dickson  . Hypertension   . Hyperlipidemia   . GERD (gastroesophageal reflux disease)   . Anemia in chronic kidney disease(285.21) 02/2009    She has been on Aranesp for ? anemia of chronic kidney disease though renal function does not seem severely impaired.  She had repeat bone marrow biopsy by Dr. Karb 9/10.  . Episode of syncope     dysauthonomia/ orthostatic syncope - Patient has Medtronic loop recorder implanted 8/10.  No events so far - May be due only to orthostasis  . 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    . History of MRI 01/2009    CardiacMRI/EF 66%, mild to moderate concentric LVH, RV size & systolic function normAL w/o evidence for ARVC, trivial pericardial effusion/On delayed enhancement, it was difficult to obtain the TI/There was patchy mid-wall basal delayed enhancement/There was partially circumferential subendocardialdelayed enhancement in the mid to apical LV/This was suggestive of infiltrative cardiomyopathy versus  . History of CT scan of chest 01/2009    to assess for sarcoidosis: no evidence by CT for pulmonary sarcoid  . 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   . Secretory diarrhea      treated with octreotide.   . Esophageal  stricture 05/2010    with dilation  . Amyloidosis   . Shortness of breath     when tired  . Cancer 2010   Past Surgical History  Procedure Date  . Implantable loop recorder 2010    MDT for unexplained syncope  . Partial colectomy 1/11    for diverticular perforation  . Carotid endarterectomy 10/08    left  . Abdominal hysterectomy   . Upper gastrointestinal endoscopy 05/20/2010    EGD ED  . Colonoscopy 03/14/2009  . Ercp 04/09/2011    Procedure: ENDOSCOPIC RETROGRADE CHOLANGIOPANCREATOGRAPHY (ERCP);  Surgeon: Najeeb U Rehman, MD;  Location: [facility];  Service: Endoscopy;  Laterality: N/A;  10:25  . Sphincterotomy 04/09/2011    Procedure: SPHINCTEROTOMY;  Surgeon: Najeeb U Rehman, MD;  Location: [facility];  Service: Endoscopy;  Laterality: N/A;  with removal of multiple pigment stones    Current Outpatient Prescriptions  Medication Sig Dispense Refill  . amitriptyline (ELAVIL) 25 MG tablet Take 50 mg by mouth at bedtime.       . aspirin EC 81 MG tablet Take 1 tablet (81 mg total) by mouth daily.      . atorvastatin (LIPITOR) 40 MG tablet TAKE ONE TABLET BY MOUTH ONE TIME DAILY  30 tablet  5  . atorvastatin (LIPITOR) 40 MG tablet TAKE ONE TABLET BY MOUTH ONE TIME DAILY  30 tablet  5  . Cholecalciferol (VITAMIN D) 1000 UNITS capsule Take 1,000 Units by mouth daily.       . diphenoxylate-atropine (LOMOTIL)   2.5-0.025 MG per tablet Take 1 tablet by mouth Every 6 hours as needed. Diarrhea      . folic acid (FOLVITE) 1 MG tablet Take 1 mg by mouth daily.        . gabapentin (NEURONTIN) 300 MG capsule Take 300 mg by mouth Three times a day.      . HYDROcodone-acetaminophen (VICODIN) 5-500 MG per tablet Take 1 tablet by mouth every 4 (four) hours as needed. For pain       . lipase/protease/amylase (CREON-10/PANCREASE) 12000 UNITS CPEP Take 1 capsule by mouth 3 (three) times daily. Per Patient she takes 24,000 units      . loperamide (IMODIUM) 2 MG capsule Take 2 mg by mouth 4 (four) times daily  as needed. For diarrhea       . megestrol (MEGACE) 40 MG/ML suspension Take 200 mg by mouth 2 (two) times daily before a meal.       . metoCLOPramide (REGLAN) 10 MG tablet Take 10 mg by mouth 2 (two) times daily.        . metoprolol succinate (TOPROL XL) 50 MG 24 hr tablet Take 1 and 1/2 tablet daily  45 tablet  4  . Multiple Vitamin (MULTIVITAMIN) tablet Take 1 tablet by mouth daily.        . nystatin-triamcinolone ointment (MYCOLOG) Apply topically 2 (two) times daily. Apply to skin under ileostomy twice daily until in addition gone and then on an as-needed basis  60 g  0  . octreotide (SANDOSTATIN) 100 MCG/ML SOLN Inject 150 mcg into the skin every 30 (thirty) days.       . ondansetron (ZOFRAN) 8 MG tablet Take 8 mg by mouth every 8 (eight) hours as needed. For nausea      . pantoprazole (PROTONIX) 40 MG tablet TAKE 1 TABLET BY MOUTH TWICE DAILY 30 MINUTES BEFORE BREAKFAST AND 30MINUTES BEFORE EVENING MEAL  60 tablet  5  . zolpidem (AMBIEN) 10 MG tablet Take 10 mg by mouth at bedtime as needed. For sleep        Physical Exam: Filed Vitals:   07/17/12 1049  BP: 94/55  Pulse: 77  Height: 5' 3" (1.6 m)  Weight: 109 lb (49.442 kg)    GEN- The patient is chronically ill appearing, alert and oriented x 3 today.   Head- normocephalic, atraumatic Eyes-  Sclera clear, conjunctiva pink Ears- hearing intact Oropharynx- clear Lungs- Clear to ausculation bilaterally, normal work of breathing Chest- ILR pocket is well healed Heart- Regular rate and rhythm, 2/6 SEM GI- soft, NT, ND, + BS Extremities- no clubbing, cyanosis, or edema  ILR interrogation- reviewed in detail today,  See PACEART report  Assessment and Plan:  1. Syncope- likely orthostatic in nature ILR reveals no arrhythmias The device has reached end of life.  Risks, benefits, and alternatives to ILR removal were discussed at length with the patient today who is willing to proceed.  We will therefore proceed with device  removal.  2. Amyloidosis Stable Continue to follow with Dr McLean  

## 2012-07-18 ENCOUNTER — Other Ambulatory Visit: Payer: Self-pay | Admitting: *Deleted

## 2012-07-18 DIAGNOSIS — R55 Syncope and collapse: Secondary | ICD-10-CM

## 2012-07-20 ENCOUNTER — Encounter (HOSPITAL_COMMUNITY): Payer: Self-pay | Admitting: Pharmacy Technician

## 2012-07-24 ENCOUNTER — Ambulatory Visit: Payer: Medicare Other | Admitting: Oncology

## 2012-07-25 ENCOUNTER — Other Ambulatory Visit (INDEPENDENT_AMBULATORY_CARE_PROVIDER_SITE_OTHER): Payer: Medicare Other

## 2012-07-25 DIAGNOSIS — E859 Amyloidosis, unspecified: Secondary | ICD-10-CM

## 2012-07-25 DIAGNOSIS — R55 Syncope and collapse: Secondary | ICD-10-CM

## 2012-07-25 DIAGNOSIS — E639 Nutritional deficiency, unspecified: Secondary | ICD-10-CM

## 2012-07-25 LAB — CBC WITH DIFFERENTIAL/PLATELET
Basophils Relative: 0.4 % (ref 0.0–3.0)
Eosinophils Absolute: 0.1 10*3/uL (ref 0.0–0.7)
Lymphocytes Relative: 26.2 % (ref 12.0–46.0)
MCHC: 33.6 g/dL (ref 30.0–36.0)
Monocytes Relative: 7.3 % (ref 3.0–12.0)
Neutrophils Relative %: 64.4 % (ref 43.0–77.0)
RBC: 3.67 Mil/uL — ABNORMAL LOW (ref 3.87–5.11)
WBC: 3.9 10*3/uL — ABNORMAL LOW (ref 4.5–10.5)

## 2012-07-25 LAB — BASIC METABOLIC PANEL
BUN: 17 mg/dL (ref 6–23)
CO2: 25 mEq/L (ref 19–32)
Chloride: 107 mEq/L (ref 96–112)
Potassium: 4.2 mEq/L (ref 3.5–5.1)

## 2012-07-26 ENCOUNTER — Other Ambulatory Visit: Payer: Medicare Other

## 2012-07-26 ENCOUNTER — Ambulatory Visit: Payer: Medicare Other | Admitting: Neurosurgery

## 2012-07-31 MED ORDER — SODIUM CHLORIDE 0.9 % IJ SOLN: 3.0000 mL | INTRAMUSCULAR | Status: DC | PRN

## 2012-07-31 MED ORDER — CEFAZOLIN SODIUM-DEXTROSE 2-3 GM-% IV SOLR
2.0000 g | INTRAVENOUS | Status: DC
  Filled 2012-07-31 (×2): qty 50

## 2012-07-31 MED ORDER — SODIUM CHLORIDE 0.9 % IR SOLN
80.0000 mg | Status: DC
  Filled 2012-07-31: qty 2

## 2012-07-31 MED ORDER — SODIUM CHLORIDE 0.9 % IV SOLN: 250.0000 mL | INTRAVENOUS | Status: DC

## 2012-07-31 MED ORDER — SODIUM CHLORIDE 0.9 % IJ SOLN: 3.0000 mL | Freq: Two times a day (BID) | INTRAMUSCULAR | Status: DC

## 2012-07-31 MED ORDER — SODIUM CHLORIDE 0.45 % IV SOLN
INTRAVENOUS | Status: DC
  Administered 2012-08-01: 50 mL/h via INTRAVENOUS

## 2012-07-31 MED ORDER — CHLORHEXIDINE GLUCONATE 4 % EX LIQD
60.0000 mL | Freq: Once | CUTANEOUS | Status: DC
  Filled 2012-07-31: qty 60

## 2012-08-01 ENCOUNTER — Encounter (HOSPITAL_COMMUNITY)
Admission: RE | Disposition: A | Discharge status: Home / Self Care | Payer: Self-pay | Source: Ambulatory Visit | Attending: Internal Medicine

## 2012-08-01 ENCOUNTER — Ambulatory Visit (HOSPITAL_COMMUNITY)
Admission: RE | Admit: 2012-08-01 | Discharge: 2012-08-01 | Disposition: A | Discharge status: Home / Self Care | Payer: Medicare Other | Source: Ambulatory Visit | Attending: Internal Medicine | Admitting: Internal Medicine

## 2012-08-01 DIAGNOSIS — Z4509 Encounter for adjustment and management of other cardiac device: Secondary | ICD-10-CM | POA: Insufficient documentation

## 2012-08-01 DIAGNOSIS — R42 Dizziness and giddiness: Secondary | ICD-10-CM | POA: Diagnosis present

## 2012-08-01 DIAGNOSIS — R55 Syncope and collapse: Secondary | ICD-10-CM

## 2012-08-01 DIAGNOSIS — I10 Essential (primary) hypertension: Secondary | ICD-10-CM | POA: Insufficient documentation

## 2012-08-01 HISTORY — PX: LOOP RECORDER EXPLANT: SHX5476

## 2012-08-01 HISTORY — PX: TIBIAL TUBERCLERPLASTY: SHX5953

## 2012-08-01 LAB — SURGICAL PCR SCREEN
MRSA, PCR: NEGATIVE
Staphylococcus aureus: NEGATIVE

## 2012-08-01 SURGERY — LOOP RECORDER EXPLANT
Anesthesia: LOCAL

## 2012-08-01 MED ORDER — LISINOPRIL 5 MG PO TABS
5.0000 mg | ORAL_TABLET | ORAL | Status: AC
  Administered 2012-08-01: 5 mg via ORAL
  Filled 2012-08-01: qty 1

## 2012-08-01 MED ORDER — METOPROLOL SUCCINATE ER 50 MG PO TB24
50.0000 mg | ORAL_TABLET | Freq: Once | ORAL | Status: AC
  Administered 2012-08-01: 50 mg via ORAL

## 2012-08-01 MED ORDER — METOPROLOL SUCCINATE ER 50 MG PO TB24
50.0000 mg | ORAL_TABLET | ORAL | Status: AC
  Administered 2012-08-01: 50 mg via ORAL
  Filled 2012-08-01: qty 1

## 2012-08-01 MED ORDER — MUPIROCIN 2 % EX OINT
TOPICAL_OINTMENT | CUTANEOUS | Status: AC
  Filled 2012-08-01: qty 22

## 2012-08-01 MED ORDER — HYDROCODONE-ACETAMINOPHEN 5-325 MG PO TABS
1.0000 | ORAL_TABLET | ORAL | Status: DC | PRN
  Administered 2012-08-01: 1 via ORAL

## 2012-08-01 MED ORDER — HYDROCODONE-ACETAMINOPHEN 5-325 MG PO TABS
ORAL_TABLET | ORAL | Status: AC
  Filled 2012-08-01: qty 1

## 2012-08-01 MED ORDER — LIDOCAINE HCL (PF) 1 % IJ SOLN
INTRAMUSCULAR | Status: AC
  Filled 2012-08-01: qty 60

## 2012-08-01 MED ORDER — LISINOPRIL 5 MG PO TABS
5.0000 mg | ORAL_TABLET | Freq: Once | ORAL | Status: AC
  Administered 2012-08-01: 5 mg via ORAL

## 2012-08-01 NOTE — Interval H&P Note (Signed)
History and Physical Interval Note:  08/01/2012 10:53 AM  [name]  has presented today for surgery, with the diagnosis of Syncope  The various methods of treatment have been discussed with the patient and family. After consideration of risks, benefits and other options for treatment, the patient has consented to  Procedure(s): LOOP RECORDER EXPLANT (N/A) as a surgical intervention .  The patient's history has been reviewed, patient examined, no change in status, stable for surgery.  I have reviewed the patient's chart and labs.  Questions were answered to the patient's satisfaction.     Hillis Range

## 2012-08-01 NOTE — Op Note (Signed)
SURGEON:  Hillis Range, MD     PREPROCEDURE DIAGNOSIS:  Syncope, implantable loop recorder at end of life    POSTPROCEDURE DIAGNOSIS:  Syncope, implantable loop recorder at end of life     PROCEDURES:   1. Implantable loop recorder removal    INTRODUCTION:  [name] is a 72 y.o. female with a history of recurrent unexplained syncope who presents today for implantable loop removal.  She had ILR implanted in 2010. Her syncope has recurrent but without any arrhythmias documented and is felt to be due to orthostatic/ neurocardiogenic causes.  Her device has reached end of life.   The patient therefore presents today for implantable loop removal.     DESCRIPTION OF PROCEDURE:  Informed written consent was obtained, and the patient was brought to the electrophysiology lab in a fasting state.  The patient required no sedation for the procedure today.  The patients left chest was therefore prepped and draped in the usual sterile fashion by the EP lab staff. The skin overlying the existing loop monitor was infiltrated with lidocaine for local analgesia.  A 1.5-cm incision was made over the left parasternal region over the 3rd intercostal space. Electrocautery was required to assure hemostasis.  The Medtronic Reveal XT monitor was exposed.  2 separate silk sutures were removed in their entirety.  The device was then removed from the body.  The pocket was irrigated with copious gentamicin solution.   The pocket was then closed in 2 layers with 2.0 Vicryl suture for the subcutaneous and subcuticular layers.  Steri- Strips and a sterile dressing were then applied.  There were no early apparent complications.     CONCLUSIONS:   1. Successful explantation of a Medtronic Reveal XT implantable loop recorder   2. No early apparent complications.        Hillis Range, MD 08/01/2012 11:50 AM

## 2012-08-01 NOTE — H&P (View-Only) (Signed)
PCP: Rudi Heap, MD Primary Cardiologist:  Dr Lynnae Sandhoff is a 72 y.o. female who presents today for routine electrophysiology followup.  Since last being seen in our clinic, the patient reports doing very well.  Today, she denies symptoms of palpitations, chest pain, shortness of breath, or lower extremity edema.  She had postural syncope in December.  Her ILR was interrogated and revealed no associated arrhythmias.  The patient is otherwise without complaint today.   Past Medical History  Diagnosis Date  . History of CEA (carotid endarterectomy) 03/2007    Left CEA by Dr. Cari Caraway  . Hypertension   . Hyperlipidemia   . GERD (gastroesophageal reflux disease)   . Anemia in chronic kidney disease(285.21) 02/2009    She has been on Aranesp for ? anemia of chronic kidney disease though renal function does not seem severely impaired.  She had repeat bone marrow biopsy by Dr. Cleone Slim 9/10.  Marland Kitchen Episode of syncope     dysauthonomia/ orthostatic syncope - Patient has Medtronic loop recorder implanted 8/10.  No events so far - May be due only to orthostasis  . 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    . History of MRI 01/2009    CardiacMRI/EF 66%, mild to moderate concentric LVH, RV size & systolic function normAL w/o evidence for ARVC, trivial pericardial effusion/On delayed enhancement, it was difficult to obtain the TI/There was patchy mid-wall basal delayed enhancement/There was partially circumferential subendocardialdelayed enhancement in the mid to apical LV/This was suggestive of infiltrative cardiomyopathy versus  . History of CT scan of chest 01/2009    to assess for sarcoidosis: no evidence by CT for pulmonary sarcoid  . 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   . Secretory diarrhea      treated with octreotide.   . Esophageal  stricture 05/2010    with dilation  . Amyloidosis   . Shortness of breath     when tired  . Cancer 2010   Past Surgical History  Procedure Date  . Implantable loop recorder 2010    MDT for unexplained syncope  . Partial colectomy 1/11    for diverticular perforation  . Carotid endarterectomy 10/08    left  . Abdominal hysterectomy   . Upper gastrointestinal endoscopy 05/20/2010    EGD ED  . Colonoscopy 03/14/2009  . Ercp 04/09/2011    Procedure: ENDOSCOPIC RETROGRADE CHOLANGIOPANCREATOGRAPHY (ERCP);  Surgeon: Malissa Hippo, MD;  Location: [facility];  Service: Endoscopy;  Laterality: N/A;  10:25  . Sphincterotomy 04/09/2011    Procedure: SPHINCTEROTOMY;  Surgeon: Malissa Hippo, MD;  Location: [facility];  Service: Endoscopy;  Laterality: N/A;  with removal of multiple pigment stones    Current Outpatient Prescriptions  Medication Sig Dispense Refill  . amitriptyline (ELAVIL) 25 MG tablet Take 50 mg by mouth at bedtime.       Marland Kitchen aspirin EC 81 MG tablet Take 1 tablet (81 mg total) by mouth daily.      Marland Kitchen atorvastatin (LIPITOR) 40 MG tablet TAKE ONE TABLET BY MOUTH ONE TIME DAILY  30 tablet  5  . atorvastatin (LIPITOR) 40 MG tablet TAKE ONE TABLET BY MOUTH ONE TIME DAILY  30 tablet  5  . Cholecalciferol (VITAMIN D) 1000 UNITS capsule Take 1,000 Units by mouth daily.       . diphenoxylate-atropine (LOMOTIL)  2.5-0.025 MG per tablet Take 1 tablet by mouth Every 6 hours as needed. Diarrhea      . folic acid (FOLVITE) 1 MG tablet Take 1 mg by mouth daily.        Marland Kitchen gabapentin (NEURONTIN) 300 MG capsule Take 300 mg by mouth Three times a day.      Marland Kitchen HYDROcodone-acetaminophen (VICODIN) 5-500 MG per tablet Take 1 tablet by mouth every 4 (four) hours as needed. For pain       . lipase/protease/amylase (CREON-10/PANCREASE) 12000 UNITS CPEP Take 1 capsule by mouth 3 (three) times daily. Per Patient she takes 24,000 units      . loperamide (IMODIUM) 2 MG capsule Take 2 mg by mouth 4 (four) times daily  as needed. For diarrhea       . megestrol (MEGACE) 40 MG/ML suspension Take 200 mg by mouth 2 (two) times daily before a meal.       . metoCLOPramide (REGLAN) 10 MG tablet Take 10 mg by mouth 2 (two) times daily.        . metoprolol succinate (TOPROL XL) 50 MG 24 hr tablet Take 1 and 1/2 tablet daily  45 tablet  4  . Multiple Vitamin (MULTIVITAMIN) tablet Take 1 tablet by mouth daily.        Marland Kitchen nystatin-triamcinolone ointment (MYCOLOG) Apply topically 2 (two) times daily. Apply to skin under ileostomy twice daily until in addition gone and then on an as-needed basis  60 g  0  . octreotide (SANDOSTATIN) 100 MCG/ML SOLN Inject 150 mcg into the skin every 30 (thirty) days.       . ondansetron (ZOFRAN) 8 MG tablet Take 8 mg by mouth every 8 (eight) hours as needed. For nausea      . pantoprazole (PROTONIX) 40 MG tablet TAKE 1 TABLET BY MOUTH TWICE DAILY 30 MINUTES BEFORE BREAKFAST AND BEFORE EVENING MEAL  60 tablet  5  . zolpidem (AMBIEN) 10 MG tablet Take 10 mg by mouth at bedtime as needed. For sleep        Physical Exam: Filed Vitals:   07/17/12 1049  BP: 94/55  Pulse: 77  Height: 5\' 3"  (1.6 m)  Weight: 109 lb (49.442 kg)    GEN- The patient is chronically ill appearing, alert and oriented x 3 today.   Head- normocephalic, atraumatic Eyes-  Sclera clear, conjunctiva pink Ears- hearing intact Oropharynx- clear Lungs- Clear to ausculation bilaterally, normal work of breathing Chest- ILR pocket is well healed Heart- Regular rate and rhythm, 2/6 SEM GI- soft, NT, ND, + BS Extremities- no clubbing, cyanosis, or edema  ILR interrogation- reviewed in detail today,  See PACEART report  Assessment and Plan:  1. Syncope- likely orthostatic in nature ILR reveals no arrhythmias The device has reached end of life.  Risks, benefits, and alternatives to ILR removal were discussed at length with the patient today who is willing to proceed.  We will therefore proceed with device  removal.  2. Amyloidosis Stable Continue to follow with Dr Shirlee Latch

## 2012-08-09 ENCOUNTER — Encounter: Payer: Self-pay | Admitting: Neurosurgery

## 2012-08-10 ENCOUNTER — Other Ambulatory Visit (INDEPENDENT_AMBULATORY_CARE_PROVIDER_SITE_OTHER): Payer: Medicare Other | Admitting: *Deleted

## 2012-08-10 ENCOUNTER — Encounter: Payer: Self-pay | Admitting: Neurosurgery

## 2012-08-10 ENCOUNTER — Ambulatory Visit (INDEPENDENT_AMBULATORY_CARE_PROVIDER_SITE_OTHER): Payer: Medicare Other | Admitting: Neurosurgery

## 2012-08-10 ENCOUNTER — Other Ambulatory Visit: Payer: Self-pay | Admitting: *Deleted

## 2012-08-10 DIAGNOSIS — Z48812 Encounter for surgical aftercare following surgery on the circulatory system: Secondary | ICD-10-CM

## 2012-08-10 DIAGNOSIS — I6529 Occlusion and stenosis of unspecified carotid artery: Secondary | ICD-10-CM

## 2012-08-10 NOTE — Progress Notes (Signed)
[facility] Carotid Office Note  CC: Carotid surveillance Referring Physician: Edilia Bo  History of Present Illness: 72 year old female patient of Dr. Edilia Bo status post left CEA in October 2008. Patient denies any signs or symptoms of CVA, TIA, amaurosis fugax. The patient does have some residual neural deficit from previous difficulties but no acute episodes.  Past Medical History  Diagnosis Date  . History of CEA (carotid endarterectomy) 03/2007    Left CEA by Dr. Cari Caraway  . Hypertension   . Hyperlipidemia   . GERD (gastroesophageal reflux disease)   . Anemia in chronic kidney disease(285.21) 02/2009    She has been on Aranesp for ? anemia of chronic kidney disease though renal function does not seem severely impaired.  She had repeat bone marrow biopsy by Dr. Cleone Slim 9/10.  Marland Kitchen Episode of syncope     dysauthonomia/ orthostatic syncope - Patient has Medtronic loop recorder implanted 8/10.  No events so far - May be due only to orthostasis  . 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    . History of MRI 01/2009    CardiacMRI/EF 66%, mild to moderate concentric LVH, RV size & systolic function normAL w/o evidence for ARVC, trivial pericardial effusion/On delayed enhancement, it was difficult to obtain the TI/There was patchy mid-wall basal delayed enhancement/There was partially circumferential subendocardialdelayed enhancement in the mid to apical LV/This was suggestive of infiltrative cardiomyopathy versus  . History of CT scan of chest 01/2009    to assess for sarcoidosis: no evidence by CT for pulmonary sarcoid  . 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   . Secretory diarrhea      treated with octreotide.   . Esophageal stricture 05/2010    with dilation  . Amyloidosis   . Shortness of breath     when tired  .  Cancer 2010    Cancer of the Blood stream    ROS: [x]  Positive   [ ]  Denies    General: [ ]  Weight loss, [ ]  Fever, [ ]  chills Neurologic: [ x] Dizziness, [ ]  Blackouts, [ ]  Seizure [ ]  Stroke, [ ]  "Mini stroke", [ ]  Slurred speech, [ ]  Temporary blindness; [x ] weakness in arms or legs, [ ]  Hoarseness Cardiac: [ ]  Chest pain/pressure, [x]  Shortness of breath at rest [x ] Shortness of breath with exertion, [ ]  Atrial fibrillation or irregular heartbeat Vascular: [ ]  Pain in legs with walking, [ ]  Pain in legs at rest, [ ]  Pain in legs at night,  [ ]  Non-healing ulcer, [ ]  Blood clot in vein/DVT,   Pulmonary: [ ]  Home oxygen, [ ]  Productive cough, [ ]  Coughing up blood, [ ]  Asthma,  [ ]  Wheezing Musculoskeletal:  [ ]  Arthritis, [ ]  Low back pain, [ ]  Joint pain Hematologic: [ ]  Easy Bruising, [ ]  Anemia; [ ]  Hepatitis Gastrointestinal: [ ]  Blood in stool, [ ]  Gastroesophageal Reflux/heartburn, [ ]  Trouble swallowing Urinary: [ ]  chronic Kidney disease, [ ]  on HD - [ ]  MWF or [ ]  TTHS, [ ]  Burning with urination, [ ]  Difficulty urinating Skin: [ ]  Rashes, [ ]  Wounds Psychological: [ ]  Anxiety, [ ]  Depression   Social History History  Substance Use Topics  . Smoking status: Former Smoker -- 1.00 packs/day for 5 years    Types: Cigarettes  Quit date: 09/07/1978  . Smokeless tobacco: Never Used  . Alcohol Use: No    Family History Family History  Problem Relation Age of Onset  . Heart failure Mother   . Hypertension Mother   . Hyperlipidemia Mother   . Heart attack Mother   . Heart failure Father   . Anesthesia problems Neg Hx   . Hypotension Neg Hx   . Malignant hyperthermia Neg Hx   . Pseudochol deficiency Neg Hx     No Known Allergies  Current Outpatient Prescriptions  Medication Sig Dispense Refill  . amitriptyline (ELAVIL) 25 MG tablet Take 50 mg by mouth at bedtime.       Marland Kitchen aspirin EC 81 MG tablet Take 1 tablet (81 mg total) by mouth daily.      Marland Kitchen atorvastatin  (LIPITOR) 40 MG tablet Take 40 mg by mouth daily.      . Calcium Carbonate-Vit D-Min 1200-1000 MG-UNIT CHEW Chew 1 tablet by mouth 2 (two) times daily.      . diphenoxylate-atropine (LOMOTIL) 2.5-0.025 MG per tablet Take 1 tablet by mouth every 6 (six) hours as needed. Diarrhea      . folic acid (FOLVITE) 1 MG tablet Take 1 mg by mouth daily.        Marland Kitchen gabapentin (NEURONTIN) 300 MG capsule Take 300 mg by mouth 3 (three) times daily.       Marland Kitchen HYDROcodone-acetaminophen (NORCO/VICODIN) 5-325 MG per tablet Take 1 tablet by mouth every 6 (six) hours as needed. For pain      . lipase/protease/amylase (CREON-10/PANCREASE) 12000 UNITS CPEP Take 1 capsule by mouth 3 (three) times daily. Per Patient she takes 24,000 units      . lisinopril (PRINIVIL,ZESTRIL) 5 MG tablet Take 5 mg by mouth daily.      Marland Kitchen loperamide (IMODIUM) 2 MG capsule Take 2 mg by mouth 4 (four) times daily as needed. For diarrhea       . megestrol (MEGACE) 40 MG/ML suspension Take 200 mg by mouth 2 (two) times daily as needed. For appetite control      . metoCLOPramide (REGLAN) 10 MG tablet Take 10 mg by mouth 2 (two) times daily.        . metoprolol succinate (TOPROL-XL) 50 MG 24 hr tablet Take 50 mg by mouth daily. Take with or immediately following a meal.      . Multiple Vitamin (MULTIVITAMIN) tablet Take 1 tablet by mouth daily.        Marland Kitchen nystatin-triamcinolone ointment (MYCOLOG) Apply 1 application topically 2 (two) times daily as needed. Apply to ileostomy twice daily as needed until irritation is gone      . octreotide (SANDOSTATIN) 100 MCG/ML SOLN Inject 150 mcg into the skin every 30 (thirty) days.       . ondansetron (ZOFRAN) 8 MG tablet Take 8 mg by mouth every 8 (eight) hours as needed. For nausea      . pantoprazole (PROTONIX) 40 MG tablet Take 40 mg by mouth 2 (two) times daily before a meal.      . zolpidem (AMBIEN) 10 MG tablet Take 5 mg by mouth at bedtime as needed. For sleep       No current facility-administered  medications for this visit.    Physical Examination  Filed Vitals:   08/10/12 1036  BP: 129/78  Pulse: 81  Resp:     Body mass index is 19.31 kg/(m^2).  General:  WDWN in NAD Gait: Normal HEENT: WNL Eyes: Pupils equal Pulmonary:  normal non-labored breathing , without Rales, rhonchi,  wheezing Cardiac: RRR, without  Murmurs, rubs or gallops; Abdomen: soft, NT, no masses Skin: no rashes, ulcers noted  Vascular Exam Pulses: 2+ radial pulses bilaterally Carotid bruits: Carotid pulses to auscultation no bruits are heard Extremities without ischemic changes, no Gangrene , no cellulitis; no open wounds;  Musculoskeletal: no muscle wasting or atrophy   Neurologic: A&O X 3; Appropriate Affect ; SENSATION: normal; MOTOR FUNCTION:  moving all extremities equally. Speech is fluent/normal  Non-Invasive Vascular Imaging CAROTID DUPLEX 08/10/2012  Right ICA 40 - 59 % stenosis Left ICA 0 - 19% stenosis   ASSESSMENT/PLAN: Asymptomatic patient with stable duplex from prior exam. The patient will followup in one year with repeat carotid duplex. The patient's questions were encouraged and answered, she is in agreement with this plan.  Lauree Chandler ANP   Clinic MD: Darrick Penna

## 2012-08-14 ENCOUNTER — Inpatient Hospital Stay (HOSPITAL_COMMUNITY)
Admission: EM | Admit: 2012-08-14 | Discharge: 2012-08-16 | DRG: 392 | Disposition: A | Discharge status: Home / Self Care | Payer: Medicare Other | Attending: Internal Medicine | Admitting: Internal Medicine

## 2012-08-14 DIAGNOSIS — Z9049 Acquired absence of other specified parts of digestive tract: Secondary | ICD-10-CM

## 2012-08-14 DIAGNOSIS — Z79899 Other long term (current) drug therapy: Secondary | ICD-10-CM

## 2012-08-14 DIAGNOSIS — I428 Other cardiomyopathies: Secondary | ICD-10-CM | POA: Diagnosis present

## 2012-08-14 DIAGNOSIS — E872 Acidosis, unspecified: Secondary | ICD-10-CM | POA: Diagnosis present

## 2012-08-14 DIAGNOSIS — E860 Dehydration: Secondary | ICD-10-CM | POA: Diagnosis present

## 2012-08-14 DIAGNOSIS — I10 Essential (primary) hypertension: Secondary | ICD-10-CM | POA: Diagnosis present

## 2012-08-14 DIAGNOSIS — R609 Edema, unspecified: Secondary | ICD-10-CM

## 2012-08-14 DIAGNOSIS — R011 Cardiac murmur, unspecified: Secondary | ICD-10-CM

## 2012-08-14 DIAGNOSIS — I6529 Occlusion and stenosis of unspecified carotid artery: Secondary | ICD-10-CM

## 2012-08-14 DIAGNOSIS — N179 Acute kidney failure, unspecified: Secondary | ICD-10-CM | POA: Diagnosis present

## 2012-08-14 DIAGNOSIS — I959 Hypotension, unspecified: Secondary | ICD-10-CM | POA: Diagnosis present

## 2012-08-14 DIAGNOSIS — R112 Nausea with vomiting, unspecified: Principal | ICD-10-CM | POA: Diagnosis present

## 2012-08-14 DIAGNOSIS — E639 Nutritional deficiency, unspecified: Secondary | ICD-10-CM

## 2012-08-14 DIAGNOSIS — K6389 Other specified diseases of intestine: Secondary | ICD-10-CM

## 2012-08-14 DIAGNOSIS — K219 Gastro-esophageal reflux disease without esophagitis: Secondary | ICD-10-CM | POA: Diagnosis present

## 2012-08-14 DIAGNOSIS — R55 Syncope and collapse: Secondary | ICD-10-CM

## 2012-08-14 DIAGNOSIS — R42 Dizziness and giddiness: Secondary | ICD-10-CM

## 2012-08-14 DIAGNOSIS — R0602 Shortness of breath: Secondary | ICD-10-CM

## 2012-08-14 DIAGNOSIS — R197 Diarrhea, unspecified: Secondary | ICD-10-CM | POA: Diagnosis present

## 2012-08-14 DIAGNOSIS — R072 Precordial pain: Secondary | ICD-10-CM

## 2012-08-14 DIAGNOSIS — E785 Hyperlipidemia, unspecified: Secondary | ICD-10-CM | POA: Diagnosis present

## 2012-08-14 DIAGNOSIS — K529 Noninfective gastroenteritis and colitis, unspecified: Secondary | ICD-10-CM | POA: Diagnosis present

## 2012-08-14 DIAGNOSIS — E875 Hyperkalemia: Secondary | ICD-10-CM | POA: Diagnosis present

## 2012-08-14 DIAGNOSIS — D61818 Other pancytopenia: Secondary | ICD-10-CM | POA: Diagnosis present

## 2012-08-14 DIAGNOSIS — Z8719 Personal history of other diseases of the digestive system: Secondary | ICD-10-CM

## 2012-08-14 DIAGNOSIS — E782 Mixed hyperlipidemia: Secondary | ICD-10-CM | POA: Diagnosis present

## 2012-08-14 DIAGNOSIS — E859 Amyloidosis, unspecified: Secondary | ICD-10-CM

## 2012-08-14 MED ORDER — SODIUM CHLORIDE 0.9 % IV BOLUS (SEPSIS)
1000.0000 mL | Freq: Once | INTRAVENOUS | Status: AC
  Administered 2012-08-15: 1000 mL via INTRAVENOUS

## 2012-08-14 MED ORDER — ONDANSETRON HCL 4 MG/2ML IJ SOLN
4.0000 mg | Freq: Once | INTRAMUSCULAR | Status: AC
  Administered 2012-08-15: 4 mg via INTRAVENOUS
  Filled 2012-08-14: qty 2

## 2012-08-14 NOTE — ED Notes (Addendum)
Pt woke up late and felt nauseous. Pt has colostomy and ileostomy. Pt reports  Runny stool and states her bag is filling up in 20 min . Pt drank ginger ale and felt better  But nausea came back.  Pt is a Cancer pt but not having radiation or chemo at his time.

## 2012-08-14 NOTE — ED Notes (Signed)
AVW:[id number]<[id number]> Expected date:08/14/12<BR> Expected time:11:05 PM<BR> Means of arrival:Ambulance<BR> Comments:<BR> [place]  [facility]  N/V/D

## 2012-08-15 ENCOUNTER — Encounter (HOSPITAL_COMMUNITY): Payer: Self-pay

## 2012-08-15 DIAGNOSIS — N179 Acute kidney failure, unspecified: Secondary | ICD-10-CM | POA: Diagnosis present

## 2012-08-15 DIAGNOSIS — I959 Hypotension, unspecified: Secondary | ICD-10-CM | POA: Diagnosis present

## 2012-08-15 DIAGNOSIS — K5289 Other specified noninfective gastroenteritis and colitis: Secondary | ICD-10-CM

## 2012-08-15 DIAGNOSIS — E875 Hyperkalemia: Secondary | ICD-10-CM

## 2012-08-15 DIAGNOSIS — E872 Acidosis: Secondary | ICD-10-CM | POA: Diagnosis present

## 2012-08-15 DIAGNOSIS — R197 Diarrhea, unspecified: Secondary | ICD-10-CM

## 2012-08-15 DIAGNOSIS — R112 Nausea with vomiting, unspecified: Secondary | ICD-10-CM | POA: Diagnosis present

## 2012-08-15 DIAGNOSIS — D61818 Other pancytopenia: Secondary | ICD-10-CM | POA: Diagnosis present

## 2012-08-15 LAB — CBC WITH DIFFERENTIAL/PLATELET
HCT: 34.3 % — ABNORMAL LOW (ref 36.0–46.0)
Hemoglobin: 11.4 g/dL — ABNORMAL LOW (ref 12.0–15.0)
Lymphocytes Relative: 7 % — ABNORMAL LOW (ref 12–46)
Lymphs Abs: 0.3 10*3/uL — ABNORMAL LOW (ref 0.7–4.0)
Monocytes Absolute: 0.2 10*3/uL (ref 0.1–1.0)
Monocytes Relative: 5 % (ref 3–12)
Neutro Abs: 4.1 10*3/uL (ref 1.7–7.7)
Neutrophils Relative %: 88 % — ABNORMAL HIGH (ref 43–77)
RBC: 3.93 MIL/uL (ref 3.87–5.11)
WBC: 4.7 10*3/uL (ref 4.0–10.5)

## 2012-08-15 LAB — URINE MICROSCOPIC-ADD ON

## 2012-08-15 LAB — CBC
MCH: 28.7 pg (ref 26.0–34.0)
MCHC: 32.8 g/dL (ref 30.0–36.0)
RDW: 13.3 % (ref 11.5–15.5)

## 2012-08-15 LAB — URINALYSIS, ROUTINE W REFLEX MICROSCOPIC
Glucose, UA: NEGATIVE mg/dL
Protein, ur: 30 mg/dL — AB
Urobilinogen, UA: 0.2 mg/dL (ref 0.0–1.0)

## 2012-08-15 LAB — BASIC METABOLIC PANEL
Calcium: 8.7 mg/dL (ref 8.4–10.5)
GFR calc Af Amer: 42 mL/min — ABNORMAL LOW (ref >90)
GFR calc non Af Amer: 36 mL/min — ABNORMAL LOW (ref >90)
Potassium: 4.6 mEq/L (ref 3.5–5.1)
Sodium: 138 mEq/L (ref 135–145)

## 2012-08-15 LAB — COMPREHENSIVE METABOLIC PANEL
Alkaline Phosphatase: 104 U/L (ref 39–117)
BUN: 31 mg/dL — ABNORMAL HIGH (ref 6–23)
CO2: 18 mEq/L — ABNORMAL LOW (ref 19–32)
Chloride: 104 mEq/L (ref 96–112)
Creatinine, Ser: 1.61 mg/dL — ABNORMAL HIGH (ref 0.50–1.10)
GFR calc non Af Amer: 31 mL/min — ABNORMAL LOW (ref >90)
Potassium: 5.4 mEq/L — ABNORMAL HIGH (ref 3.5–5.1)
Total Bilirubin: 0.4 mg/dL (ref 0.3–1.2)

## 2012-08-15 MED ORDER — HYDROCODONE-ACETAMINOPHEN 5-325 MG PO TABS
1.0000 | ORAL_TABLET | Freq: Four times a day (QID) | ORAL | Status: DC | PRN
  Administered 2012-08-15 – 2012-08-16 (×2): 1 via ORAL
  Filled 2012-08-15 (×2): qty 1

## 2012-08-15 MED ORDER — ATORVASTATIN CALCIUM 40 MG PO TABS
40.0000 mg | ORAL_TABLET | Freq: Every day | ORAL | Status: DC
  Administered 2012-08-15: 40 mg via ORAL
  Filled 2012-08-15 (×2): qty 1

## 2012-08-15 MED ORDER — METOCLOPRAMIDE HCL 10 MG PO TABS
10.0000 mg | ORAL_TABLET | Freq: Two times a day (BID) | ORAL | Status: DC
  Administered 2012-08-15 – 2012-08-16 (×2): 10 mg via ORAL
  Filled 2012-08-15 (×3): qty 1

## 2012-08-15 MED ORDER — MEGESTROL ACETATE 40 MG/ML PO SUSP
200.0000 mg | Freq: Two times a day (BID) | ORAL | Status: DC | PRN
  Filled 2012-08-15: qty 5

## 2012-08-15 MED ORDER — METOPROLOL SUCCINATE ER 50 MG PO TB24
50.0000 mg | ORAL_TABLET | Freq: Every day | ORAL | Status: DC
  Administered 2012-08-15 – 2012-08-16 (×2): 50 mg via ORAL
  Filled 2012-08-15 (×3): qty 1

## 2012-08-15 MED ORDER — PANCRELIPASE (LIP-PROT-AMYL) 12000-38000 UNITS PO CPEP
1.0000 | ORAL_CAPSULE | Freq: Three times a day (TID) | ORAL | Status: DC
  Administered 2012-08-15 – 2012-08-16 (×5): 1 via ORAL
  Filled 2012-08-15 (×7): qty 1

## 2012-08-15 MED ORDER — ONDANSETRON HCL 4 MG/2ML IJ SOLN
4.0000 mg | Freq: Four times a day (QID) | INTRAMUSCULAR | Status: DC | PRN
  Administered 2012-08-15: 4 mg via INTRAVENOUS
  Filled 2012-08-15: qty 2

## 2012-08-15 MED ORDER — ADULT MULTIVITAMIN W/MINERALS CH
1.0000 | ORAL_TABLET | Freq: Every day | ORAL | Status: DC
  Administered 2012-08-15 – 2012-08-16 (×2): 1 via ORAL
  Filled 2012-08-15 (×2): qty 1

## 2012-08-15 MED ORDER — ONDANSETRON HCL 4 MG/2ML IJ SOLN: 4.0000 mg | Freq: Three times a day (TID) | INTRAMUSCULAR | Status: DC | PRN

## 2012-08-15 MED ORDER — FOLIC ACID 1 MG PO TABS
1.0000 mg | ORAL_TABLET | Freq: Every day | ORAL | Status: DC
  Administered 2012-08-15 – 2012-08-16 (×2): 1 mg via ORAL
  Filled 2012-08-15 (×2): qty 1

## 2012-08-15 MED ORDER — CALCIUM CARBONATE-VIT D-MIN 1200-1000 MG-UNIT PO CHEW: 1.0000 | CHEWABLE_TABLET | Freq: Two times a day (BID) | ORAL | Status: DC

## 2012-08-15 MED ORDER — SODIUM CHLORIDE 0.9 % IV SOLN
INTRAVENOUS | Status: DC
  Administered 2012-08-15: 09:00:00 via INTRAVENOUS

## 2012-08-15 MED ORDER — AMITRIPTYLINE HCL 50 MG PO TABS
50.0000 mg | ORAL_TABLET | Freq: Every day | ORAL | Status: DC
  Administered 2012-08-15: 50 mg via ORAL
  Filled 2012-08-15 (×2): qty 1

## 2012-08-15 MED ORDER — SODIUM CHLORIDE 0.9 % IV SOLN: INTRAVENOUS | Status: DC

## 2012-08-15 MED ORDER — OCTREOTIDE ACETATE 100 MCG/ML IJ SOLN
50.0000 ug | Freq: Two times a day (BID) | INTRAMUSCULAR | Status: DC
  Administered 2012-08-15 – 2012-08-16 (×3): 50 ug via INTRAVENOUS
  Filled 2012-08-15 (×6): qty 0.5

## 2012-08-15 MED ORDER — LOPERAMIDE HCL 2 MG PO CAPS: 2.0000 mg | ORAL_CAPSULE | Freq: Four times a day (QID) | ORAL | Status: DC | PRN

## 2012-08-15 MED ORDER — CALCIUM CARBONATE-VITAMIN D 500-200 MG-UNIT PO TABS
1.0000 | ORAL_TABLET | Freq: Two times a day (BID) | ORAL | Status: DC
  Administered 2012-08-15 – 2012-08-16 (×3): 1 via ORAL
  Filled 2012-08-15 (×5): qty 1

## 2012-08-15 MED ORDER — SODIUM BICARBONATE 8.4 % IV SOLN
INTRAVENOUS | Status: DC
  Administered 2012-08-15 – 2012-08-16 (×3): via INTRAVENOUS
  Filled 2012-08-15 (×6): qty 850

## 2012-08-15 MED ORDER — ASPIRIN EC 81 MG PO TBEC
81.0000 mg | DELAYED_RELEASE_TABLET | Freq: Every day | ORAL | Status: DC
  Administered 2012-08-15 – 2012-08-16 (×2): 81 mg via ORAL
  Filled 2012-08-15 (×2): qty 1

## 2012-08-15 MED ORDER — DIPHENOXYLATE-ATROPINE 2.5-0.025 MG PO TABS: 1.0000 | ORAL_TABLET | Freq: Four times a day (QID) | ORAL | Status: DC | PRN

## 2012-08-15 MED ORDER — GABAPENTIN 300 MG PO CAPS
300.0000 mg | ORAL_CAPSULE | Freq: Three times a day (TID) | ORAL | Status: DC
  Administered 2012-08-15 – 2012-08-16 (×5): 300 mg via ORAL
  Filled 2012-08-15 (×7): qty 1

## 2012-08-15 MED ORDER — ZOLPIDEM TARTRATE 5 MG PO TABS: 5.0000 mg | ORAL_TABLET | Freq: Every evening | ORAL | Status: DC | PRN

## 2012-08-15 MED ORDER — ONDANSETRON HCL 4 MG/2ML IJ SOLN
4.0000 mg | Freq: Once | INTRAMUSCULAR | Status: AC
  Administered 2012-08-15: 4 mg via INTRAVENOUS
  Filled 2012-08-15: qty 2

## 2012-08-15 MED ORDER — PANTOPRAZOLE SODIUM 40 MG PO TBEC
40.0000 mg | DELAYED_RELEASE_TABLET | Freq: Two times a day (BID) | ORAL | Status: DC
  Administered 2012-08-15 – 2012-08-16 (×4): 40 mg via ORAL
  Filled 2012-08-15 (×6): qty 1

## 2012-08-15 MED ORDER — HEPARIN SODIUM (PORCINE) 5000 UNIT/ML IJ SOLN
5000.0000 [IU] | Freq: Three times a day (TID) | INTRAMUSCULAR | Status: DC
  Administered 2012-08-15 – 2012-08-16 (×3): 5000 [IU] via SUBCUTANEOUS
  Filled 2012-08-15 (×7): qty 1

## 2012-08-15 NOTE — ED Notes (Signed)
Pt complains of nausea 

## 2012-08-15 NOTE — ED Notes (Signed)
Pt denies nausea. Medication was effective.

## 2012-08-15 NOTE — H&P (Signed)
[facility] History and Physical  [name]:[id number] DOB: 10/22/1940 DOA: 08/14/2012  Referring physician: ED PCP: Rudi Heap, MD  Specialists: Corinda Gubler cards, [facility]  Chief Complaint: N/V/D  HPI: [name] is a 72 y.o. female who presents with N/V/D which began at about mid day yesterday.  Symptoms are moderate to severe in severity.  This caused her to become very light headed and weak to the point where she had difficulty ambulating.  While she does have a history of chronic secretory diarrhea for which she takes monthly octreotide, this is not typically associated with N/V she states and this is much more severe today.  She does note that while she cant recall any specific sick contacts, she may have come into contact with sick people over the weekend as she did go to church.  She does report some associated mild abdominal pain.  In the ED she was initially hypotensive with BP of 85/55, this improved to 144/73 after IVF treatment.  Her creatinine is mildly elevated at 1.6 (baseline 1.2).  Potassium was mildly elevated on initial labs at 5.4 (recheck pending).  Her temperature was elevated at 99.1 though not technically febrile.  Hospitalist has been asked to admit.  Review of Systems: 12 systems reviewed and otherwise negative.  Past Medical History  Diagnosis Date  . History of CEA (carotid endarterectomy) 03/2007    Left CEA by Dr. Cari Caraway  . Hypertension   . Hyperlipidemia   . GERD (gastroesophageal reflux disease)   . Anemia in chronic kidney disease(285.21) 02/2009    She has been on Aranesp for ? anemia of chronic kidney disease though renal function does not seem severely impaired.  She had repeat bone marrow biopsy by Dr. Cleone Slim 9/10.  Marland Kitchen Episode of syncope     dysauthonomia/ orthostatic syncope - Patient has [employer] loop recorder implanted 8/10.  No events so far - May be due only to orthostasis  . 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    . History of MRI 01/2009    CardiacMRI/EF 66%, mild to moderate concentric LVH, RV size & systolic function normAL w/o evidence for ARVC, trivial pericardial effusion/On delayed enhancement, it was difficult to obtain the TI/There was patchy mid-wall basal delayed enhancement/There was partially circumferential subendocardialdelayed enhancement in the mid to apical LV/This was suggestive of infiltrative cardiomyopathy versus  . History of CT scan of chest 01/2009    to assess for sarcoidosis: no evidence by CT for pulmonary sarcoid  . 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   . Secretory diarrhea      treated with octreotide.   . Esophageal stricture 05/2010    with dilation  . Amyloidosis   . Shortness of breath     when tired  . Cancer 2010    Cancer of the Blood stream   Past Surgical History  Procedure Laterality Date  . Implantable loop recorder  2010    MDT for unexplained syncope  . Partial colectomy  1/11    for diverticular perforation  . Carotid endarterectomy  10/08    left  . Abdominal hysterectomy    . Upper gastrointestinal endoscopy  05/20/2010    EGD ED  . Colonoscopy  03/14/2009  . Ercp  04/09/2011    Procedure: ENDOSCOPIC RETROGRADE CHOLANGIOPANCREATOGRAPHY (ERCP);  Surgeon: Malissa Hippo, MD;  Location: [facility];  Service: Endoscopy;  Laterality: N/A;  10:25  . Sphincterotomy  04/09/2011    Procedure: SPHINCTEROTOMY;  Surgeon: Malissa Hippo, MD;  Location: [facility];  Service: Endoscopy;  Laterality: N/A;  with removal of multiple pigment stones  . Loop recorder explant  Feb. 18, 2014   Social History:  reports that she quit smoking about 33 years ago. Her smoking use included Cigarettes. She has a 5 pack-year smoking history. She has never used smokeless tobacco. She reports that she does not drink alcohol or use  illicit drugs.   No Known Allergies  Family History  Problem Relation Age of Onset  . Heart failure Mother   . Hypertension Mother   . Hyperlipidemia Mother   . Heart attack Mother   . Heart failure Father   . Anesthesia problems Neg Hx   . Hypotension Neg Hx   . Malignant hyperthermia Neg Hx   . Pseudochol deficiency Neg Hx     Prior to Admission medications   Medication Sig Start Date End Date Taking? Authorizing Provider  amitriptyline (ELAVIL) 25 MG tablet Take 50 mg by mouth at bedtime.    Yes Historical Provider, MD  diphenoxylate-atropine (LOMOTIL) 2.5-0.025 MG per tablet Take 1 tablet by mouth every 6 (six) hours as needed. Diarrhea 05/10/12  Yes Historical Provider, MD  loperamide (IMODIUM) 2 MG capsule Take 2 mg by mouth 4 (four) times daily as needed. For diarrhea    Yes Historical Provider, MD  aspirin EC 81 MG tablet Take 1 tablet (81 mg total) by mouth daily. 10/23/10   Laurey Morale, MD  atorvastatin (LIPITOR) 40 MG tablet Take 40 mg by mouth daily.    Historical Provider, MD  Calcium Carbonate-Vit D-Min 1200-1000 MG-UNIT CHEW Chew 1 tablet by mouth 2 (two) times daily.    Historical Provider, MD  folic acid (FOLVITE) 1 MG tablet Take 1 mg by mouth daily.      Historical Provider, MD  gabapentin (NEURONTIN) 300 MG capsule Take 300 mg by mouth 3 (three) times daily.  12/22/11   Historical Provider, MD  HYDROcodone-acetaminophen (NORCO/VICODIN) 5-325 MG per tablet Take 1 tablet by mouth every 6 (six) hours as needed. For pain    Historical Provider, MD  lipase/protease/amylase (CREON-10/PANCREASE) 12000 UNITS CPEP Take 1 capsule by mouth 3 (three) times daily. Per Patient she takes 24,000 units    Historical Provider, MD  lisinopril (PRINIVIL,ZESTRIL) 5 MG tablet Take 5 mg by mouth daily.    Historical Provider, MD  megestrol (MEGACE) 40 MG/ML suspension Take 200 mg by mouth 2 (two) times daily as needed. For appetite control 09/23/10   Historical Provider, MD   metoCLOPramide (REGLAN) 10 MG tablet Take 10 mg by mouth 2 (two) times daily.      Historical Provider, MD  metoprolol succinate (TOPROL-XL) 50 MG 24 hr tablet Take 50 mg by mouth daily. Take with or immediately following a meal.    Historical Provider, MD  Multiple Vitamin (MULTIVITAMIN) tablet Take 1 tablet by mouth daily.      Historical Provider, MD  nystatin-triamcinolone ointment (MYCOLOG) Apply 1 application topically 2 (two) times daily as needed. Apply to ileostomy twice daily as needed until irritation is gone    Historical Provider, MD  octreotide (SANDOSTATIN) 100 MCG/ML SOLN Inject 150 mcg into the skin every 30 (thirty) days.     Historical Provider, MD  ondansetron (ZOFRAN) 8 MG tablet Take 8 mg by mouth every 8 (eight) hours as needed.  For nausea    Historical Provider, MD  pantoprazole (PROTONIX) 40 MG tablet Take 40 mg by mouth 2 (two) times daily before a meal.    Historical Provider, MD  zolpidem (AMBIEN) 10 MG tablet Take 5 mg by mouth at bedtime as needed. For sleep    Historical Provider, MD   Physical Exam: Filed Vitals:   08/15/12 0230 08/15/12 0300 08/15/12 0330 08/15/12 0400  BP: 121/70 93/51 117/74 142/89  Pulse: 89 94 89 94  Temp:      TempSrc:      Resp:      SpO2: 100% 98% 100% 98%    General:  NAD, resting comfortably in bed Eyes: PEERLA EOMI ENT: mucous membranes moist Neck: supple w/o JVD Cardiovascular: RRR w/o MRG Respiratory: CTA B Abdomen: soft, mild diffuse tenderness, colostomy in RLQ, nd, bs+ Skin: no rash nor lesion Musculoskeletal: MAE, full ROM all 4 extremities Psychiatric: normal tone and affect Neurologic: AAOx3, grossly non-focal  Labs on Admission:  Basic Metabolic Panel:  Recent Labs Lab 08/15/12 0016  NA 134*  K 5.4*  CL 104  CO2 18*  GLUCOSE 135*  BUN 31*  CREATININE 1.61*  CALCIUM 9.0   Liver Function Tests:  Recent Labs Lab 08/15/12 0016  AST 22  ALT 16  ALKPHOS 104  BILITOT 0.4  PROT 7.4  ALBUMIN 4.2    No results found for this basename: LIPASE, AMYLASE,  in the last 168 hours No results found for this basename: AMMONIA,  in the last 168 hours CBC:  Recent Labs Lab 08/15/12 0016  WBC 4.7  NEUTROABS 4.1  HGB 11.4*  HCT 34.3*  MCV 87.3  PLT 131*   Cardiac Enzymes: No results found for this basename: CKTOTAL, CKMB, CKMBINDEX, TROPONINI,  in the last 168 hours  BNP (last 3 results) No results found for this basename: PROBNP,  in the last 8760 hours CBG: No results found for this basename: GLUCAP,  in the last 168 hours  Radiological Exams on Admission: No results found.  EKG: Independently reviewed.  Assessment/Plan Principal Problem:   Nausea vomiting and diarrhea Active Problems:   Elevated serum creatinine   Hyperkalemia   1. N/V/D - acute onset symptoms, most likely norovirus which has been going around the [place] area, the patient could have been exposed to this over the weekend when she attended church.  Admitting to inpatient, supportive care with IVF, nausea control. 2. Elevated creatinine - creatinine 1.6 with baseline of 1.2, likely pre-renal given initial hypotension and dehydration, rehydrating patient and monitor. 3. Hyperkalemia - rechecking potassium as this would be somewhat unexpected given the mild degree of kidney dysfunction and severe N/V/D, possibly a false lab value due to hemolysis, hydrating with fluids and putting patient on tele monitor just in case. 4. Chronic pancreatic insufficiency - continue home enzyme replacement    Code Status: Full (must indicate code status--if unknown or must be presumed, indicate so) Family Communication: No family in room (indicate person spoken with, if applicable, with phone number if by telephone) Disposition Plan: Admit to inpatient (indicate anticipated LOS)  Time spent: 70 min  GARDNER, JARED M. [facility] Pager [phone]  If 7PM-7AM, please contact night-coverage [email or web address] Password  [facility] 08/15/2012, 4:52 AM

## 2012-08-15 NOTE — Consult Note (Signed)
WOC ostomy consult  Pt known to this WOC from years of assistance in the home. 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 She is not a surgical candidate for relocation or reanastomosis.  She has a system with 2pc, 2" barrier ring and coloplast strips that works for approximately 24 hours at the most and this has been her consistent pouching system for the last 2 years.  She does not have much output from the mucous fistula in the LLQ but she does wear a pouch on it.     Stoma type/location:  LLQ mucous fistula, slightly oval about 3/4" -placed 1pc drainable pouch on her mucous fistula per her request.   RLQ ileostomy, oval at 1 3/8" x 1" Stomal assessment/size:flush with skin at times,  Peristomal assessment: has some denudation from 3-9 oclock partial thickness skin loss Treatment options for stomal/peristomal skin: no sting skin barrier used to treat denudation and added 2" barrier around the stoma for added seal of wafer and protect from leakage. Output liquid green, high volumes per pt and nursing staff Ostomy pouching: 2pc. 1 3/4" high output pouch uses, ok to hook to BSD if needed in next 24 to keep pouch empty.  Strips used at tape border like pt uses at home.   Pattern left for 1pc and 2pc pouching system. Orders written, supplies ordered.  WOC team will remain available if needed for ostomy support. Melody Hanover RN,CWOCN [phone]

## 2012-08-15 NOTE — Progress Notes (Signed)
Visited with Jan and Gracelyn Nurse (pet therapy team). Patient drowsy. She said she had just had something for pain. She talked about the dogs she has had and what they meant to her life. She also shared how she struggles with not being able to walk and how much she misses being able to have the life she used to have. Support; listening.

## 2012-08-15 NOTE — Progress Notes (Signed)
[facility] PROGRESS NOTE  [name] NFA:[id number] DOB: 1940/09/22 DOA: 08/14/2012 PCP: Rudi Heap, MD  Brief narrative: [name] is an 72 y.o. female with a past medical history of chronic secretory diarrhea, on monthly Sandostatin therapy, who was admitted to the hospital on 08/15/2012 with nausea, vomiting, diarrhea, acute kidney injury, and hypotension.  Assessment/Plan: Principal Problem:   Nausea vomiting and diarrhea -Continue supportive care with IV fluids, antinausea medicines, antidiarrheals. Likely a viral illness. -Will start Sandostatin 50 mcg Q 12. Active Problems:   Pancytopenia -Mild.  Monitor.   Metabolic acidosis -Likely from bicarb losses in the stool.  Change IVF to D5W with 150 mEq of sodium bicarbonate.   HYPERLIPIDEMIA-MIXED -Continue statin therapy.   Secretory diarrhea -Continue usual antidiarrheals.  Start Sandostatin.   Acute kidney injury secondary to dehydration -Creatinine improving with IV fluids.   Hyperkalemia -Secondary to acute kidney injury. Resolved with hydration.   Hypotension, unspecified with history of hypertension -Hold antihypertensives and hydrate.   Code Status: Full. Family Communication: None at bedside. Disposition Plan: Home when stable.   Medical Consultants:  WOC RN  Other Consultants:  None.  Anti-infectives:  None.  HPI/Subjective: [name] is short of breath, weak, with liquid green stool in ostomy pouch.    Objective: Filed Vitals:   08/15/12 0300 08/15/12 0330 08/15/12 0400 08/15/12 0548  BP: 93/51 117/74 142/89 141/89  Pulse: 94 89 94 98  Temp:    98.1 F (36.7 C)  TempSrc:    Oral  Resp:      Height:    5\' 3"  (1.6 m)  Weight:    48.535 kg (107 lb)  SpO2: 98% 100% 98% 100%    Intake/Output Summary (Last 24 hours) at 08/15/12 0802 Last data filed at 08/15/12 0235  Gross per 24 hour  Intake      0 ml  Output    100 ml  Net   -100 ml    Exam: Gen:   NAD Cardiovascular:  RRR, No M/R/G Respiratory:  Lungs CTAB Gastrointestinal:  Abdomen softly distended.  Ostomy with liquid green stool.  Has ? Fistula LLQ, covered with gauze. Extremities:  No C/E/C  Data Reviewed: Basic Metabolic Panel:  Recent Labs Lab 08/15/12 0016 08/15/12 0630  NA 134* 138  K 5.4* 4.6  CL 104 110  CO2 18* 16*  GLUCOSE 135* 140*  BUN 31* 30*  CREATININE 1.61* 1.42*  CALCIUM 9.0 8.7   GFR Estimated Creatinine Clearance: 27.8 ml/min (by C-G formula based on Cr of 1.42). Liver Function Tests:  Recent Labs Lab 08/15/12 0016  AST 22  ALT 16  ALKPHOS 104  BILITOT 0.4  PROT 7.4  ALBUMIN 4.2    Recent Labs Lab 08/15/12 0630  LIPASE 30    CBC:  Recent Labs Lab 08/15/12 0016 08/15/12 0630  WBC 4.7 3.3*  NEUTROABS 4.1  --   HGB 11.4* 10.1*  HCT 34.3* 30.8*  MCV 87.3 87.5  PLT 131* 113*   Microbiology No results found for this or any previous visit (from the past 240 hour(s)).   Procedures and Diagnostic Studies: No results found.  Scheduled Meds: . amitriptyline  50 mg Oral QHS  . aspirin EC  81 mg Oral Daily  . atorvastatin  40 mg Oral Daily  . calcium-vitamin D  1 tablet Oral BID  . folic acid  1 mg Oral Daily  . gabapentin  300 mg Oral TID  . heparin  5,000 Units Subcutaneous Q8H  .  lipase/protease/amylase  1 capsule Oral TID  . metoCLOPramide  10 mg Oral BID  . metoprolol succinate  50 mg Oral Daily  . multivitamin with minerals  1 tablet Oral Daily  . pantoprazole  40 mg Oral BID AC   Continuous Infusions: . sodium chloride      Time spent: 35 minutes.   LOS: 1 day   [name],[name]  [facility] Pager [phone].  If 8PM-8AM, please contact night-coverage at [email or web address], password [facility] 08/15/2012, 8:02 AM

## 2012-08-15 NOTE — ED Provider Notes (Signed)
History     CSN: [id number]  Arrival date & time 08/14/12  2328   First MD Initiated Contact with Patient 08/15/12 0105      Chief Complaint  Patient presents with  . N/V/D     (Consider location/radiation/quality/duration/timing/severity/associated sxs/prior treatment) HPI This is a 72 year old female with multiple medical problems. She is here with nausea, vomiting and diarrhea (increased colostomy output) that began about midday yesterday. The symptoms were moderate to severe. This caused her to become lightheaded and weak to the point that she had difficulty ambulating. She was brought to the ED and given Zofran and IV fluid bolus with significant improvement. She states her nausea is resolved and she feels much less weak. She is having some mild abdominal pain. She also had a dry mouth.  Past Medical History  Diagnosis Date  . History of CEA (carotid endarterectomy) 03/2007    Left CEA by Dr. Cari Caraway  . Hypertension   . Hyperlipidemia   . GERD (gastroesophageal reflux disease)   . Anemia in chronic kidney disease(285.21) 02/2009    She has been on Aranesp for ? anemia of chronic kidney disease though renal function does not seem severely impaired.  She had repeat bone marrow biopsy by Dr. Cleone Slim 9/10.  Marland Kitchen Episode of syncope     dysauthonomia/ orthostatic syncope - Patient has [employer] loop recorder implanted 8/10.  No events so far - May be due only to orthostasis  . 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    . History of MRI 01/2009    CardiacMRI/EF 66%, mild to moderate concentric LVH, RV size & systolic function normAL w/o evidence for ARVC, trivial pericardial effusion/On delayed enhancement, it was difficult to obtain the TI/There was patchy mid-wall basal delayed enhancement/There was partially circumferential subendocardialdelayed enhancement in the mid to apical LV/This was suggestive of infiltrative cardiomyopathy versus  .  History of CT scan of chest 01/2009    to assess for sarcoidosis: no evidence by CT for pulmonary sarcoid  . 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   . Secretory diarrhea      treated with octreotide.   . Esophageal stricture 05/2010    with dilation  . Amyloidosis   . Shortness of breath     when tired  . Cancer 2010    Cancer of the Blood stream    Past Surgical History  Procedure Laterality Date  . Implantable loop recorder  2010    MDT for unexplained syncope  . Partial colectomy  1/11    for diverticular perforation  . Carotid endarterectomy  10/08    left  . Abdominal hysterectomy    . Upper gastrointestinal endoscopy  05/20/2010    EGD ED  . Colonoscopy  03/14/2009  . Ercp  04/09/2011    Procedure: ENDOSCOPIC RETROGRADE CHOLANGIOPANCREATOGRAPHY (ERCP);  Surgeon: Malissa Hippo, MD;  Location: [facility];  Service: Endoscopy;  Laterality: N/A;  10:25  . Sphincterotomy  04/09/2011    Procedure: SPHINCTEROTOMY;  Surgeon: Malissa Hippo, MD;  Location: [facility];  Service: Endoscopy;  Laterality: N/A;  with removal of multiple pigment stones  . Loop recorder explant  Feb. 18, 2014    Family History  Problem Relation Age of Onset  . Heart failure Mother   . Hypertension Mother   . Hyperlipidemia Mother   . Heart attack  Mother   . Heart failure Father   . Anesthesia problems Neg Hx   . Hypotension Neg Hx   . Malignant hyperthermia Neg Hx   . Pseudochol deficiency Neg Hx     History  Substance Use Topics  . Smoking status: Former Smoker -- 1.00 packs/day for 5 years    Types: Cigarettes    Quit date: 09/07/1978  . Smokeless tobacco: Never Used  . Alcohol Use: No    OB History   Grav Para Term Preterm Abortions TAB SAB Ect Mult Living                  Review of Systems  All other systems reviewed and are negative.    Allergies  Review of patient's  allergies indicates no known allergies.  Home Medications   Current Outpatient Rx  Name  Route  Sig  Dispense  Refill  . amitriptyline (ELAVIL) 25 MG tablet   Oral   Take 50 mg by mouth at bedtime.          . diphenoxylate-atropine (LOMOTIL) 2.5-0.025 MG per tablet   Oral   Take 1 tablet by mouth every 6 (six) hours as needed. Diarrhea         . loperamide (IMODIUM) 2 MG capsule   Oral   Take 2 mg by mouth 4 (four) times daily as needed. For diarrhea          . aspirin EC 81 MG tablet   Oral   Take 1 tablet (81 mg total) by mouth daily.         Marland Kitchen atorvastatin (LIPITOR) 40 MG tablet   Oral   Take 40 mg by mouth daily.         . Calcium Carbonate-Vit D-Min 1200-1000 MG-UNIT CHEW   Oral   Chew 1 tablet by mouth 2 (two) times daily.         . folic acid (FOLVITE) 1 MG tablet   Oral   Take 1 mg by mouth daily.           Marland Kitchen gabapentin (NEURONTIN) 300 MG capsule   Oral   Take 300 mg by mouth 3 (three) times daily.          Marland Kitchen HYDROcodone-acetaminophen (NORCO/VICODIN) 5-325 MG per tablet   Oral   Take 1 tablet by mouth every 6 (six) hours as needed. For pain         . lipase/protease/amylase (CREON-10/PANCREASE) 12000 UNITS CPEP   Oral   Take 1 capsule by mouth 3 (three) times daily. Per Patient she takes 24,000 units         . lisinopril (PRINIVIL,ZESTRIL) 5 MG tablet   Oral   Take 5 mg by mouth daily.         . megestrol (MEGACE) 40 MG/ML suspension   Oral   Take 200 mg by mouth 2 (two) times daily as needed. For appetite control         . metoCLOPramide (REGLAN) 10 MG tablet   Oral   Take 10 mg by mouth 2 (two) times daily.           . metoprolol succinate (TOPROL-XL) 50 MG 24 hr tablet   Oral   Take 50 mg by mouth daily. Take with or immediately following a meal.         . Multiple Vitamin (MULTIVITAMIN) tablet   Oral   Take 1 tablet by mouth daily.           Marland Kitchen  nystatin-triamcinolone ointment (MYCOLOG)   Topical   Apply 1  application topically 2 (two) times daily as needed. Apply to ileostomy twice daily as needed until irritation is gone         . octreotide (SANDOSTATIN) 100 MCG/ML SOLN   Subcutaneous   Inject 150 mcg into the skin every 30 (thirty) days.          . ondansetron (ZOFRAN) 8 MG tablet   Oral   Take 8 mg by mouth every 8 (eight) hours as needed. For nausea         . pantoprazole (PROTONIX) 40 MG tablet   Oral   Take 40 mg by mouth 2 (two) times daily before a meal.         . zolpidem (AMBIEN) 10 MG tablet   Oral   Take 5 mg by mouth at bedtime as needed. For sleep           BP 108/70  Pulse 80  Temp(Src) 99.1 F (37.3 C) (Oral)  Resp 18  SpO2 97%  Physical Exam General: Well-developed, cachectic female in no acute distress; appearance consistent with age of record HENT: normocephalic, atraumatic Eyes: pupils equal round and reactive to light; extraocular muscles intact Neck: supple Heart: regular rate and rhythm Lungs: clear to auscultation bilaterally Abdomen: soft; nondistended; mild diffuse tenderness; no masses or hepatosplenomegaly; bowel sounds hyperactive; multiple old, healed surgical scars; colostomy right lower closure Extremities: No deformity; full range of motion; no edema Neurologic: Awake, alert and oriented; motor function intact in all extremities and symmetric; no facial droop Skin: Warm and dry Psychiatric: Normal mood and affect    ED Course  Procedures (including critical care time)     MDM   Nursing notes and vitals signs, including pulse oximetry, reviewed.  Summary of this visit's results, reviewed by myself:  Labs:  Results for orders placed during the hospital encounter of 08/14/12 (from the past 24 hour(s))  CBC WITH DIFFERENTIAL     Status: Abnormal   Collection Time    08/15/12 12:16 AM      Result Value Range   WBC 4.7  4.0 - 10.5 K/uL   RBC 3.93  3.87 - 5.11 MIL/uL   Hemoglobin 11.4 (*) 12.0 - 15.0 g/dL   HCT 16.1  (*) 09.6 - 46.0 %   MCV 87.3  78.0 - 100.0 fL   MCH 29.0  26.0 - 34.0 pg   MCHC 33.2  30.0 - 36.0 g/dL   RDW 04.5  40.9 - 81.1 %   Platelets 131 (*) 150 - 400 K/uL   Neutrophils Relative 88 (*) 43 - 77 %   Neutro Abs 4.1  1.7 - 7.7 K/uL   Lymphocytes Relative 7 (*) 12 - 46 %   Lymphs Abs 0.3 (*) 0.7 - 4.0 K/uL   Monocytes Relative 5  3 - 12 %   Monocytes Absolute 0.2  0.1 - 1.0 K/uL   Eosinophils Relative 0  0 - 5 %   Eosinophils Absolute 0.0  0.0 - 0.7 K/uL   Basophils Relative 0  0 - 1 %   Basophils Absolute 0.0  0.0 - 0.1 K/uL  COMPREHENSIVE METABOLIC PANEL     Status: Abnormal   Collection Time    08/15/12 12:16 AM      Result Value Range   Sodium 134 (*) 135 - 145 mEq/L   Potassium 5.4 (*) 3.5 - 5.1 mEq/L   Chloride 104  96 - 112 mEq/L  CO2 18 (*) 19 - 32 mEq/L   Glucose, Bld 135 (*) 70 - 99 mg/dL   BUN 31 (*) 6 - 23 mg/dL   Creatinine, Ser 6.06 (*) 0.50 - 1.10 mg/dL   Calcium 9.0  8.4 - 30.1 mg/dL   Total Protein 7.4  6.0 - 8.3 g/dL   Albumin 4.2  3.5 - 5.2 g/dL   AST 22  0 - 37 U/L   ALT 16  0 - 35 U/L   Alkaline Phosphatase 104  39 - 117 U/L   Total Bilirubin 0.4  0.3 - 1.2 mg/dL   GFR calc non Af Amer 31 (*) >90 mL/min   GFR calc Af Amer 36 (*) >90 mL/min  URINALYSIS, ROUTINE W REFLEX MICROSCOPIC     Status: Abnormal   Collection Time    08/15/12  2:30 AM      Result Value Range   Color, Urine YELLOW  YELLOW   APPearance CLOUDY (*) CLEAR   Specific Gravity, Urine 1.026  1.005 - 1.030   pH 5.0  5.0 - 8.0   Glucose, UA NEGATIVE  NEGATIVE mg/dL   Hgb urine dipstick TRACE (*) NEGATIVE   Bilirubin Urine SMALL (*) NEGATIVE   Ketones, ur TRACE (*) NEGATIVE mg/dL   Protein, ur 30 (*) NEGATIVE mg/dL   Urobilinogen, UA 0.2  0.0 - 1.0 mg/dL   Nitrite NEGATIVE  NEGATIVE   Leukocytes, UA NEGATIVE  NEGATIVE  URINE MICROSCOPIC-ADD ON     Status: Abnormal   Collection Time    08/15/12  2:30 AM      Result Value Range   Squamous Epithelial / LPF FEW (*) RARE   RBC /  HPF 0-2  <3 RBC/hpf   Bacteria, UA FEW (*) RARE   Casts HYALINE CASTS (*) NEGATIVE   3:31 AM Patient still weak and gets dyspneic on attempts to ambulate. Still has some nausea.         Hanley Seamen, MD 08/15/12 [phone]

## 2012-08-15 NOTE — Progress Notes (Signed)
Spoke with Dr. Darnelle Catalan and received telephone order to d/c cardiac monitoring order.  Eugene Garnet RN

## 2012-08-16 DIAGNOSIS — E785 Hyperlipidemia, unspecified: Secondary | ICD-10-CM

## 2012-08-16 DIAGNOSIS — N179 Acute kidney failure, unspecified: Secondary | ICD-10-CM

## 2012-08-16 DIAGNOSIS — I10 Essential (primary) hypertension: Secondary | ICD-10-CM

## 2012-08-16 LAB — CBC
MCH: 28.8 pg (ref 26.0–34.0)
Platelets: 108 10*3/uL — ABNORMAL LOW (ref 150–400)
RBC: 3.2 MIL/uL — ABNORMAL LOW (ref 3.87–5.11)
WBC: 3.2 10*3/uL — ABNORMAL LOW (ref 4.0–10.5)

## 2012-08-16 LAB — BASIC METABOLIC PANEL
CO2: 27 mEq/L (ref 19–32)
Calcium: 8.4 mg/dL (ref 8.4–10.5)
Chloride: 104 mEq/L (ref 96–112)
Sodium: 140 mEq/L (ref 135–145)

## 2012-08-16 MED ORDER — POTASSIUM CHLORIDE CRYS ER 20 MEQ PO TBCR
40.0000 meq | EXTENDED_RELEASE_TABLET | Freq: Once | ORAL | Status: AC
  Administered 2012-08-16: 40 meq via ORAL
  Filled 2012-08-16: qty 2

## 2012-08-16 NOTE — Evaluation (Signed)
Physical Therapy Evaluation Patient Details Name: [name] MRN: [id number] DOB: February 26, 1941 Today's Date: 08/16/2012 Time: 9629-5284 PT Time Calculation (min): 13 min  PT Assessment / Plan / Recommendation Clinical Impression  Pt admitted for nausea, vomiting, diarrhea.  Pt would benefit from acute PT services in order to improve independence with transfers and ambulation by increasing overall strength and activity tolerance in preparation for d/c home.    PT Assessment  Patient needs continued PT services    Follow Up Recommendations  Home health PT;Supervision/Assistance - 24 hour    Does the patient have the potential to tolerate intense rehabilitation      Barriers to Discharge        Equipment Recommendations  None recommended by PT    Recommendations for Other Services     Frequency Min 3X/week    Precautions / Restrictions Precautions Precautions: Fall   Pertinent Vitals/Pain Reports some abdominal pain but able to tolerate mobility.      Mobility  Bed Mobility Bed Mobility: Supine to Sit Supine to Sit: 5: Supervision;HOB elevated Transfers Transfers: Stand to Sit;Sit to Stand Sit to Stand: 4: Min guard;With upper extremity assist;From bed Stand to Sit: 4: Min guard;With upper extremity assist;To chair/3-in-1 Details for Transfer Assistance: verbal cues for hand placement and backing up with RW to recliner Ambulation/Gait Ambulation/Gait Assistance: 4: Min guard Ambulation Distance (Feet): 160 Feet Assistive device: Rolling walker Ambulation/Gait Assistance Details: verbal cues to keep eyes open, pt reports peripheral neuropathy in R foot which causes antalgic like gait Gait Pattern: Antalgic;Decreased stance time - right;Decreased stride length;Decreased dorsiflexion - right Gait velocity: decreased    Exercises     PT Diagnosis: Difficulty walking  PT Problem List: Decreased strength;Decreased activity tolerance;Decreased mobility;Decreased safety  awareness;Decreased knowledge of use of DME;Impaired sensation PT Treatment Interventions: DME instruction;Gait training;Stair training;Therapeutic activities;Therapeutic exercise;Patient/family education;Functional mobility training   PT Goals Acute Rehab PT Goals PT Goal Formulation: With patient Time For Goal Achievement: 08/30/12 Potential to Achieve Goals: Good Pt will go Sit to Stand: with modified independence PT Goal: Sit to Stand - Progress: Goal set today Pt will go Stand to Sit: with modified independence PT Goal: Stand to Sit - Progress: Goal set today Pt will Ambulate: >150 feet;with modified independence;with least restrictive assistive device PT Goal: Ambulate - Progress: Goal set today Pt will Go Up / Down Stairs: 1-2 stairs;with supervision;with least restrictive assistive device PT Goal: Up/Down Stairs - Progress: Goal set today Pt will Perform Home Exercise Program: with supervision, verbal cues required/provided PT Goal: Perform Home Exercise Program - Progress: Goal set today  Visit Information  Last PT Received On: 08/16/12 Assistance Needed: +1    Subjective Data  Subjective: I do the best I can.   Prior Functioning  Home Living Lives With: Alone Available Help at Discharge: Personal care attendant Type of Home: House Home Access: Stairs to enter [employer] of Steps: 2 Home Layout: One level Home Adaptive Equipment: Walker - rolling;Straight cane Additional Comments: Pt states she has caregives in home every day for a couple hrs Prior Function Level of Independence: Independent with assistive device(s) Comments: pt states she furniture walks at home but uses cane if she goes outside Communication Communication: No difficulties    Cognition  Cognition Overall Cognitive Status: Appears within functional limits for tasks assessed/performed Arousal/Alertness: Awake/alert Orientation Level: Appears intact for tasks assessed Behavior During  Session: [facility] for tasks performed    Extremity/Trunk Assessment Right Upper Extremity Assessment RUE Sensation: History  of peripheral neuropathy Right Lower Extremity Assessment RLE ROM/Strength/Tone: WFL for tasks assessed RLE Sensation: History of peripheral neuropathy Left Lower Extremity Assessment LLE ROM/Strength/Tone: WFL for tasks assessed LLE Sensation: WFL - Light Touch   Balance    End of Session PT - End of Session Activity Tolerance: Patient limited by fatigue Patient left: in chair;with call bell/phone within reach  GP     [facility] E 08/16/2012, 11:28 AM Zenovia Jarred, PT, DPT 08/16/2012 Pager: [phone]

## 2012-08-16 NOTE — Progress Notes (Signed)
Spoke with pt concerning HH needs. Pt states that she will not need HH, have good support at home.

## 2012-08-16 NOTE — Discharge Summary (Signed)
Physician Discharge Summary  [name] RUE:[id number] DOB: [birth year] DOA: 08/14/2012  PCP: Rudi Heap, MD  Admit date: 08/14/2012 Discharge date: 08/16/2012  Recommendations for Outpatient Follow-up:  Followup with primary care physician in about one week after discharge or sooner if symptoms worsen.  Discharge Diagnoses:  Principal Problem:   Nausea vomiting and diarrhea Active Problems:   HYPERLIPIDEMIA-MIXED   HYPERTENSION, UNSPECIFIED   Secretory diarrhea   Acute kidney injury secondary to dehydration   Hyperkalemia   Hypotension, unspecified   Metabolic acidosis   Other pancytopenia    Discharge Condition: Medically stable for discharge home today. Home health physical therapy ordered.  Diet recommendation: As tolerated  History of present illness:  72 y.o. female with a past medical history of chronic secretory diarrhea, on monthly Sandostatin therapy, who was admitted to the hospital on 08/15/2012 with nausea, vomiting, diarrhea, acute kidney injury, and hypotension.   Assessment/Plan:   Principal Problem:  Nausea vomiting and diarrhea   Patient with chronic diarrhea on Sandostatin therapy every month  Patient received supportive care with IV fluids, antinausea medications and antidiarrheals  Patient is hemodynamically stable with blood pressure 111/56. Patient tolerates regular diet and diarrhea improving.  Continue home medications.  Active Problems:  Pancytopenia   Mild. Hemoglobin of 9.2 at the time of discharge. No signs of active bleed  Metabolic acidosis   Patient received sodium bicarbonate in IV fluids. Bicarbonate is now within normal limits. HYPERLIPIDEMIA-MIXED   Continue statin.  Acute kidney injury secondary to dehydration   Creatinine improving with IV fluids, trended down from admission value of 1.61 to 1.3 today Hyperkalemia   Potassium supplement but prior to discharge  Likely secondary to GI losses Hypotension, unspecified  with history of hypertension   Hold lisinopril due to to renal failure.   Code Status: Full.  Family Communication: None at bedside.    Medical Consultants:  WOC RN Other Consultants:  None. Anti-infectives:  None.   Discharge Exam: Filed Vitals:   08/16/12 0606  BP: 111/56  Pulse: 64  Temp: 97.7 F (36.5 C)  Resp: 18   Filed Vitals:   08/15/12 1353 08/15/12 1440 08/15/12 2240 08/16/12 0606  BP: 182/92 162/76 127/70 111/56  Pulse: 92  70 64  Temp: 98 F (36.7 C)  97.8 F (36.6 C) 97.7 F (36.5 C)  TempSrc: Oral  Oral Oral  Resp: 17  16 18   Height:      Weight:      SpO2: 100%  100% 99%    General: Pt is alert, follows commands appropriately, not in acute distress Cardiovascular: Regular rate and rhythm, S1/S2 +, no murmurs, no rubs, no gallops Respiratory: Clear to auscultation bilaterally, no wheezing, no crackles, no rhonchi Abdominal: Soft, non tender, non distended, bowel sounds +, no guarding Extremities: no edema, no cyanosis, pulses palpable bilaterally DP and PT Neuro: Grossly nonfocal  Discharge Instructions  Discharge Orders   Future Appointments Provider Department Dept Phone   09/15/2012 11:00 AM Laurey Morale, MD [facility] Main Office [place]) [phone]   09/18/2012 9:30 AM Exie Parody, MD [facility] MEDICAL ONCOLOGY [phone]   08/15/2013 10:00 AM [facility] [phone]   08/15/2013 11:00 AM Evern Bio, NP [facility] [phone]   Future Orders Complete By Expires     Call MD for:  difficulty breathing, headache or visual disturbances  As directed     Call MD for:  persistant  dizziness or light-headedness  As directed     Call MD for:  persistant nausea and vomiting  As directed     Call MD for:  severe uncontrolled pain  As directed     Diet - low sodium heart healthy  As directed     Discharge instructions  As directed     Comments:       Stop taking lisinopril secondary to renal failure.    Increase activity slowly  As directed         Medication List    STOP taking these medications       lisinopril 5 MG tablet  Commonly known as:  PRINIVIL,ZESTRIL      TAKE these medications       AMBIEN 10 MG tablet  Generic drug:  zolpidem  Take 5 mg by mouth at bedtime as needed. For sleep     amitriptyline 25 MG tablet  Commonly known as:  ELAVIL  Take 50 mg by mouth at bedtime.     aspirin EC 81 MG tablet  Take 1 tablet (81 mg total) by mouth daily.     atorvastatin 40 MG tablet  Commonly known as:  LIPITOR  Take 40 mg by mouth daily.     Calcium Carbonate-Vit D-Min 1200-1000 MG-UNIT Chew  Chew 1 tablet by mouth 2 (two) times daily.     diphenoxylate-atropine 2.5-0.025 MG per tablet  Commonly known as:  LOMOTIL  Take 1 tablet by mouth every 6 (six) hours as needed. Diarrhea     folic acid 1 MG tablet  Commonly known as:  FOLVITE  Take 1 mg by mouth daily.     gabapentin 300 MG capsule  Commonly known as:  NEURONTIN  Take 300 mg by mouth 3 (three) times daily.     HYDROcodone-acetaminophen 5-325 MG per tablet  Commonly known as:  NORCO/VICODIN  Take 1 tablet by mouth every 6 (six) hours as needed. For pain     lipase/protease/amylase 16[address] UNITS Cpep  Commonly known as:  CREON-10/PANCREASE  Take 1 capsule by mouth 3 (three) times daily. Per Patient she takes 24,000 units     loperamide 2 MG capsule  Commonly known as:  IMODIUM  Take 2 mg by mouth 4 (four) times daily as needed. For diarrhea     megestrol 40 MG/ML suspension  Commonly known as:  MEGACE  Take 200 mg by mouth 2 (two) times daily as needed. For appetite control     metoCLOPramide 10 MG tablet  Commonly known as:  REGLAN  Take 10 mg by mouth 2 (two) times daily.     metoprolol succinate 50 MG 24 hr tablet  Commonly known as:  TOPROL-XL  Take 50 mg by mouth daily. Take with or immediately following a meal.     multivitamin  tablet  Take 1 tablet by mouth daily.     nystatin-triamcinolone ointment  Commonly known as:  MYCOLOG  Apply 1 application topically 2 (two) times daily as needed. Apply to ileostomy twice daily as needed until irritation is gone     ondansetron 8 MG tablet  Commonly known as:  ZOFRAN  Take 8 mg by mouth every 8 (eight) hours as needed. For nausea     pantoprazole 40 MG tablet  Commonly known as:  PROTONIX  Take 40 mg by mouth 2 (two) times daily before a meal.           Follow-up Information   Follow up with MOORE,  DONALD, MD In 1 week.   Contact information:   [address] [place] [address] [phone]        The results of significant diagnostics from this hospitalization (including imaging, microbiology, ancillary and laboratory) are listed below for reference.    Significant Diagnostic Studies: No results found.  Microbiology: No results found for this or any previous visit (from the past 240 hour(s)).   Labs: Basic Metabolic Panel:  Recent Labs Lab 08/15/12 0016 08/15/12 0630 08/16/12 0355  NA 134* 138 140  K 5.4* 4.6 3.2*  CL 104 110 104  CO2 18* 16* 27  GLUCOSE 135* 140* 108*  BUN 31* 30* 21  CREATININE 1.61* 1.42* 1.30*  CALCIUM 9.0 8.7 8.4   Liver Function Tests:  Recent Labs Lab 08/15/12 0016  AST 22  ALT 16  ALKPHOS 104  BILITOT 0.4  PROT 7.4  ALBUMIN 4.2    Recent Labs Lab 08/15/12 0630  LIPASE 30   No results found for this basename: AMMONIA,  in the last 168 hours CBC:  Recent Labs Lab 08/15/12 0016 08/15/12 0630 08/16/12 0355  WBC 4.7 3.3* 3.2*  NEUTROABS 4.1  --   --   HGB 11.4* 10.1* 9.2*  HCT 34.3* 30.8* 28.2*  MCV 87.3 87.5 88.1  PLT 131* 113* 108*   Cardiac Enzymes: No results found for this basename: CKTOTAL, CKMB, CKMBINDEX, TROPONINI,  in the last 168 hours BNP: BNP (last 3 results) No results found for this basename: PROBNP,  in the last 8760 hours CBG: No results found for this basename:  GLUCAP,  in the last 168 hours  Time coordinating discharge: Over 30 minutes  Signed:  Manson Passey, MD  [facility]  08/16/2012, 2:04 PM  Pager #: [phone]

## 2012-08-22 ENCOUNTER — Other Ambulatory Visit: Payer: Self-pay | Admitting: *Deleted

## 2012-08-22 ENCOUNTER — Ambulatory Visit (INDEPENDENT_AMBULATORY_CARE_PROVIDER_SITE_OTHER): Payer: Medicare Other | Admitting: Internal Medicine

## 2012-08-22 DIAGNOSIS — R197 Diarrhea, unspecified: Secondary | ICD-10-CM

## 2012-08-22 DIAGNOSIS — E859 Amyloidosis, unspecified: Secondary | ICD-10-CM

## 2012-08-22 DIAGNOSIS — G589 Mononeuropathy, unspecified: Secondary | ICD-10-CM

## 2012-08-22 DIAGNOSIS — M545 Low back pain: Secondary | ICD-10-CM

## 2012-08-22 MED ORDER — METOPROLOL SUCCINATE ER 50 MG PO TB24: ORAL_TABLET | ORAL | Status: DC

## 2012-08-23 ENCOUNTER — Encounter (INDEPENDENT_AMBULATORY_CARE_PROVIDER_SITE_OTHER): Payer: Self-pay | Admitting: Internal Medicine

## 2012-08-23 ENCOUNTER — Ambulatory Visit (INDEPENDENT_AMBULATORY_CARE_PROVIDER_SITE_OTHER): Payer: Medicare Other | Admitting: Internal Medicine

## 2012-08-23 VITALS — BP 94/72 | HR 84 | Temp 98.0°F | Ht 65.0 in | Wt 106.5 lb

## 2012-08-23 DIAGNOSIS — K529 Noninfective gastroenteritis and colitis, unspecified: Secondary | ICD-10-CM

## 2012-08-23 DIAGNOSIS — R197 Diarrhea, unspecified: Secondary | ICD-10-CM

## 2012-08-23 MED ORDER — NYSTATIN-TRIAMCINOLONE 100000-0.1 UNIT/GM-% EX OINT: TOPICAL_OINTMENT | Freq: Two times a day (BID) | CUTANEOUS | Status: DC

## 2012-08-23 MED ORDER — RIFAXIMIN 550 MG PO TABS: 550.0000 mg | ORAL_TABLET | Freq: Two times a day (BID) | ORAL | Status: DC

## 2012-08-23 NOTE — Progress Notes (Addendum)
Subjective:     Patient ID: [name], female   DOB: [birth year], 72 y.o.   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  She was hypotensive in the hospital. Patient has an ileostomy bag.Hx of primary amyloidosis She changes her bag about every 20 minutes. She is emptying the bag about every hour.  She has nausea which comes and goes. Appetite is not good. She is on Megace for her appetite. She has emptied her bag 3 times today. She is taking the Imodium BID and Lomotil every 6 hrs.  CBC    Component Value Date/Time   WBC 3.2* 08/16/2012 0355   WBC 3.9 06/12/2012 0857   RBC 3.20* 08/16/2012 0355   RBC 3.45* 06/12/2012 0857   HGB 9.2* 08/16/2012 0355   HGB 10.4* 06/12/2012 0857   HCT 28.2* 08/16/2012 0355   HCT 30.6* 06/12/2012 0857   PLT 108* 08/16/2012 0355   PLT 142* 06/12/2012 0857   MCV 88.1 08/16/2012 0355   MCV 88.5 06/12/2012 0857   MCH 28.8 08/16/2012 0355   MCH 30.2 06/12/2012 0857   MCHC 32.6 08/16/2012 0355   MCHC 34.2 06/12/2012 0857   RDW 13.1 08/16/2012 0355   RDW 14.3 06/12/2012 0857   LYMPHSABS 0.3* 08/15/2012 0016   LYMPHSABS 1.1 06/12/2012 0857   MONOABS 0.2 08/15/2012 0016   MONOABS 0.3 06/12/2012 0857   EOSABS 0.0 08/15/2012 0016   EOSABS 0.1 06/12/2012 0857   BASOSABS 0.0 08/15/2012 0016   BASOSABS 0.0 06/12/2012 0857    CMP     Component Value Date/Time   NA 140 08/16/2012 0355   NA 142 06/12/2012 0857   NA 138 09/01/2010   K 3.2* 08/16/2012 0355   K 4.7 06/12/2012 0857   CL 104 08/16/2012 0355   CL 108* 06/12/2012 0857   CO2 27 08/16/2012 0355   CO2 25 06/12/2012 0857   GLUCOSE 108* 08/16/2012 0355   GLUCOSE 104* 06/12/2012 0857   BUN 21 08/16/2012 0355   BUN 20.0 06/12/2012 0857   CREATININE 1.30* 08/16/2012 0355   CREATININE 1.2* 06/12/2012 0857   CALCIUM 8.4 08/16/2012 0355   CALCIUM 9.6 06/12/2012 0857   PROT 7.4 08/15/2012 0016   PROT 7.1 06/12/2012 0857   ALBUMIN 4.2 08/15/2012 0016    ALBUMIN 4.0 06/12/2012 0857   AST 22 08/15/2012 0016   AST 18 06/12/2012 0857   ALT 16 08/15/2012 0016   ALT 7 06/12/2012 0857   ALKPHOS 104 08/15/2012 0016   ALKPHOS 120 06/12/2012 0857   BILITOT 0.4 08/15/2012 0016   BILITOT 0.61 06/12/2012 0857   GFRNONAA 40* 08/16/2012 0355   GFRAA 47* 08/16/2012 0355     Review of Systems .see hpi Current Outpatient Prescriptions  Medication Sig Dispense Refill  . amitriptyline (ELAVIL) 25 MG tablet Take 50 mg by mouth at bedtime.       Marland Kitchen aspirin EC 81 MG tablet Take 1 tablet (81 mg total) by mouth daily.      Marland Kitchen atorvastatin (LIPITOR) 40 MG tablet Take 40 mg by mouth daily.      . Calcium Carbonate-Vit D-Min 1200-1000 MG-UNIT CHEW Chew 1 tablet by mouth 2 (two) times daily.      . diphenoxylate-atropine (LOMOTIL) 2.5-0.025 MG per tablet Take 1 tablet by mouth every 6 (six) hours as needed. Diarrhea      . folic acid (FOLVITE) 1 MG tablet Take  1 mg by mouth daily.        Marland Kitchen gabapentin (NEURONTIN) 300 MG capsule Take 300 mg by mouth 3 (three) times daily.       Marland Kitchen HYDROcodone-acetaminophen (NORCO/VICODIN) 5-325 MG per tablet Take 1 tablet by mouth every 6 (six) hours as needed. For pain      . lipase/protease/amylase (CREON-10/PANCREASE) 12000 UNITS CPEP Take 1 capsule by mouth 3 (three) times daily. Per Patient she takes 24,000 units      . loperamide (IMODIUM) 2 MG capsule Take 2 mg by mouth 4 (four) times daily as needed. For diarrhea       . megestrol (MEGACE) 40 MG/ML suspension Take 200 mg by mouth 2 (two) times daily as needed. For appetite control      . metoCLOPramide (REGLAN) 10 MG tablet Take 10 mg by mouth 2 (two) times daily.        . metoprolol succinate (TOPROL-XL) 50 MG 24 hr tablet Take one and one half tablet (75mg ) by mouth daily  45 tablet  5  . Multiple Vitamin (MULTIVITAMIN) tablet Take 1 tablet by mouth daily.        Marland Kitchen nystatin-triamcinolone ointment (MYCOLOG) Apply 1 application topically 2 (two) times daily as needed. Apply to  ileostomy twice daily as needed until irritation is gone      . ondansetron (ZOFRAN) 8 MG tablet Take 8 mg by mouth every 8 (eight) hours as needed. For nausea      . pantoprazole (PROTONIX) 40 MG tablet Take 40 mg by mouth 2 (two) times daily before a meal.      . zolpidem (AMBIEN) 10 MG tablet Take 5 mg by mouth at bedtime as needed. For sleep       No current facility-administered medications for this visit.   Past Medical History  Diagnosis Date  . History of CEA (carotid endarterectomy) 03/2007    Left CEA by Dr. Cari Caraway  . Hypertension   . Hyperlipidemia   . GERD (gastroesophageal reflux disease)   . Anemia in chronic kidney disease(285.21) 02/2009    She has been on Aranesp for ? anemia of chronic kidney disease though renal function does not seem severely impaired.  She had repeat bone marrow biopsy by Dr. Cleone Slim 9/10.  Marland Kitchen Episode of syncope     dysauthonomia/ orthostatic syncope - Patient has Medtronic loop recorder implanted 8/10.  No events so far - May be due only to orthostasis  . 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    . History of MRI 01/2009    CardiacMRI/EF 66%, mild to moderate concentric LVH, RV size & systolic function normAL w/o evidence for ARVC, trivial pericardial effusion/On delayed enhancement, it was difficult to obtain the TI/There was patchy mid-wall basal delayed enhancement/There was partially circumferential subendocardialdelayed enhancement in the mid to apical LV/This was suggestive of infiltrative cardiomyopathy versus  . History of CT scan of chest 01/2009    to assess for sarcoidosis: no evidence by CT for pulmonary sarcoid  . 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   . Secretory diarrhea      treated with octreotide.   . Esophageal stricture 05/2010    with dilation  . Amyloidosis   . Shortness of  breath     when tired  . Cancer 2010    Cancer of the Blood stream  Past Surgical History  Procedure Laterality Date  . Implantable loop recorder  2010    MDT for unexplained syncope  . Partial colectomy  1/11    for diverticular perforation  . Carotid endarterectomy  10/08    left  . Abdominal hysterectomy    . Upper gastrointestinal endoscopy  05/20/2010    EGD ED  . Colonoscopy  03/14/2009  . Ercp  04/09/2011    Procedure: ENDOSCOPIC RETROGRADE CHOLANGIOPANCREATOGRAPHY (ERCP);  Surgeon: Malissa Hippo, MD;  Location: [facility];  Service: Endoscopy;  Laterality: N/A;  10:25  . Sphincterotomy  04/09/2011    Procedure: SPHINCTEROTOMY;  Surgeon: Malissa Hippo, MD;  Location: [facility];  Service: Endoscopy;  Laterality: N/A;  with removal of multiple pigment stones  . Loop recorder explant  Feb. 18, 2014   No Known Allergies       Objective:   Physical Exam  Filed Vitals:   08/23/12 1434  BP: 94/72  Pulse: 84  Temp: 98 F (36.7 C)  Height: 5\' 5"  (1.651 m)  Weight: 106 lb 8 oz (48.308 kg)   Alert and oriented. Skin warm and dry. Oral mucosa is moist.   . Sclera anicteric, conjunctivae is pink. Thyroid not enlarged. No cervical lymphadenopathy. Lungs clear. Heart regular rate and rhythm.  Abdomen is soft.Il;eostomy bag in place. Dressing left mid abdomen.  Bowel sounds are positive. No hepatomegaly. No abdominal masses felt. No tenderness.  No edema to lower extremities      Assessment:    Secretory diarrhea. Hx of amyloidosis. I discussed this case with Dr.Rehman.    Plan:     Creon samples given to patient x 5 bottles. Xifaxin 550mg  BID x 10 days.  Lomotil once a day. Call with progress Monday

## 2012-08-23 NOTE — Patient Instructions (Addendum)
Xifaxin 550mg  BID. Samples of Creon x 5 boxes given to patient.

## 2012-09-15 ENCOUNTER — Ambulatory Visit: Payer: Medicare Other | Admitting: Cardiology

## 2012-09-15 DIAGNOSIS — E785 Hyperlipidemia, unspecified: Secondary | ICD-10-CM

## 2012-09-15 DIAGNOSIS — R197 Diarrhea, unspecified: Secondary | ICD-10-CM

## 2012-09-15 DIAGNOSIS — E859 Amyloidosis, unspecified: Secondary | ICD-10-CM

## 2012-09-15 DIAGNOSIS — I10 Essential (primary) hypertension: Secondary | ICD-10-CM

## 2012-09-18 ENCOUNTER — Telehealth: Payer: Self-pay | Admitting: Oncology

## 2012-09-18 ENCOUNTER — Ambulatory Visit (HOSPITAL_BASED_OUTPATIENT_CLINIC_OR_DEPARTMENT_OTHER): Payer: Medicare Other | Admitting: Lab

## 2012-09-18 ENCOUNTER — Ambulatory Visit (HOSPITAL_BASED_OUTPATIENT_CLINIC_OR_DEPARTMENT_OTHER): Payer: Medicare Other | Admitting: Oncology

## 2012-09-18 VITALS — BP 139/84 | HR 82 | Temp 98.0°F | Resp 20 | Ht 65.0 in | Wt 109.1 lb

## 2012-09-18 DIAGNOSIS — R197 Diarrhea, unspecified: Secondary | ICD-10-CM

## 2012-09-18 DIAGNOSIS — N189 Chronic kidney disease, unspecified: Secondary | ICD-10-CM

## 2012-09-18 DIAGNOSIS — E859 Amyloidosis, unspecified: Secondary | ICD-10-CM

## 2012-09-18 DIAGNOSIS — D638 Anemia in other chronic diseases classified elsewhere: Secondary | ICD-10-CM

## 2012-09-18 LAB — CBC WITH DIFFERENTIAL/PLATELET
Eosinophils Absolute: 0.1 10*3/uL (ref 0.0–0.5)
LYMPH%: 29.6 % (ref 14.0–49.7)
MONO#: 0.3 10*3/uL (ref 0.1–0.9)
NEUT#: 2.4 10*3/uL (ref 1.5–6.5)
Platelets: 148 10*3/uL (ref 145–400)
RBC: 3.83 10*6/uL (ref 3.70–5.45)
WBC: 4 10*3/uL (ref 3.9–10.3)
lymph#: 1.2 10*3/uL (ref 0.9–3.3)

## 2012-09-18 LAB — COMPREHENSIVE METABOLIC PANEL (CC13)
AST: 29 U/L (ref 5–34)
Alkaline Phosphatase: 112 U/L (ref 40–150)
BUN: 11.3 mg/dL (ref 7.0–26.0)
Calcium: 9.9 mg/dL (ref 8.4–10.4)
Creatinine: 1.1 mg/dL (ref 0.6–1.1)
Glucose: 107 mg/dl — ABNORMAL HIGH (ref 70–99)

## 2012-09-18 NOTE — Progress Notes (Signed)
[facility]  [phone] [phone]   OFFICE PROGRESS NOTE   Cc:  Rudi Heap, MD  DIAGNOSIS:  systemic amyloidosis.    PAST THERAPY: underwent treatment with Velcade and dexamethasone.  She had response with decrease in light chain.  However, she developed intractable diarrhea with bowel perforations in 1/11 and 7/11. 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    CURRENT THERAPY:  Watchful observation for myeloma.  She was evaluated at [facility] and was not a candidate for BMT.  She has been on Octreotide for chronic diarrhea.    INTERVAL HISTORY: [name] 72 y.o. female returns for regular follow up by herself.  Her care taker drove her to the clinic but did not join her in the room.  She had presumed viral gastroenteritis in 08/2012.  In addition to her baseline diarrhea due to colectomy and ileostomy, she became dehydrated and was admitted.  She still has high ostomy output requiring monthly Octreotide injection.  She still has not good appetite.  She tries her best but due to short gut syndrome, she is quire discouraged.  She has mild to moderate fatigue.  She is independent of personal hygiene activities but does not do much around the house or chores.  She denied SOB, DOE, chest pain, pedal edema, jaundice, skin rash, bleeding.  She was recently given antibiotic for "stomach" infection.  This is most likely H.pylori.     Past Medical History  Diagnosis Date  . History of CEA (carotid endarterectomy) 03/2007    Left CEA by Dr. Cari Caraway  . Hypertension   . Hyperlipidemia   . GERD (gastroesophageal reflux disease)   . Anemia in chronic kidney disease(285.21) 02/2009    She has been on Aranesp for ? anemia of chronic kidney disease though renal function does not seem severely impaired.  She had repeat bone marrow biopsy by Dr. Cleone Slim 9/10.  Marland Kitchen Episode of syncope     dysauthonomia/ orthostatic syncope - Patient has Medtronic  loop recorder implanted 8/10.  No events so far - May be due only to orthostasis  . 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    . History of MRI 01/2009    CardiacMRI/EF 66%, mild to moderate concentric LVH, RV size & systolic function normAL w/o evidence for ARVC, trivial pericardial effusion/On delayed enhancement, it was difficult to obtain the TI/There was patchy mid-wall basal delayed enhancement/There was partially circumferential subendocardialdelayed enhancement in the mid to apical LV/This was suggestive of infiltrative cardiomyopathy versus  . History of CT scan of chest 01/2009    to assess for sarcoidosis: no evidence by CT for pulmonary sarcoid  . 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   . Secretory diarrhea      treated with octreotide.   . Esophageal stricture 05/2010    with dilation  . Amyloidosis   . Shortness of breath     when tired  . Cancer 2010    Cancer of the Blood stream    Past Surgical History  Procedure Laterality Date  . Implantable loop recorder  2010    MDT for unexplained syncope  . Partial colectomy  1/11    for diverticular perforation  . Carotid endarterectomy  10/08    left  . Abdominal hysterectomy    . Upper  gastrointestinal endoscopy  05/20/2010    EGD ED  . Colonoscopy  03/14/2009  . Ercp  04/09/2011    Procedure: ENDOSCOPIC RETROGRADE CHOLANGIOPANCREATOGRAPHY (ERCP);  Surgeon: Malissa Hippo, MD;  Location: [facility];  Service: Endoscopy;  Laterality: N/A;  10:25  . Sphincterotomy  04/09/2011    Procedure: SPHINCTEROTOMY;  Surgeon: Malissa Hippo, MD;  Location: [facility];  Service: Endoscopy;  Laterality: N/A;  with removal of multiple pigment stones  . Loop recorder explant  Feb. 18, 2014    Current Outpatient Prescriptions  Medication Sig Dispense Refill  . amitriptyline (ELAVIL) 25 MG tablet  Take 50 mg by mouth at bedtime.       Marland Kitchen aspirin EC 81 MG tablet Take 1 tablet (81 mg total) by mouth daily.      Marland Kitchen atorvastatin (LIPITOR) 40 MG tablet Take 40 mg by mouth daily.      . Calcium Carbonate-Vit D-Min 1200-1000 MG-UNIT CHEW Chew 1 tablet by mouth 2 (two) times daily.      . diphenoxylate-atropine (LOMOTIL) 2.5-0.025 MG per tablet Take 1 tablet by mouth every 6 (six) hours as needed. Diarrhea      . folic acid (FOLVITE) 1 MG tablet Take 1 mg by mouth daily.        Marland Kitchen gabapentin (NEURONTIN) 300 MG capsule Take 300 mg by mouth 3 (three) times daily.       Marland Kitchen HYDROcodone-acetaminophen (NORCO/VICODIN) 5-325 MG per tablet Take 1 tablet by mouth every 6 (six) hours as needed. For pain      . lipase/protease/amylase (CREON-10/PANCREASE) 12000 UNITS CPEP Take 1 capsule by mouth 3 (three) times daily. Per Patient she takes 24,000 units      . loperamide (IMODIUM) 2 MG capsule Take 2 mg by mouth 4 (four) times daily as needed. For diarrhea       . megestrol (MEGACE) 40 MG/ML suspension Take 200 mg by mouth 2 (two) times daily as needed. For appetite control      . metoCLOPramide (REGLAN) 10 MG tablet Take 10 mg by mouth 2 (two) times daily.        . metoprolol succinate (TOPROL-XL) 50 MG 24 hr tablet Take one and one half tablet (75mg ) by mouth daily  45 tablet  5  . Multiple Vitamin (MULTIVITAMIN) tablet Take 1 tablet by mouth daily.        Marland Kitchen nystatin-triamcinolone ointment (MYCOLOG) Apply 1 application topically 2 (two) times daily as needed. Apply to ileostomy twice daily as needed until irritation is gone      . nystatin-triamcinolone ointment (MYCOLOG) Apply topically 2 (two) times daily.  30 g  0  . ondansetron (ZOFRAN) 8 MG tablet Take 8 mg by mouth every 8 (eight) hours as needed. For nausea      . pantoprazole (PROTONIX) 40 MG tablet Take 40 mg by mouth 2 (two) times daily before a meal.      . rifaximin (XIFAXAN) 550 MG TABS Take 1 tablet (550 mg total) by mouth 2 (two) times daily.  20  tablet  0  . zolpidem (AMBIEN) 10 MG tablet Take 5 mg by mouth at bedtime as needed. For sleep       No current facility-administered medications for this visit.    ALLERGIES:  has No Known Allergies.  REVIEW OF SYSTEMS:  The rest of the 14-point review of system was negative.   Filed Vitals:   09/18/12 0941  BP: 139/84  Pulse: 82  Temp: 98 F (36.7 C)  Resp: 20   Wt Readings from Last 3 Encounters:  09/18/12 109 lb 1.6 oz (49.487 kg)  08/23/12 106 lb 8 oz (48.308 kg)  08/15/12 107 lb (48.535 kg)   ECOG Performance status: 1-2  PHYSICAL EXAMINATION:   General:  Thin-appearing woman, in no acute distress.  Eyes:  no scleral icterus.  ENT:  There were no oropharyngeal lesions.  Neck was without thyromegaly.  Lymphatics:  Negative cervical, supraclavicular or axillary adenopathy.  Respiratory: lungs were clear bilaterally without wheezing or crackles.  Cardiovascular:  Regular rate and rhythm, S1/S2, without murmur, rub or gallop.  There was no pedal edema.  GI:  abdomen was soft, flat, nontender, nondistended, without organomegaly.  Ostomy in place.  Muscoloskeletal:  no spinal tenderness of palpation of vertebral spine.  Skin exam was without echymosis, petichae.  Neuro exam was nonfocal.  Patient was able to get on and off exam table without assistance.  Gait was normal.  Patient was alerted and oriented.  Attention was good.   Language was appropriate.  Mood was normal without depression.  Speech was not pressured.  Thought content was not tangential.      LABORATORY/RADIOLOGY DATA:  Lab Results  Component Value Date   WBC 4.0 09/18/2012   HGB 11.2* 09/18/2012   HCT 34.1* 09/18/2012   PLT 148 09/18/2012   GLUCOSE 107* 09/18/2012   CHOL 175 11/23/2011   TRIG 159.0* 11/23/2011   HDL 66.80 11/23/2011   LDLDIRECT 155.9 06/29/2010   LDLCALC 76 11/23/2011   ALKPHOS 112 09/18/2012   ALT 17 09/18/2012   AST 29 09/18/2012   NA 141 09/18/2012   K 4.8 09/18/2012   CL 106 09/18/2012   CREATININE 1.1  09/18/2012   BUN 11.3 09/18/2012   CO2 24 09/18/2012   INR 1.18 04/07/2011    ASSESSMENT AND PLAN:   1.  History of amyloidosis:   - Has been in remission.  Seen by Dr. Kathrene Bongo at [facility] and deemed not candidate for BMT given age and co-morbidities.  I am awaiting record from [facility] for confirmation. - Her light chain was elevated both in serum and urine last visit.  I sent for these again.  She has stable cardiac function without renal or hepatic failure.  I prefer observation for now since she tolerated therapy for amyloidosis poorly in the past unless her light chain significantly worsens.   2.  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  This is a chronic problem.  Without this, she will lose weight and become very mal-nourished.  She has been on long acting octreotide with Dr. Cleone Slim in [place] with some control of the symptoms. She again would like to move this to [place] here.  I will inquire about the feasibility.   3.  Anemia of chronic disease:  Will continue to observe.  If she requires frequent pRBC transfusion, I may consider Aranesp.    4.  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  Will continue to monitor.  Cr is stable today.   5.  Congestive heart failure:  Compensated.  On Toprol XL per Cardiology.   6.  HLP:  On atorvastatin.  7.  Depression:  On amitryiptyline.   8.  Mild to moderate calorie/protein malnutrition:  She is on Megace, Ensure Clear.  I referred her to [facility]   9.  Deconditioning: on home PT herself.  Formal PT discharged her.   10.  Follow up:  In about 3-4 months given positive lambda  light chain.   I said goodbye to Ms. Bardales since I will be leaving the practice in about 3 months.  The practice administration will be arranging for her care to ensure continuity.     The length of time of the face-to-face encounter was 25 minutes. More than 50% of time was spent counseling and coordination of care.         Tysen Roesler T. Gaylyn Rong,  M.D.

## 2012-09-18 NOTE — Telephone Encounter (Signed)
, °

## 2012-09-20 ENCOUNTER — Encounter: Payer: Self-pay | Admitting: Cardiology

## 2012-09-20 ENCOUNTER — Ambulatory Visit (INDEPENDENT_AMBULATORY_CARE_PROVIDER_SITE_OTHER): Payer: Medicare Other | Admitting: Cardiology

## 2012-09-20 VITALS — BP 148/71 | HR 88 | Ht 65.0 in | Wt 108.0 lb

## 2012-09-20 DIAGNOSIS — I6529 Occlusion and stenosis of unspecified carotid artery: Secondary | ICD-10-CM

## 2012-09-20 DIAGNOSIS — E639 Nutritional deficiency, unspecified: Secondary | ICD-10-CM

## 2012-09-20 DIAGNOSIS — E785 Hyperlipidemia, unspecified: Secondary | ICD-10-CM

## 2012-09-20 DIAGNOSIS — R42 Dizziness and giddiness: Secondary | ICD-10-CM

## 2012-09-20 DIAGNOSIS — I5023 Acute on chronic systolic (congestive) heart failure: Secondary | ICD-10-CM

## 2012-09-20 LAB — PROTEIN ELECTROPHORESIS, SERUM
Albumin ELP: 61.5 % (ref 55.8–66.1)
Total Protein, Serum Electrophoresis: 7.7 g/dL (ref 6.0–8.3)

## 2012-09-20 LAB — KAPPA/LAMBDA LIGHT CHAINS
Kappa free light chain: 1.37 mg/dL (ref 0.33–1.94)
Kappa:Lambda Ratio: 0.16 — ABNORMAL LOW (ref 0.26–1.65)
Lambda Free Lght Chn: 8.83 mg/dL — ABNORMAL HIGH (ref 0.57–2.63)

## 2012-09-20 LAB — LDL CHOLESTEROL, DIRECT: Direct LDL: 86.6 mg/dL

## 2012-09-20 MED ORDER — METOPROLOL SUCCINATE ER 100 MG PO TB24: 100.0000 mg | ORAL_TABLET | Freq: Every day | ORAL | Status: DC

## 2012-09-20 NOTE — Patient Instructions (Addendum)
Increase Toprol XL (metoprolol succinate) to 100mg  daily. You can take 2 of your 50mg  tablets daily at the same time and use your current supply.  Your physician recommends that you have lab work today-Lipid profile.  Your physician wants you to follow-up in: 4 months with Dr Shirlee Latch. (August 2014).You will receive a reminder letter in the mail two months in advance. If you don't receive a letter, please call our office to schedule the follow-up appointment.

## 2012-09-20 NOTE — Progress Notes (Signed)
Patient ID: [name], female   DOB: [birth year], 72 y.o.   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  She underwent treatment with Velcade and dexamethasone for systemic amyloidosis.  Monoclonal light chain count fell with treatment, but she developed intractable diarrhea with bowel perforations in 1/11 and 7/11.  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  Last echo in 12/13 showed mild LVH, EF 55-60% (LV mid cavity gradient was not reported on this echo).  She was evaluated at [facility] for bone marrow transplant recently but turned down because of comorbidities.   She has stable dyspnea after walking 50-100 feet.  No dyspnea walking around her house.  No chest pain.  She is not getting lightheaded as much and has not passed out since I saw her last.  About a month ago, she had increased diarrhea and was admitted for hydration due to weakness/lightheadedness (no syncope).  Still having copious ostomy output and using octreotide for secretory diarrhea.  Loop recorder was recently explanted.   Labs (7/10): LDL 87, HDL 52, ALT 46, AST 33 Labs (8/10): K 4.4, creatinine 1.0, BNP 119, HCT 30.6, SPEP negative, UPEP positive for monoclonal lambda light chains.  Labs (9/10): creatinine 0.93, C diff negative  Labs (4/11): HDL 58, LDL 127, K 3.5, creatinine 0.8 Labs (3/12): K 4.9, creatinine 1.04, LDL 69, HDL 45 Labs (10/12): K 4.4, creatinine 1.23 Labs (11/12): LDL 54, HDL 54 Labs (6/13): LDL 76, HDL 61, BNP 15.7, K 5, creatinine 1.07 Labs (4/14): K 4.8, creatinine 0.68  Allergies (verified):  No Known Drug Allergies  Past Medical History: 1.  Left CEA (10/08). Carotid dopplers 2/13 with 60-79% RICA stenosis, LICA ok s/p CEA. This is followed at [facility].   Carotid dopplers (3/14) with 40-59% RICA stenosis.  2.  HTN 3.  GERD 4.  Hyperlipidemia 5.  Anemia:  She has  been on Aranesp for ? anemia of chronic kidney disease though renal function does not seem severely impaired.  She had repeat bone marrow biopsy by Dr. Cleone Slim 9/10. 6.  s/p hysterectomy 7.  Mid-cavity dynamic LV gradient:  Echo (4/11) with EF 65-70%, severe concentric LVH, peak LV mid-cavity gradient reaching 45 mmHg with valsalva, grade I diastolic dysfunction, RV hypertrophy, small pericardial effusion.  Echo (5/12) with mild LVH, EF 65-70%, PASP 40 mmHg.  Echo (12/13) with EF 55-60%, mild LVH, PA systolic pressure 31 mmHg, trivial pericardial effusion.  8.  Adenosine myoview (5/10):  No evidence for ischemia or infarction.  9.  Episodes of syncope and presyncope.  - Patient has Medtronic loop recorder implanted 8/10.  No events recorded.  Loop recorder explanted in 2014. - May be due only to orthostasis 10. Cardiac amyloidosis - Significant LVH by initial echo.   - Cardiac MRI (8/10): EF 66%, mild to moderate concentric LVH, RV size and systolic function normal with no evidence for ARVC, trivial pericardial effusion.  On delayed enhancement, it was difficult to obtain the TI.  There was patchy mid-wall basal delayed enhancement.  There was partially circumferential subendocardial delayed enhancement in the mid to apical LV.  This was suggestive of infiltrative cardiomyopathy versus possible HCM.  - SPEP negative.  UPEP positive for monoclonal lambda light chains.  Monoclonal light chains confirmed on testing by Dr. Cleone Slim.  - CT chest (8/10) to assess for sarcoidosis: no evidence by CT for pulmonary sarcoid.  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  - Evaluation at [facility], turned down for bone marrow transplant due to comorbidities.  11.  C. difficile colitis  12. Diverticulitis (1/11) with perforation and colectomy/colostomy.  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   14. Esophageal stricture with dilation 2023/06/01  Family History: Mother died suddenly with presumed MI at 27.  Father died suddenly with presumed MI at age 32.  Cousin died suddenly, ? age.   Social History: Quit tobacco in 1980.  No ETOH or drugs.  Single, lives in [place], 1 child (daughter in [place]).  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   Review of Systems        All systems reviewed and negative except as per HPI.   Current Outpatient Prescriptions  Medication Sig Dispense Refill  . amitriptyline (ELAVIL) 25 MG tablet Take 50 mg by mouth at bedtime.       Marland Kitchen aspirin EC 81 MG tablet Take 1 tablet (81 mg total) by mouth daily.      Marland Kitchen atorvastatin (LIPITOR) 40 MG tablet Take 40 mg by mouth daily.      . Calcium Carbonate-Vit D-Min 1200-1000 MG-UNIT CHEW Chew 1 tablet by mouth 2 (two) times daily.      . diphenoxylate-atropine (LOMOTIL) 2.5-0.025 MG per tablet Take 1 tablet by mouth every 6 (six) hours as needed. Diarrhea      . folic acid (FOLVITE) 1 MG tablet Take 1 mg by mouth daily.        Marland Kitchen gabapentin (NEURONTIN) 300 MG capsule Take 300 mg by mouth 3 (three) times daily.       Marland Kitchen HYDROcodone-acetaminophen (NORCO/VICODIN) 5-325 MG per tablet Take 1 tablet by mouth every 6 (six) hours as needed. For pain      . lipase/protease/amylase (CREON-10/PANCREASE) 12000 UNITS CPEP Take 1 capsule by mouth 3 (three) times daily. Per Patient she takes 24,000 units      . loperamide (IMODIUM) 2 MG capsule Take 2 mg by mouth 4 (four) times daily as needed. For diarrhea       . megestrol (MEGACE) 40 MG/ML suspension Take 200 mg by mouth 2 (two) times daily as needed. For appetite control      . metoCLOPramide (REGLAN) 10 MG tablet Take 10 mg by mouth 2 (two) times daily.        . metoprolol succinate (TOPROL-XL) 100 MG 24 hr tablet Take 1 tablet (100 mg total) by mouth daily. Take with or immediately following a meal.  30 tablet  6  . Multiple Vitamin (MULTIVITAMIN) tablet Take 1 tablet by mouth daily.         Marland Kitchen nystatin-triamcinolone ointment (MYCOLOG) Apply 1 application topically 2 (two) times daily as needed. Apply to ileostomy twice daily as needed until irritation is gone      . nystatin-triamcinolone ointment (MYCOLOG) Apply topically 2 (two) times daily.  30 g  0  . ondansetron (ZOFRAN) 8 MG tablet Take 8 mg by mouth every 8 (eight) hours as needed. For nausea      . pantoprazole (PROTONIX) 40 MG tablet Take 40 mg by mouth 2 (two) times daily before a meal.      . zolpidem (AMBIEN) 10 MG tablet Take 5 mg by mouth at bedtime as needed. For sleep       No current facility-administered medications for this visit.    BP 148/71  Pulse 88  Ht 5\' 5"  (1.651 m)  Wt 108 lb (48.988 kg)  BMI 17.97 kg/m2 General:  Thin, no apparent distress Neck:  Neck supple, no JVD. No masses, thyromegaly or abnormal cervical nodes. Lungs:  Distant BS, no crackles.  Heart:  Non-displaced PMI, chest non-tender; regular rate and rhythm, S1, S2, 2/6 systolic crescendo-decrescendo mid-peaking systolic murmur along sternal border. No S3/S4.  Bilateral carotid bruits. Pedals normal pulses. No edema.  Abdomen:  Bowel sounds positive; abdomen soft and non-tender without masses, organomegaly, or hernias noted. No hepatosplenomegaly.  Extremities:  No clubbing or cyanosis. Neurologic:  Alert and oriented x 3. Psych:  Normal affect.  Assessment/Plan:  CARDIOMYOPATHY, AMYLOID  Patient has a cardiomyopathy with preserved LV systolic function. Delayed enhancement on MRI suggested an infiltrative disease or hypertrophic cardiomyopathy rather than coronary disease, and she has had a negative myoview. 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 This was confirmed by testing at Dr. Bertha Stakes office as well. CT chest was not suggestive of pulmonary sarcoidosis. Given the finding of monoclonal light chains and suggestive MRI findings, she was treated for  amyloidosis by Dr. Cleone Slim with Velcade and dexamethasone. Unfortunately, she developed secretory diarrhea and had 2 bowel perforations. Therefore, she is off treatment now. She was recently evaluated at [facility] for treatment of her amyloidosis but turned down for bone marrow transplant. - No Lasix as she does not appear volume overloaded and this could worsen her mid-cavity gradient and orthostasis.  - Increase Toprol XL to 100 mg daily as this may help decrease LV mid-cavity gradient (past echoes have shown dynamic LV mid-cavity gradient and she has a prominent systolic murmur on exam).  Carotid stenosis Stable carotid disease, followed at [facility].  HYPERLIPIDEMIA-MIXED  Continue statin given known vascular disease (carotid stenosis).  Will check lipids today.  ORTHOSTATIC DIZZINESS I think this is primarily due to dehydration in the setting of secretory diarrhea, probably made worse by LV mid-cavity gradient (hopefully uptitration of beta blocker will help).  Needs to stay hydrated. No arrhythmias on loop recorder (recently explanted) => doubt her symptoms are arrhythmia-related.   Marca Ancona 09/20/2012

## 2012-09-22 LAB — UIFE/LIGHT CHAINS/TP QN, 24-HR UR
Free Kappa Lt Chains,Ur: 8.9 mg/dL — ABNORMAL HIGH (ref 0.14–2.42)
Free Kappa/Lambda Ratio: 0.43 ratio — ABNORMAL LOW (ref 2.04–10.37)
Free Lambda Excretion/Day: 225.5 mg/d
Free Lt Chn Excr Rate: 97.9 mg/d
Gamma Globulin, Urine: DETECTED — AB
Time: 24 hours
Total Protein, Urine: 31 mg/dL
Volume, Urine: 1100 mL

## 2012-09-25 ENCOUNTER — Telehealth: Payer: Self-pay | Admitting: Nutrition

## 2012-09-25 ENCOUNTER — Encounter: Payer: Medicare Other | Admitting: Nutrition

## 2012-09-25 NOTE — Telephone Encounter (Signed)
Physician asked if I would contact patient by telephone. This is a 72 year old female patient of Dr. Gaylyn Rong diagnosed with amyloidosis.  Past medical history includes carotid endarterectomy, hypertension, hyperlipidemia, GERD, anemia in chronic kidney disease, cardiomyopathy, diverticulosis, colitis with perforation and colostomy, esophageal stricture.  Medications include Elavil, Lipitor, calcium with vitamin D, Lomotil, Folvite, 24,000 units Creon 3 times a day, Imodium, Megace, Reglan, multivitamin, Protonix, and Zofran.  Labs include glucose 107, triglycerides 159, hemoglobin 11.2, hematocrit 34.1.  Height: 65 inches. Weight: 109.1 pounds April 7. BMI 18.16.  Patient reports issues with continuous diarrhea and dehydration. Noted patient had bowel perforation in January 2011 and again in July 2011. She has had increased ostomy output  requiring octreotide injections.  Nutrition diagnosis: Food and nutrition related knowledge deficit related to intractable diarrhea and poor appetite as evidenced by patient's self-report of not understanding what to eat.  Intervention: I educated patient on the importance of smaller more frequent meals. We've discussed the importance of hydration and how she could increase fluid intake. I've educated her on food she should avoid. Also educated her on food she could add into her diet to help thicken her stool. I have mailed fact sheets to patient along with my contact information. Teach back method used. Patient will contact me with further questions or concerns.  Monitoring, evaluation, goals: Patient will tolerate adequate calories and protein to maintain hydration and promote maintenance of lean body mass.  Next visit: Patient will contact me for questions or concerns.

## 2012-10-03 ENCOUNTER — Other Ambulatory Visit: Payer: Self-pay | Admitting: *Deleted

## 2012-10-03 NOTE — Progress Notes (Signed)
Asked [name] to see if pt can be approved to receive monthly Octreotide injections here at [facility].

## 2012-10-10 ENCOUNTER — Encounter: Payer: Self-pay | Admitting: *Deleted

## 2012-10-10 NOTE — Progress Notes (Signed)
VM left for [name] in Managed Care dept to see if pt can be approved by her insurance for Octreotide injections at [facility].

## 2012-10-17 DIAGNOSIS — E859 Amyloidosis, unspecified: Secondary | ICD-10-CM

## 2012-10-17 DIAGNOSIS — R197 Diarrhea, unspecified: Secondary | ICD-10-CM

## 2012-10-18 ENCOUNTER — Encounter (INDEPENDENT_AMBULATORY_CARE_PROVIDER_SITE_OTHER): Payer: Self-pay

## 2012-11-02 ENCOUNTER — Other Ambulatory Visit: Payer: Self-pay | Admitting: Oncology

## 2012-11-07 ENCOUNTER — Other Ambulatory Visit: Payer: Self-pay | Admitting: *Deleted

## 2012-11-07 ENCOUNTER — Other Ambulatory Visit: Payer: Self-pay | Admitting: Oncology

## 2012-11-07 NOTE — Telephone Encounter (Signed)
Called in lomotil refill to [facility] in [place] and notified pt of refill.  She verbalized understanding.

## 2012-11-14 DIAGNOSIS — R197 Diarrhea, unspecified: Secondary | ICD-10-CM

## 2012-11-14 DIAGNOSIS — E859 Amyloidosis, unspecified: Secondary | ICD-10-CM

## 2012-11-14 DIAGNOSIS — D649 Anemia, unspecified: Secondary | ICD-10-CM

## 2012-11-28 ENCOUNTER — Other Ambulatory Visit (INDEPENDENT_AMBULATORY_CARE_PROVIDER_SITE_OTHER): Payer: Self-pay | Admitting: *Deleted

## 2012-11-28 ENCOUNTER — Encounter (INDEPENDENT_AMBULATORY_CARE_PROVIDER_SITE_OTHER): Payer: Self-pay | Admitting: Internal Medicine

## 2012-11-28 ENCOUNTER — Ambulatory Visit (INDEPENDENT_AMBULATORY_CARE_PROVIDER_SITE_OTHER): Payer: Medicare Other | Admitting: Internal Medicine

## 2012-11-28 ENCOUNTER — Encounter (INDEPENDENT_AMBULATORY_CARE_PROVIDER_SITE_OTHER): Payer: Self-pay | Admitting: *Deleted

## 2012-11-28 ENCOUNTER — Other Ambulatory Visit (INDEPENDENT_AMBULATORY_CARE_PROVIDER_SITE_OTHER): Payer: Self-pay | Admitting: Internal Medicine

## 2012-11-28 VITALS — BP 114/66 | HR 72 | Temp 98.8°F | Ht 63.0 in | Wt 108.4 lb

## 2012-11-28 DIAGNOSIS — K529 Noninfective gastroenteritis and colitis, unspecified: Secondary | ICD-10-CM

## 2012-11-28 DIAGNOSIS — R197 Diarrhea, unspecified: Secondary | ICD-10-CM

## 2012-11-28 DIAGNOSIS — R1310 Dysphagia, unspecified: Secondary | ICD-10-CM

## 2012-11-28 MED ORDER — NYSTATIN-TRIAMCINOLONE 100000-0.1 UNIT/GM-% EX OINT: TOPICAL_OINTMENT | Freq: Two times a day (BID) | CUTANEOUS | Status: DC

## 2012-11-28 NOTE — Progress Notes (Addendum)
Subjective:     Patient ID: [name], female   DOB: 1940-12-02, 72 y.o.   MRN: [id number]  HPI Here today for a scheduled visit. She continues to have diarrhea. She has gained 2 pounds since her lat visit in March.  She has an ileostomy for a diverticular perforation.  Presently taking Creon for her diarrhea.  She empties the bag about four times a day which is normal for her. Appetite is good. No weight loss. Stools are green to light in color. She takes Imodium, Lomotil on a prn Basis.  She has a hx of primary amyloidosis.  She does tell me with the addition of the Creon, has helped reduce her ileostomy output.  She has some problems with dysphagia. She tells me meats and pills are lodging. She has to cut the pills up.   Hx of C-difficile Colonoscopy 2010: Dr Karilyn Cota: pancolonic diverticulosis. Negative rectal biopsy for amyloidosis and focal colitis sigmoid colon which systologically was non-specific and felt to not to be source of diarrhea.    Laparotomy Dr. Gabriel Cirri 07/12/2009: perforated sigmoid diverticular with peritonitis. Sigmoid resection with end sigmoid colostomy and Hartmann's pouch and she also had a small resection with primary anastomosis.  Small bowel segment that was removed was involved in the inflammatory process.  In July of 2011, had perforation of cecum and had a rt hemicolectomy and ileostomy by Dr. Lovell Sheehan   EGD/ED 2011 Dr. Karilyn Cota: No structural abnormality noted to esophageal mucosa. No evidence of Schatzki's ring or esophageal web. Esophagus dilated by passing 46 [place] and 48 [place] Maloney dilators but no mucosal disruption induced. Esophageal biopsies obtained to rule out amyloidosis or other abnormalities. ( I was unable to locate the biopsy)  CBC    Component Value Date/Time   WBC 4.0 09/18/2012 1041   WBC 3.2* 08/16/2012 0355   RBC 3.83 09/18/2012 1041   RBC 3.20* 08/16/2012 0355   HGB 11.2* 09/18/2012 1041   HGB 9.2* 08/16/2012 0355   HCT 34.1* 09/18/2012 1041   HCT 28.2* 08/16/2012 0355   PLT 148 09/18/2012 1041   PLT 108* 08/16/2012 0355   MCV 89.1 09/18/2012 1041   MCV 88.1 08/16/2012 0355   MCH 29.2 09/18/2012 1041   MCH 28.8 08/16/2012 0355   MCHC 32.8 09/18/2012 1041   MCHC 32.6 08/16/2012 0355   RDW 14.3 09/18/2012 1041   RDW 13.1 08/16/2012 0355   LYMPHSABS 1.2 09/18/2012 1041   LYMPHSABS 0.3* 08/15/2012 0016   MONOABS 0.3 09/18/2012 1041   MONOABS 0.2 08/15/2012 0016   EOSABS 0.1 09/18/2012 1041   EOSABS 0.0 08/15/2012 0016   BASOSABS 0.0 09/18/2012 1041   BASOSABS 0.0 08/15/2012 0016    CMP     Component Value Date/Time   NA 141 09/18/2012 1041   NA 140 08/16/2012 0355   NA 138 09/01/2010   K 4.8 09/18/2012 1041   K 3.2* 08/16/2012 0355   CL 106 09/18/2012 1041   CL 104 08/16/2012 0355   CO2 24 09/18/2012 1041   CO2 27 08/16/2012 0355   GLUCOSE 107* 09/18/2012 1041   GLUCOSE 108* 08/16/2012 0355   BUN 11.3 09/18/2012 1041   BUN 21 08/16/2012 0355   CREATININE 1.1 09/18/2012 1041   CREATININE 1.30* 08/16/2012 0355   CALCIUM 9.9 09/18/2012 1041   CALCIUM 8.4 08/16/2012 0355   PROT 7.8 09/18/2012 1041   PROT 7.4 08/15/2012 0016   ALBUMIN 4.3 09/18/2012 1041   ALBUMIN 4.2 08/15/2012 0016   AST 29  09/18/2012 1041   AST 22 08/15/2012 0016   ALT 17 09/18/2012 1041   ALT 16 08/15/2012 0016   ALKPHOS 112 09/18/2012 1041   ALKPHOS 104 08/15/2012 0016   BILITOT 0.68 09/18/2012 1041   BILITOT 0.4 08/15/2012 0016   GFRNONAA 40* 08/16/2012 0355   GFRAA 47* 08/16/2012 0355       Review of Systems Current Outpatient Prescriptions  Medication Sig Dispense Refill  . amitriptyline (ELAVIL) 25 MG tablet Take 50 mg by mouth at bedtime.       Marland Kitchen aspirin EC 81 MG tablet Take 1 tablet (81 mg total) by mouth daily.      Marland Kitchen atorvastatin (LIPITOR) 40 MG tablet Take 40 mg by mouth daily.      . Calcium Carbonate-Vit D-Min 1200-1000 MG-UNIT CHEW Chew 1 tablet by mouth 2 (two) times daily.      . diphenoxylate-atropine (LOMOTIL) 2.5-0.025 MG per tablet TAKE ONE TABLET BY MOUTH EVERY 6 HOURS AS NEEDED FOR DIARRHEA  100 tablet  0   . folic acid (FOLVITE) 1 MG tablet Take 1 mg by mouth daily.        Marland Kitchen gabapentin (NEURONTIN) 300 MG capsule Take 300 mg by mouth 3 (three) times daily.       Marland Kitchen HYDROcodone-acetaminophen (NORCO/VICODIN) 5-325 MG per tablet Take 1 tablet by mouth every 6 (six) hours as needed. For pain      . lipase/protease/amylase (CREON-10/PANCREASE) 12000 UNITS CPEP Take 1 capsule by mouth 3 (three) times daily. Per Patient she takes 24,000 units      . loperamide (IMODIUM) 2 MG capsule Take 2 mg by mouth 4 (four) times daily as needed. For diarrhea       . megestrol (MEGACE) 40 MG/ML suspension Take 200 mg by mouth 2 (two) times daily as needed. For appetite control      . metoCLOPramide (REGLAN) 10 MG tablet Take 10 mg by mouth 2 (two) times daily.        . metoprolol succinate (TOPROL-XL) 100 MG 24 hr tablet Take 1 tablet (100 mg total) by mouth daily. Take with or immediately following a meal.  30 tablet  6  . Multiple Vitamin (MULTIVITAMIN) tablet Take 1 tablet by mouth daily.        . ondansetron (ZOFRAN) 8 MG tablet Take 8 mg by mouth every 8 (eight) hours as needed. For nausea      . pantoprazole (PROTONIX) 40 MG tablet Take 40 mg by mouth 2 (two) times daily before a meal.      . zolpidem (AMBIEN) 10 MG tablet Take 5 mg by mouth at bedtime as needed. For sleep      . nystatin-triamcinolone ointment (MYCOLOG) Apply topically 2 (two) times daily.  30 g  0   No current facility-administered medications for this visit.   Past Medical History  Diagnosis Date  . History of CEA (carotid endarterectomy) 03/2007    Left CEA by Dr. Cari Caraway  . Hypertension   . Hyperlipidemia   . GERD (gastroesophageal reflux disease)   . Anemia in chronic kidney disease(285.21) 02/2009    She has been on Aranesp for ? anemia of chronic kidney disease though renal function does not seem severely impaired.  She had repeat bone marrow biopsy by Dr. Cleone Slim 9/10.  Marland Kitchen Episode of syncope     dysauthonomia/ orthostatic syncope  - Patient has Medtronic loop recorder implanted 8/10.  No events so far - May be due only to orthostasis  .  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    . History of MRI 01/2009    CardiacMRI/EF 66%, mild to moderate concentric LVH, RV size & systolic function normAL w/o evidence for ARVC, trivial pericardial effusion/On delayed enhancement, it was difficult to obtain the TI/There was patchy mid-wall basal delayed enhancement/There was partially circumferential subendocardialdelayed enhancement in the mid to apical LV/This was suggestive of infiltrative cardiomyopathy versus  . History of CT scan of chest 01/2009    to assess for sarcoidosis: no evidence by CT for pulmonary sarcoid  . 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   . Secretory diarrhea      treated with octreotide.   . Esophageal stricture 05/2010    with dilation  . Amyloidosis   . Shortness of breath     when tired  . Cancer 2010    Cancer of the Blood stream   Past Surgical History  Procedure Laterality Date  . Implantable loop recorder  2010    MDT for unexplained syncope  . Partial colectomy  1/11    for diverticular perforation  . Carotid endarterectomy  10/08    left  . Abdominal hysterectomy    . Upper gastrointestinal endoscopy  05/20/2010    EGD ED  . Colonoscopy  03/14/2009  . Ercp  04/09/2011    Procedure: ENDOSCOPIC RETROGRADE CHOLANGIOPANCREATOGRAPHY (ERCP);  Surgeon: Malissa Hippo, MD;  Location: [facility];  Service: Endoscopy;  Laterality: N/A;  10:25  . Sphincterotomy  04/09/2011    Procedure: SPHINCTEROTOMY;  Surgeon: Malissa Hippo, MD;  Location: [facility];  Service: Endoscopy;  Laterality: N/A;  with removal of multiple pigment stones  . Loop recorder explant  Feb. 18, 2014    rem[facility] 3 months ago. at [facility]   No Known Allergies     Objective:    Physical Exam  Filed Vitals:   11/28/12 0941  BP: 114/66  Pulse: 72  Temp: 98.8 F (37.1 C)  Height: 5\' 3"  (1.6 m)  Weight: 108 lb 6.4 oz (49.17 kg)   Alert and oriented. Skin warm and dry. Oral mucosa is moist.   . Sclera anicteric, conjunctivae is pink. Thyroid not enlarged. No cervical lymphadenopathy. Lungs clear. Heart regular rate and rhythm.  Abdomen is soft. Bowel sounds are positive. Ileotomy bag in place and empty. Dressing left mid abdomen: no drainage noted. No hepatomegaly. No abdominal masses felt. No tenderness.  No edema to lower extremities      Assessment:    Secretory diarrhea. Emptying bag about 4 times a day which is normal. No weight loss which is encouraging. She has gained 2 pounds.    Plan:    OV in 6 months. Continue the Creon and other medications. If any problems call our office.

## 2012-11-28 NOTE — Patient Instructions (Addendum)
OV in 6 months. Continue the Creon.

## 2012-12-01 ENCOUNTER — Encounter (HOSPITAL_COMMUNITY): Payer: Self-pay | Admitting: Pharmacy Technician

## 2012-12-07 ENCOUNTER — Encounter: Payer: Medicare Other | Admitting: Internal Medicine

## 2012-12-07 DIAGNOSIS — E859 Amyloidosis, unspecified: Secondary | ICD-10-CM

## 2012-12-07 DIAGNOSIS — R197 Diarrhea, unspecified: Secondary | ICD-10-CM

## 2012-12-08 ENCOUNTER — Other Ambulatory Visit: Payer: Self-pay | Admitting: Oncology

## 2012-12-08 DIAGNOSIS — R197 Diarrhea, unspecified: Secondary | ICD-10-CM

## 2012-12-08 NOTE — Telephone Encounter (Signed)
Patient called to request refill on Lomotil. Asking if this will need to be done every month? Made her aware that for now we will approve monthly until her new physician takes over care and he/she can then determine how many refills to approve at one time. She understands and agrees.

## 2012-12-12 DIAGNOSIS — D649 Anemia, unspecified: Secondary | ICD-10-CM

## 2012-12-12 DIAGNOSIS — E859 Amyloidosis, unspecified: Secondary | ICD-10-CM

## 2012-12-12 DIAGNOSIS — R197 Diarrhea, unspecified: Secondary | ICD-10-CM

## 2012-12-19 ENCOUNTER — Encounter (HOSPITAL_COMMUNITY): Payer: Self-pay | Admitting: *Deleted

## 2012-12-19 ENCOUNTER — Encounter (HOSPITAL_COMMUNITY)
Admission: RE | Disposition: A | Discharge status: Home / Self Care | Payer: Self-pay | Source: Ambulatory Visit | Attending: Internal Medicine

## 2012-12-19 ENCOUNTER — Ambulatory Visit (HOSPITAL_COMMUNITY)
Admission: RE | Admit: 2012-12-19 | Discharge: 2012-12-19 | Disposition: A | Discharge status: Home / Self Care | Payer: Medicare Other | Source: Ambulatory Visit | Attending: Internal Medicine | Admitting: Internal Medicine

## 2012-12-19 DIAGNOSIS — R197 Diarrhea, unspecified: Secondary | ICD-10-CM | POA: Insufficient documentation

## 2012-12-19 DIAGNOSIS — K319 Disease of stomach and duodenum, unspecified: Secondary | ICD-10-CM | POA: Insufficient documentation

## 2012-12-19 DIAGNOSIS — R1310 Dysphagia, unspecified: Secondary | ICD-10-CM | POA: Insufficient documentation

## 2012-12-19 DIAGNOSIS — E8589 Other amyloidosis: Secondary | ICD-10-CM | POA: Insufficient documentation

## 2012-12-19 DIAGNOSIS — K222 Esophageal obstruction: Secondary | ICD-10-CM

## 2012-12-19 DIAGNOSIS — I10 Essential (primary) hypertension: Secondary | ICD-10-CM | POA: Insufficient documentation

## 2012-12-19 HISTORY — PX: ESOPHAGOGASTRODUODENOSCOPY (EGD) WITH ESOPHAGEAL DILATION: SHX5812

## 2012-12-19 SURGERY — ESOPHAGOGASTRODUODENOSCOPY (EGD) WITH ESOPHAGEAL DILATION
Anesthesia: Moderate Sedation

## 2012-12-19 MED ORDER — STERILE WATER FOR IRRIGATION IR SOLN
Status: DC | PRN
  Administered 2012-12-19: 11:00:00

## 2012-12-19 MED ORDER — MEPERIDINE HCL 50 MG/ML IJ SOLN
INTRAMUSCULAR | Status: AC
  Filled 2012-12-19: qty 1

## 2012-12-19 MED ORDER — MEPERIDINE HCL 25 MG/ML IJ SOLN
INTRAMUSCULAR | Status: DC | PRN
  Administered 2012-12-19 (×2): 25 mg via INTRAVENOUS

## 2012-12-19 MED ORDER — BUTAMBEN-TETRACAINE-BENZOCAINE 2-2-14 % EX AERO
INHALATION_SPRAY | CUTANEOUS | Status: DC | PRN
  Administered 2012-12-19: 2 via TOPICAL

## 2012-12-19 MED ORDER — SODIUM CHLORIDE 0.9 % IV SOLN
INTRAVENOUS | Status: DC
  Administered 2012-12-19: 10:00:00 via INTRAVENOUS

## 2012-12-19 MED ORDER — MIDAZOLAM HCL 5 MG/5ML IJ SOLN
INTRAMUSCULAR | Status: AC
  Filled 2012-12-19: qty 10

## 2012-12-19 MED ORDER — MIDAZOLAM HCL 5 MG/5ML IJ SOLN
INTRAMUSCULAR | Status: DC | PRN
  Administered 2012-12-19: 2 mg via INTRAVENOUS
  Administered 2012-12-19: 1 mg via INTRAVENOUS

## 2012-12-19 NOTE — Op Note (Signed)
EGD PROCEDURE REPORT  PATIENT:  [name]  MR#:  [id number] Birthdate:  12/07/1940, 72 y.o., female Endoscopist:  Dr. Malissa Hippo, MD Referred By:  Dr. Rudi Heap, MD  Procedure Date: 12/19/2012  Procedure:   EGD with ED.  Indications:  Patient is 72 year old African female with multiple medical problems including amyloidosis and secretory diarrhea who presents with dysphagia to solids and pills. Her last esophageal dilation was over three years ago and she had good response.            Informed Consent:  The risks, benefits, alternatives & imponderables which include, but are not limited to, bleeding, infection, perforation, drug reaction and potential missed lesion have been reviewed.  The potential for biopsy, lesion removal, esophageal dilation, etc. have also been discussed.  Questions have been answered.  All parties agreeable.  Please see history & physical in medical record for more information.  Medications:  Demerol 50 mg IV Versed 4 mg IV Cetacaine spray topically for oropharyngeal anesthesia  Description of procedure:  The endoscope was introduced through the mouth and advanced to the second portion of the duodenum without difficulty or limitations. The mucosal surfaces were surveyed very carefully during advancement of the scope and upon withdrawal.  Findings:  Esophagus:  Mucosa of the esophagus was normal. Stricture noted at GE junction. Some resistance noted as the scope was passed across it. GEJ:  40 cm Stomach:  There was some food debris in the stomach. Stomach distended reasonably well. Examination mucosa revealed diffuse abnormality with erythema and mosaic pattern. No erosions or ulcers were noted. Pyloric channel was patent. Angularis, fundus and cardia were examined by retroflex the scope and were normal. Duodenum:  Normal bulbar and post bulbar mucosa.  Therapeutic/Diagnostic Maneuvers Performed:   Esophagus was dilated by passing 48 and 52 [place] Maloney  dilators to full insertion. Endoscope was passed again post dilation and mucosal destruction noted to GE junction. I was able to pass the scope across it without any resistance. Essick biopsy was taken from the antrum looking for amyloidosis.  Complications:  None  Impression: Distal esophageal stricture. Esophagus dilated by passing 48 and 52 [place] Maloney dilators. Food debris in the stomach with patent pylorus secondary to known gastroparesis. Abnormal appearance to gastric mucosa. Biopsy taken to rule out gastric amyloidosis.  Recommendations:  Full liquids today. Resume usual diet starting in am.  I will be contacting patient with results of biopsy.  Ulysse Siemen U  12/19/2012  11:21 AM  CC: Dr. Rudi Heap, MD & Dr. Bonnetta Barry ref. provider found

## 2012-12-19 NOTE — H&P (Signed)
[name] is an 72 y.o. female.   Chief Complaint: Patient's here for EGD and ED. HPI: Patient is 72 year old African female with multiple medical problems including amyloidosis who presents with dysphagia to solids and pills. She had her esophagus dilated by me [facility] over 3 years ago. Although no structural abnormality was noted she responded dilation. Biopsy was negative for esophageal amyloidosis. She is maintaining her weight. 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  Past Medical History  Diagnosis Date  . History of CEA (carotid endarterectomy) 03/2007    Left CEA by Dr. Cari Caraway  . Hypertension   . Hyperlipidemia   . GERD (gastroesophageal reflux disease)   . Anemia in chronic kidney disease(285.21) 02/2009    She has been on Aranesp for ? anemia of chronic kidney disease though renal function does not seem severely impaired.  She had repeat bone marrow biopsy by Dr. Cleone Slim 9/10.  Marland Kitchen Episode of syncope     dysauthonomia/ orthostatic syncope - Patient has Medtronic loop recorder implanted 8/10.  No events so far - May be due only to orthostasis  . 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    . History of MRI 01/2009    CardiacMRI/EF 66%, mild to moderate concentric LVH, RV size & systolic function normAL w/o evidence for ARVC, trivial pericardial effusion/On delayed enhancement, it was difficult to obtain the TI/There was patchy mid-wall basal delayed enhancement/There was partially circumferential subendocardialdelayed enhancement in the mid to apical LV/This was suggestive of infiltrative cardiomyopathy versus  . History of CT scan of chest 01/2009    to assess for sarcoidosis: no evidence by CT for pulmonary sarcoid  . 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   . Secretory diarrhea    treated with octreotide.   . Esophageal stricture 05/2010    with dilation  . Amyloidosis   . Shortness of breath     when tired  . Cancer 2010    Cancer of the Blood stream    Past Surgical History  Procedure Laterality Date  . Implantable loop recorder  2010    MDT for unexplained syncope  . Partial colectomy  1/11    for diverticular perforation  . Carotid endarterectomy  10/08    left  . Abdominal hysterectomy    . Upper gastrointestinal endoscopy  05/20/2010    EGD ED  . Colonoscopy  03/14/2009  . Ercp  04/09/2011    Procedure: ENDOSCOPIC RETROGRADE CHOLANGIOPANCREATOGRAPHY (ERCP);  Surgeon: Malissa Hippo, MD;  Location: [facility];  Service: Endoscopy;  Laterality: N/A;  10:25  . Sphincterotomy  04/09/2011    Procedure: SPHINCTEROTOMY;  Surgeon: Malissa Hippo, MD;  Location: [facility];  Service: Endoscopy;  Laterality: N/A;  with removal of multiple pigment stones  . Loop recorder explant  Feb. 18, 2014    removed 3 months ago. at [facility]    Family History  Problem Relation Age of Onset  . Heart failure Mother   . Hypertension Mother   . Hyperlipidemia Mother   . Heart attack Mother   . Heart failure Father   . Anesthesia problems Neg Hx   . Hypotension Neg Hx   . Malignant hyperthermia Neg Hx   . Pseudochol deficiency Neg Hx    Social History:  reports that she  quit smoking about 34 years ago. Her smoking use included Cigarettes. She has a 5 pack-year smoking history. She has never used smokeless tobacco. She reports that she does not drink alcohol or use illicit drugs.  Allergies: No Known Allergies  Medications Prior to Admission  Medication Sig Dispense Refill  . amitriptyline (ELAVIL) 25 MG tablet Take 25 mg by mouth at bedtime.       Marland Kitchen aspirin EC 81 MG tablet Take 1 tablet (81 mg total) by mouth daily.      Marland Kitchen atorvastatin (LIPITOR) 40 MG tablet Take 40 mg by mouth daily.      . Calcium Carbonate-Vit D-Min 1200-1000 MG-UNIT CHEW Chew 1 tablet by  mouth 2 (two) times daily.      . diphenoxylate-atropine (LOMOTIL) 2.5-0.025 MG per tablet TAKE ONE TABLET BY MOUTH EVERY 6 HOURS AS NEEDED FOR DIARRHEA  120 tablet  0  . folic acid (FOLVITE) 1 MG tablet Take 1 mg by mouth daily.        Marland Kitchen gabapentin (NEURONTIN) 300 MG capsule Take 300 mg by mouth 3 (three) times daily.       Marland Kitchen HYDROcodone-acetaminophen (NORCO/VICODIN) 5-325 MG per tablet Take 1 tablet by mouth every 6 (six) hours as needed. For pain      . lipase/protease/amylase (CREON-10/PANCREASE) 12000 UNITS CPEP Take 1 capsule by mouth 3 (three) times daily.       Marland Kitchen loperamide (IMODIUM) 2 MG capsule Take 2 mg by mouth 4 (four) times daily as needed. For diarrhea       . megestrol (MEGACE) 40 MG/ML suspension Take 200 mg by mouth 2 (two) times daily as needed. For appetite control      . metoCLOPramide (REGLAN) 10 MG tablet Take 10 mg by mouth 2 (two) times daily.        . metoprolol succinate (TOPROL-XL) 100 MG 24 hr tablet Take 1 tablet (100 mg total) by mouth daily. Take with or immediately following a meal.  30 tablet  6  . Multiple Vitamin (MULTIVITAMIN) tablet Take 1 tablet by mouth daily.        Marland Kitchen nystatin-triamcinolone ointment (MYCOLOG) Apply topically 2 (two) times daily.  30 g  0  . ondansetron (ZOFRAN) 8 MG tablet Take 8 mg by mouth every 8 (eight) hours as needed. For nausea      . pantoprazole (PROTONIX) 40 MG tablet Take 40 mg by mouth 2 (two) times daily before a meal.      . zolpidem (AMBIEN) 10 MG tablet Take 5 mg by mouth at bedtime as needed. For sleep        No results found for this or any previous visit (from the past 48 hour(s)). No results found.  ROS  Blood pressure 162/90, pulse 75, temperature 98.1 F (36.7 C), temperature source Oral, resp. rate 16, SpO2 99.00%. Physical Exam  Constitutional:  Well-developed thin African American female in NAD  HENT:  Mouth/Throat: Oropharynx is clear and moist.  Eyes: Conjunctivae are normal. No scleral icterus.  Neck:  No thyromegaly present.  Cardiovascular: Normal rate and regular rhythm.  Gallop: grade 3/6 systolic ejection murmur best heard at LLSB and apex.   Murmur heard. GI: Soft. She exhibits no mass. There is no tenderness.  Colostomy located in the right side of abdomen  Musculoskeletal: She exhibits no edema.  Lymphadenopathy:    She has no cervical adenopathy.  Neurological: She is alert.  Skin: Skin is warm and dry.     Assessment/Plan Solid  food dysphagia. EGD and ED.  REHMAN,NAJEEB U 12/19/2012, 10:52 AM

## 2012-12-25 ENCOUNTER — Encounter (HOSPITAL_COMMUNITY): Payer: Self-pay | Admitting: Internal Medicine

## 2012-12-27 ENCOUNTER — Ambulatory Visit (INDEPENDENT_AMBULATORY_CARE_PROVIDER_SITE_OTHER): Payer: Medicare Other | Admitting: Internal Medicine

## 2012-12-27 ENCOUNTER — Encounter (INDEPENDENT_AMBULATORY_CARE_PROVIDER_SITE_OTHER): Payer: Self-pay | Admitting: Internal Medicine

## 2012-12-27 VITALS — BP 98/50 | HR 80 | Temp 98.3°F | Ht 63.0 in | Wt 105.0 lb

## 2012-12-27 DIAGNOSIS — R197 Diarrhea, unspecified: Secondary | ICD-10-CM

## 2012-12-27 DIAGNOSIS — K529 Noninfective gastroenteritis and colitis, unspecified: Secondary | ICD-10-CM

## 2012-12-27 DIAGNOSIS — R1310 Dysphagia, unspecified: Secondary | ICD-10-CM

## 2012-12-27 NOTE — Progress Notes (Signed)
Subjective:     Patient ID: [name], female   DOB: 1941/04/28, 72 y.o.   MRN: [id number]  HPI Here today for f/u of her dysphagia. Her swallow is much. She is able to most anything she wants. She does have problems with meats. She is eating 2 1/2 meals a day and snacks in-between. 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 Presently taking Creon for her diarrhea. Also takes Imodium and Lomotil on a prn basis.  on a prn basis. She is also on Sandostatin  once a month at the [facility] in [place] for her secretory diarrhea. She empties her bag 4-6 times a day.  Hx of primary amyloidosis.  Hx of c-difficile.  She is not having any side effects from the Reglan.   HPI  Colonoscopy 2010: Dr Karilyn Cota: pancolonic diverticulosis. Negative rectal biopsy for amyloidosis and focal colitis sigmoid colon which systologically was non-specific and felt to not to be source of diarrhea.     12/19/2012 EGD/ED: Dysphagia:  Impression:  Distal esophageal stricture.  Esophagus dilated by passing 48 and 52 [place] dilators.  Food debris in the stomach with patent pylorus secondary to known gastroparesis.  Abnormal appearance to gastric mucosa. Biopsy taken to rule out gastric amyloidosis.  Biopsy negative for amyloidosis.  CMP     Component Value Date/Time   NA 141 09/18/2012 1041   NA 140 08/16/2012 0355   NA 138 09/01/2010   K 4.8 09/18/2012 1041   K 3.2* 08/16/2012 0355   CL 106 09/18/2012 1041   CL 104 08/16/2012 0355   CO2 24 09/18/2012 1041   CO2 27 08/16/2012 0355   GLUCOSE 107* 09/18/2012 1041   GLUCOSE 108* 08/16/2012 0355   BUN 11.3 09/18/2012 1041   BUN 21 08/16/2012 0355   CREATININE 1.1 09/18/2012 1041   CREATININE 1.30* 08/16/2012 0355   CALCIUM 9.9 09/18/2012 1041   CALCIUM 8.4 08/16/2012 0355   PROT 7.8 09/18/2012 1041   PROT 7.4 08/15/2012 0016   ALBUMIN 4.3 09/18/2012 1041   ALBUMIN 4.2 08/15/2012 0016   AST 29 09/18/2012 1041   AST 22 08/15/2012  0016   ALT 17 09/18/2012 1041   ALT 16 08/15/2012 0016   ALKPHOS 112 09/18/2012 1041   ALKPHOS 104 08/15/2012 0016   BILITOT 0.68 09/18/2012 1041   BILITOT 0.4 08/15/2012 0016   GFRNONAA 40* 08/16/2012 0355   GFRAA 47* 08/16/2012 0355    CBC    Component Value Date/Time   WBC 4.0 09/18/2012 1041   WBC 3.2* 08/16/2012 0355   RBC 3.83 09/18/2012 1041   RBC 3.20* 08/16/2012 0355   HGB 11.2* 09/18/2012 1041   HGB 9.2* 08/16/2012 0355   HCT 34.1* 09/18/2012 1041   HCT 28.2* 08/16/2012 0355   PLT 148 09/18/2012 1041   PLT 108* 08/16/2012 0355   MCV 89.1 09/18/2012 1041   MCV 88.1 08/16/2012 0355   MCH 29.2 09/18/2012 1041   MCH 28.8 08/16/2012 0355   MCHC 32.8 09/18/2012 1041   MCHC 32.6 08/16/2012 0355   RDW 14.3 09/18/2012 1041   RDW 13.1 08/16/2012 0355   LYMPHSABS 1.2 09/18/2012 1041   LYMPHSABS 0.3* 08/15/2012 0016   MONOABS 0.3 09/18/2012 1041   MONOABS 0.2 08/15/2012 0016   EOSABS 0.1 09/18/2012 1041   EOSABS 0.0 08/15/2012 0016   BASOSABS 0.0 09/18/2012 1041   BASOSABS 0.0 08/15/2012 0016      Review of  Systems  See hpi   Current Outpatient Prescriptions  Medication Sig Dispense Refill  . amitriptyline (ELAVIL) 25 MG tablet Take 25 mg by mouth at bedtime.       Marland Kitchen aspirin EC 81 MG tablet Take 1 tablet (81 mg total) by mouth daily.      Marland Kitchen atorvastatin (LIPITOR) 40 MG tablet Take 40 mg by mouth daily.      . Calcium Carbonate-Vit D-Min 1200-1000 MG-UNIT CHEW Chew 1 tablet by mouth 2 (two) times daily.      . diphenoxylate-atropine (LOMOTIL) 2.5-0.025 MG per tablet TAKE ONE TABLET BY MOUTH EVERY 6 HOURS AS NEEDED FOR DIARRHEA  120 tablet  0  . folic acid (FOLVITE) 1 MG tablet Take 1 mg by mouth daily.        Marland Kitchen gabapentin (NEURONTIN) 300 MG capsule Take 300 mg by mouth 3 (three) times daily.       Marland Kitchen HYDROcodone-acetaminophen (NORCO/VICODIN) 5-325 MG per tablet Take 1 tablet by mouth every 6 (six) hours as needed. For pain      . lipase/protease/amylase (CREON-10/PANCREASE) 12000 UNITS CPEP Take 1 capsule by mouth 3 (three) times  daily.       Marland Kitchen loperamide (IMODIUM) 2 MG capsule Take 2 mg by mouth 4 (four) times daily as needed. For diarrhea       . megestrol (MEGACE) 40 MG/ML suspension Take 200 mg by mouth 2 (two) times daily as needed. For appetite control      . metoCLOPramide (REGLAN) 10 MG tablet Take 10 mg by mouth 2 (two) times daily.        . metoprolol succinate (TOPROL-XL) 100 MG 24 hr tablet Take 1 tablet (100 mg total) by mouth daily. Take with or immediately following a meal.  30 tablet  6  . Multiple Vitamin (MULTIVITAMIN) tablet Take 1 tablet by mouth daily.        Marland Kitchen nystatin-triamcinolone ointment (MYCOLOG) Apply topically 2 (two) times daily.  30 g  0  . ondansetron (ZOFRAN) 8 MG tablet Take 8 mg by mouth every 8 (eight) hours as needed. For nausea      . pantoprazole (PROTONIX) 40 MG tablet Take 40 mg by mouth 2 (two) times daily before a meal.      . zolpidem (AMBIEN) 10 MG tablet Take 5 mg by mouth at bedtime as needed. For sleep       No current facility-administered medications for this visit.   Past Medical History  Diagnosis Date  . History of CEA (carotid endarterectomy) 03/2007    Left CEA by Dr. Cari Caraway  . Hypertension   . Hyperlipidemia   . GERD (gastroesophageal reflux disease)   . Anemia in chronic kidney disease(285.21) 02/2009    She has been on Aranesp for ? anemia of chronic kidney disease though renal function does not seem severely impaired.  She had repeat bone marrow biopsy by Dr. Cleone Slim 9/10.  Marland Kitchen Episode of syncope     dysauthonomia/ orthostatic syncope - Patient has Medtronic loop recorder implanted 8/10.  No events so far - May be due only to orthostasis  . 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    . History of MRI 01/2009    CardiacMRI/EF 66%, mild to moderate concentric LVH, RV size & systolic function normAL w/o evidence for ARVC, trivial pericardial effusion/On delayed enhancement, it was difficult to obtain the TI/There was  patchy mid-wall basal delayed enhancement/There was partially circumferential  subendocardialdelayed enhancement in the mid to apical LV/This was suggestive of infiltrative cardiomyopathy versus  . History of CT scan of chest 01/2009    to assess for sarcoidosis: no evidence by CT for pulmonary sarcoid  . 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   . Secretory diarrhea      treated with octreotide.   . Esophageal stricture 05/2010    with dilation  . Amyloidosis   . Shortness of breath     when tired  . Cancer 2010    Cancer of the Blood stream   Past Surgical History  Procedure Laterality Date  . Implantable loop recorder  2010    MDT for unexplained syncope  . Partial colectomy  1/11    for diverticular perforation  . Carotid endarterectomy  10/08    left  . Abdominal hysterectomy    . Upper gastrointestinal endoscopy  05/20/2010    EGD ED  . Colonoscopy  03/14/2009  . Ercp  04/09/2011    Procedure: ENDOSCOPIC RETROGRADE CHOLANGIOPANCREATOGRAPHY (ERCP);  Surgeon: Malissa Hippo, MD;  Location: [facility];  Service: Endoscopy;  Laterality: N/A;  10:25  . Sphincterotomy  04/09/2011    Procedure: SPHINCTEROTOMY;  Surgeon: Malissa Hippo, MD;  Location: [facility];  Service: Endoscopy;  Laterality: N/A;  with removal of multiple pigment stones  . Loop recorder explant  Feb. 18, 2014    removed 3 months ago. at [facility]  . Esophagogastroduodenoscopy (egd) with esophageal dilation N/A 12/19/2012    Procedure: ESOPHAGOGASTRODUODENOSCOPY (EGD) WITH ESOPHAGEAL DILATION;  Surgeon: Malissa Hippo, MD;  Location: [facility];  Service: Endoscopy;  Laterality: N/A;  1030   No Known Allergies     Objective:   Physical Exam  Filed Vitals:   12/27/12 0919  BP: 98/50  Pulse: 80  Temp: 98.3 F (36.8 C)  Height: 5\' 3"  (1.6 m)  Weight: 105 lb (47.628 kg)   Alert and oriented. Skin  warm and dry. Oral mucosa is moist.   . Sclera anicteric, conjunctivae is pink. Thyroid not enlarged. No cervical lymphadenopathy. Lungs clear. Heart regular rate and rhythm.  Abdomen is soft. Bowel sounds are positive. No hepatomegaly. No abdominal masses felt. Ileostomy rt abdomen. No stool in bag. Fistula  left abdomen: no drainage.  No tenderness.  No edema to lower extremities.       Assessment:    Dysphagia which is much better now. Refent EGD/ED: distal esophageal stricture. Secretory diarrhea: Presently taking  Sandostatin monthly, Lomotil, and Imodium.    Plan:    OV in 6 months. If she has any problems she will call our office. 4-6 small meals daily.  Continue present medications.

## 2012-12-27 NOTE — Patient Instructions (Addendum)
OV in 6 months. 

## 2012-12-28 ENCOUNTER — Ambulatory Visit (INDEPENDENT_AMBULATORY_CARE_PROVIDER_SITE_OTHER): Payer: Medicare Other | Admitting: Internal Medicine

## 2013-01-09 ENCOUNTER — Telehealth: Payer: Self-pay | Admitting: Hematology and Oncology

## 2013-01-09 NOTE — Telephone Encounter (Signed)
called pt left message regarding appt on 7/30 and August 6th lab and MD

## 2013-01-10 ENCOUNTER — Other Ambulatory Visit (HOSPITAL_BASED_OUTPATIENT_CLINIC_OR_DEPARTMENT_OTHER): Payer: Medicare Other | Admitting: Lab

## 2013-01-10 DIAGNOSIS — E859 Amyloidosis, unspecified: Secondary | ICD-10-CM

## 2013-01-10 LAB — COMPREHENSIVE METABOLIC PANEL (CC13)
BUN: 16 mg/dL (ref 7.0–26.0)
CO2: 23 mEq/L (ref 22–29)
Creatinine: 1.2 mg/dL — ABNORMAL HIGH (ref 0.6–1.1)
Glucose: 103 mg/dl (ref 70–140)
Total Bilirubin: 0.57 mg/dL (ref 0.20–1.20)
Total Protein: 7.1 g/dL (ref 6.4–8.3)

## 2013-01-10 LAB — CBC WITH DIFFERENTIAL/PLATELET
BASO%: 0.3 % (ref 0.0–2.0)
HCT: 30.3 % — ABNORMAL LOW (ref 34.8–46.6)
MCHC: 33.5 g/dL (ref 31.5–36.0)
MONO#: 0.3 10*3/uL (ref 0.1–0.9)
NEUT#: 2 10*3/uL (ref 1.5–6.5)
NEUT%: 62 % (ref 38.4–76.8)
RBC: 3.38 10*6/uL — ABNORMAL LOW (ref 3.70–5.45)
WBC: 3.2 10*3/uL — ABNORMAL LOW (ref 3.9–10.3)
lymph#: 0.8 10*3/uL — ABNORMAL LOW (ref 0.9–3.3)

## 2013-01-11 LAB — KAPPA/LAMBDA LIGHT CHAINS
Kappa free light chain: 1 mg/dL (ref 0.33–1.94)
Kappa:Lambda Ratio: 0.08 — ABNORMAL LOW (ref 0.26–1.65)

## 2013-01-15 DIAGNOSIS — E859 Amyloidosis, unspecified: Secondary | ICD-10-CM

## 2013-01-15 DIAGNOSIS — E340 Carcinoid syndrome: Secondary | ICD-10-CM

## 2013-01-17 ENCOUNTER — Other Ambulatory Visit: Payer: Self-pay

## 2013-01-17 ENCOUNTER — Telehealth: Payer: Self-pay | Admitting: Hematology and Oncology

## 2013-01-17 ENCOUNTER — Other Ambulatory Visit: Payer: Medicare Other | Admitting: Lab

## 2013-01-17 ENCOUNTER — Ambulatory Visit (HOSPITAL_BASED_OUTPATIENT_CLINIC_OR_DEPARTMENT_OTHER): Payer: Medicare Other | Admitting: Hematology and Oncology

## 2013-01-17 VITALS — BP 88/55 | HR 91 | Temp 97.4°F | Resp 20 | Ht 63.0 in | Wt 106.3 lb

## 2013-01-17 DIAGNOSIS — E859 Amyloidosis, unspecified: Secondary | ICD-10-CM

## 2013-01-17 DIAGNOSIS — R197 Diarrhea, unspecified: Secondary | ICD-10-CM

## 2013-01-17 DIAGNOSIS — D638 Anemia in other chronic diseases classified elsewhere: Secondary | ICD-10-CM

## 2013-01-17 MED ORDER — DIPHENOXYLATE-ATROPINE 2.5-0.025 MG PO TABS: ORAL_TABLET | ORAL | Status: DC

## 2013-01-17 NOTE — Telephone Encounter (Signed)
gv and printed appt sched and avs for pt  °

## 2013-01-19 LAB — UIFE/LIGHT CHAINS/TP QN, 24-HR UR
Beta, Urine: DETECTED — AB
Free Lambda Lt Chains,Ur: 26.5 mg/dL — ABNORMAL HIGH (ref 0.02–0.67)
Free Lt Chn Excr Rate: 32.56 mg/d
Gamma Globulin, Urine: DETECTED — AB
Volume, Urine: 1100 mL

## 2013-02-01 ENCOUNTER — Ambulatory Visit (HOSPITAL_BASED_OUTPATIENT_CLINIC_OR_DEPARTMENT_OTHER): Payer: Medicare Other | Admitting: Hematology and Oncology

## 2013-02-01 ENCOUNTER — Telehealth: Payer: Self-pay | Admitting: Hematology and Oncology

## 2013-02-01 VITALS — BP 138/80 | HR 72 | Temp 97.9°F | Resp 18 | Ht 63.0 in | Wt 105.4 lb

## 2013-02-01 DIAGNOSIS — R197 Diarrhea, unspecified: Secondary | ICD-10-CM

## 2013-02-01 DIAGNOSIS — N189 Chronic kidney disease, unspecified: Secondary | ICD-10-CM

## 2013-02-01 DIAGNOSIS — D631 Anemia in chronic kidney disease: Secondary | ICD-10-CM

## 2013-02-01 DIAGNOSIS — E859 Amyloidosis, unspecified: Secondary | ICD-10-CM

## 2013-02-01 NOTE — Telephone Encounter (Signed)
gv and printed aptp sched and avs for pt...per Dr. Alecia Lemming lab with visit only

## 2013-02-01 NOTE — Progress Notes (Signed)
ID: [name] OB: 1941/02/01  MR#: [id number]  CSN#:[id number]   OFFICE PROGRESS NOTE    PCP: Rudi Heap, MD  DIAGNOSIS: systemic amyloidosis.   PAST THERAPY: underwent treatment with Velcade and dexamethasone. She had response with decrease in light chain. However, she developed intractable diarrhea with bowel perforations in 1/11 and 7/11. 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   CURRENT THERAPY: Watchful observation for myeloma. She was evaluated at [facility] and was not a candidate for BMT. She has been on Octreotide for chronic diarrhea.   INTERVAL HISTORY:  [name] 71 y.o. African American female returns for regular follow up visit. She reported mild night sweats and feeling of tiredness. Her appetite is good and weight is stable. Sometimes she has blurry vision. He reported that she had esophageal dilation on  12/19/2012. Patient has dyspnea on and off and continue to have the same nausea, abdominal pain, diarrhea and numbness in feet. The patient denied fever, chills,  change in appetite or weight. She denied headaches, double vision, nasal congestion, nasal discharge, hearing problems, odynophagia or dysphagia. No chest pain, palpitations, cough, vomiting, constipation, hematochezia. The patient denied dysuria, nocturia, polyuria, hematuria, myalgia, tingling, psychiatric problems.  Review of Systems  Constitutional: Negative for fever, chills, weight loss and diaphoresis.  HENT: Negative for hearing loss, nosebleeds, congestion, sore throat, neck pain, tinnitus and ear discharge.   Eyes: Positive for blurred vision. Negative for double vision, photophobia, pain, discharge and redness.  Respiratory: Positive for shortness of breath. Negative for cough, hemoptysis, sputum production and wheezing.   Cardiovascular: Negative for chest pain, palpitations, orthopnea, leg swelling and PND.  Gastrointestinal: Positive for nausea, abdominal pain and diarrhea. Negative  for heartburn, vomiting, constipation, blood in stool and melena.  Genitourinary: Negative for dysuria, urgency, frequency and hematuria.  Musculoskeletal: Negative for myalgias, back pain and joint pain.  Skin: Negative for itching and rash.  Neurological: Positive for sensory change. Negative for dizziness, focal weakness, seizures, loss of consciousness, weakness and headaches.  Endo/Heme/Allergies: Does not bruise/bleed easily.  Psychiatric/Behavioral: Negative.    PAST MEDICAL HISTORY: Past Medical History  Diagnosis Date  . History of CEA (carotid endarterectomy) 03/2007    Left CEA by Dr. Cari Caraway  . Hypertension   . Hyperlipidemia   . GERD (gastroesophageal reflux disease)   . Anemia in chronic kidney disease(285.21) 02/2009    She has been on Aranesp for ? anemia of chronic kidney disease though renal function does not seem severely impaired.  She had repeat bone marrow biopsy by Dr. Cleone Slim 9/10.  Marland Kitchen Episode of syncope     dysauthonomia/ orthostatic syncope - Patient has [employer] loop recorder implanted 8/10.  No events so far - May be due only to orthostasis  . 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    . History of MRI 01/2009    CardiacMRI/EF 66%, mild to moderate concentric LVH, RV size & systolic function normAL w/o evidence for ARVC, trivial pericardial effusion/On delayed enhancement, it was difficult to obtain the TI/There was patchy mid-wall basal delayed enhancement/There was partially circumferential subendocardialdelayed enhancement in the mid to apical LV/This was suggestive of infiltrative cardiomyopathy versus  . History of CT scan of chest 01/2009    to assess for sarcoidosis: no evidence by CT for pulmonary sarcoid  . 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  Recurrent bowel perforation in 7/11 with prolonged hospitalization and SNF  stay.   . Secretory diarrhea      treated with octreotide.   .  Esophageal stricture 05/2010    with dilation  . Amyloidosis   . Shortness of breath     when tired  . Cancer 2010    Cancer of the Blood stream    PAST SURGICAL HISTORY: Past Surgical History  Procedure Laterality Date  . Implantable loop recorder  2010    MDT for unexplained syncope  . Partial colectomy  1/11    for diverticular perforation  . Carotid endarterectomy  10/08    left  . Abdominal hysterectomy    . Upper gastrointestinal endoscopy  05/20/2010    EGD ED  . Colonoscopy  03/14/2009  . Ercp  04/09/2011    Procedure: ENDOSCOPIC RETROGRADE CHOLANGIOPANCREATOGRAPHY (ERCP);  Surgeon: Malissa Hippo, MD;  Location: [facility];  Service: Endoscopy;  Laterality: N/A;  10:25  . Sphincterotomy  04/09/2011    Procedure: SPHINCTEROTOMY;  Surgeon: Malissa Hippo, MD;  Location: [facility];  Service: Endoscopy;  Laterality: N/A;  with removal of multiple pigment stones  . Loop recorder explant  Feb. 18, 2014    removed 3 months ago. at [facility]  . Esophagogastroduodenoscopy (egd) with esophageal dilation N/A 12/19/2012    Procedure: ESOPHAGOGASTRODUODENOSCOPY (EGD) WITH ESOPHAGEAL DILATION;  Surgeon: Malissa Hippo, MD;  Location: [facility] SUITE;  Service: Endoscopy;  Laterality: N/A;  1030    FAMILY HISTORY Family History  Problem Relation Age of Onset  . Heart failure Mother   . Hypertension Mother   . Hyperlipidemia Mother   . Heart attack Mother   . Heart failure Father   . Anesthesia problems Neg Hx   . Hypotension Neg Hx   . Malignant hyperthermia Neg Hx   . Pseudochol deficiency Neg Hx    HEALTH MAINTENANCE: History  Substance Use Topics  . Smoking status: Former Smoker -- 1.00 packs/day for 5 years    Types: Cigarettes    Quit date: 09/07/1978  . Smokeless tobacco: Never Used  . Alcohol Use: No   No Known Allergies  Current Outpatient Prescriptions  Medication Sig Dispense Refill  . amitriptyline (ELAVIL) 25 MG tablet Take 25 mg by mouth at bedtime.        Marland Kitchen aspirin EC 81 MG tablet Take 1 tablet (81 mg total) by mouth daily.      Marland Kitchen atorvastatin (LIPITOR) 40 MG tablet Take 40 mg by mouth daily.      . Calcium Carbonate-Vit D-Min 1200-1000 MG-UNIT CHEW Chew 1 tablet by mouth 2 (two) times daily.      . folic acid (FOLVITE) 1 MG tablet Take 1 mg by mouth daily.        Marland Kitchen gabapentin (NEURONTIN) 300 MG capsule Take 300 mg by mouth 3 (three) times daily.       Marland Kitchen HYDROcodone-acetaminophen (NORCO/VICODIN) 5-325 MG per tablet Take 1 tablet by mouth every 6 (six) hours as needed. For pain      . lipase/protease/amylase (CREON-10/PANCREASE) 12000 UNITS CPEP Take 1 capsule by mouth 3 (three) times daily.       Marland Kitchen loperamide (IMODIUM) 2 MG capsule Take 2 mg by mouth 4 (four) times daily as needed. For diarrhea       . megestrol (MEGACE) 40 MG/ML suspension Take 200 mg by mouth 2 (two) times daily as needed. For appetite control      . metoCLOPramide (REGLAN) 10 MG tablet Take 10 mg by mouth 2 (two)  times daily.        . metoprolol succinate (TOPROL-XL) 100 MG 24 hr tablet Take 1 tablet (100 mg total) by mouth daily. Take with or immediately following a meal.  30 tablet  6  . Multiple Vitamin (MULTIVITAMIN) tablet Take 1 tablet by mouth daily.        Marland Kitchen nystatin-triamcinolone ointment (MYCOLOG) Apply topically 2 (two) times daily.  30 g  0  . ondansetron (ZOFRAN) 8 MG tablet Take 8 mg by mouth every 8 (eight) hours as needed. For nausea      . pantoprazole (PROTONIX) 40 MG tablet Take 40 mg by mouth 2 (two) times daily before a meal.      . zolpidem (AMBIEN) 10 MG tablet Take 5 mg by mouth at bedtime as needed. For sleep      . diphenoxylate-atropine (LOMOTIL) 2.5-0.025 MG per tablet TAKE ONE TABLET BY MOUTH EVERY 6 HOURS AS NEEDED FOR DIARRHEA  120 tablet  2   No current facility-administered medications for this visit.    OBJECTIVE: Filed Vitals:   01/17/13 1043  BP: 88/55  Pulse: 91  Temp: 97.4 F (36.3 C)  Resp: 20     Body mass index is 18.83  kg/(m^2).    ECOG FS: 2  HEENT: Sclerae anicteric.  Conjunctivae were pink. Pupils round and reactive bilaterally. Oral mucosa is moist without ulceration or thrush. No occipital, submandibular, cervical, supraclavicular or axillar adenopathy. Lungs: clear to auscultation without wheezes. No rales or rhonchi. Heart: regular rate and rhythm. No gallop or rubs. 2/6 systolic murmur Abdomen: soft, tender in left lower guadrant. No guarding or rebound tenderness. Bowel sounds are present. No palpable hepatosplenomegaly. Ostomy in place.  MSK: no focal spinal tenderness. Extremities: No clubbing or cyanosis. No calf tenderness to palpitation, no peripheral edema. The patient had grossly intact strength in upper and lower extremities Neuro: non-focal, alert and oriented to time, person and place, appropriate affect  LAB RESULTS:  CMP     Component Value Date/Time   NA 141 01/10/2013 0950   NA 140 08/16/2012 0355   NA 138 09/01/2010   K 4.8 01/10/2013 0950   K 3.2* 08/16/2012 0355   CL 106 09/18/2012 1041   CL 104 08/16/2012 0355   CO2 23 01/10/2013 0950   CO2 27 08/16/2012 0355   GLUCOSE 103 01/10/2013 0950   GLUCOSE 107* 09/18/2012 1041   GLUCOSE 108* 08/16/2012 0355   BUN 16.0 01/10/2013 0950   BUN 21 08/16/2012 0355   CREATININE 1.2* 01/10/2013 0950   CREATININE 1.30* 08/16/2012 0355   CALCIUM 9.8 01/10/2013 0950   CALCIUM 8.4 08/16/2012 0355   PROT 7.1 01/10/2013 0950   PROT 7.4 08/15/2012 0016   ALBUMIN 3.9 01/10/2013 0950   ALBUMIN 4.2 08/15/2012 0016   AST 29 01/10/2013 0950   AST 22 08/15/2012 0016   ALT 15 01/10/2013 0950   ALT 16 08/15/2012 0016   ALKPHOS 83 01/10/2013 0950   ALKPHOS 104 08/15/2012 0016   BILITOT 0.57 01/10/2013 0950   BILITOT 0.4 08/15/2012 0016   GFRNONAA 40* 08/16/2012 0355   GFRAA 47* 08/16/2012 0355    Lab Results  Component Value Date   WBC 3.2* 01/10/2013   NEUTROABS 2.0 01/10/2013   HGB 10.2* 01/10/2013   HCT 30.3* 01/10/2013   MCV 89.7 01/10/2013   PLT 140* 01/10/2013      Chemistry       Component Value Date/Time   NA 141 01/10/2013 0950   NA 140  08/16/2012 0355   NA 138 09/01/2010   K 4.8 01/10/2013 0950   K 3.2* 08/16/2012 0355   CL 106 09/18/2012 1041   CL 104 08/16/2012 0355   CO2 23 01/10/2013 0950   CO2 27 08/16/2012 0355   BUN 16.0 01/10/2013 0950   BUN 21 08/16/2012 0355   CREATININE 1.2* 01/10/2013 0950   CREATININE 1.30* 08/16/2012 0355   GLU 97 09/01/2010      Component Value Date/Time   CALCIUM 9.8 01/10/2013 0950   CALCIUM 8.4 08/16/2012 0355   ALKPHOS 83 01/10/2013 0950   ALKPHOS 104 08/15/2012 0016   AST 29 01/10/2013 0950   AST 22 08/15/2012 0016   ALT 15 01/10/2013 0950   ALT 16 08/15/2012 0016   BILITOT 0.57 01/10/2013 0950   BILITOT 0.4 08/15/2012 0016      Urinalysis    Component Value Date/Time   COLORURINE YELLOW 08/15/2012 0230   APPEARANCEUR CLOUDY* 08/15/2012 0230   LABSPEC 1.026 08/15/2012 0230   PHURINE 5.0 08/15/2012 0230   GLUCOSEU NEGATIVE 08/15/2012 0230   HGBUR TRACE* 08/15/2012 0230   BILIRUBINUR SMALL* 08/15/2012 0230   KETONESUR TRACE* 08/15/2012 0230   PROTEINUR 30* 08/15/2012 0230   UROBILINOGEN 0.2 08/15/2012 0230   NITRITE NEGATIVE 08/15/2012 0230   LEUKOCYTESUR NEGATIVE 08/15/2012 0230   ASSESSMENT AND PLAN:  1. Amyloidosis:  - She was een by Dr. Kathrene Bongo at [facility] and deemed not candidate for BMT given age and co-morbidities.  - Her serum lambda light chain was elevated 13,2 mg/dL I will check UIFE/TP. She has stable cardiac function without renal or hepatic failure. I would prefer observation for now since she tolerated therapy for amyloidosis poorly in the past unless her light chain significantly worsens. I will try to obtain medical record about pretreatment history.  2. 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  She has been on long acting octreotide 30 mg monthly with Dr. Cleone Slim in [place] with some control of the symptoms. She also on Lomotil and Imodium/ 3. Anemia of chronic disease: We will continue to observe.  4.  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 We will continue to monitor.  5. Congestive heart failure: Compensated. Continue  follow up Cardiology. . 6.  Positive left lower abdominal  tenderness on palpation. We will follow.  7. Follow up: In about 2 weeks given positive lambda light chain.   The length of time of the face-to-face encounter was 25 minutes. More than 50% of time was spent counseling and coordination of care.     Myra Rude, MD   01/18/2013  7:37 AM

## 2013-02-06 ENCOUNTER — Telehealth (INDEPENDENT_AMBULATORY_CARE_PROVIDER_SITE_OTHER): Payer: Self-pay | Admitting: *Deleted

## 2013-02-06 NOTE — Telephone Encounter (Signed)
Need samples of Creon. Dionne's return phone number is [phone].

## 2013-02-08 NOTE — Telephone Encounter (Signed)
If they are score, can she split them?

## 2013-02-08 NOTE — Telephone Encounter (Signed)
Patient was called and she is taking 16109 units three times a day. Samples given per Dr.Rehman's instruction. She will have someone to come and pick up the samples.

## 2013-02-08 NOTE — Telephone Encounter (Signed)
We have some samples but they are not the units that the patient is currently taking. I will check with Delrae Rend and see what we can do.  Terri we have the 24,000 untis, on her medication list it is Creon 10/Pancrease 12000 units.  Please advise what to do as far as dispensing the samples.

## 2013-02-08 NOTE — Telephone Encounter (Signed)
This will be okay. 

## 2013-02-08 NOTE — Telephone Encounter (Signed)
Per Dr.Rehman the patient should take 2 with each meal and 1 with snack, patient called and made aware.

## 2013-02-13 ENCOUNTER — Ambulatory Visit (HOSPITAL_BASED_OUTPATIENT_CLINIC_OR_DEPARTMENT_OTHER): Payer: Medicare Other

## 2013-02-13 ENCOUNTER — Encounter: Payer: Self-pay | Admitting: Hematology and Oncology

## 2013-02-13 VITALS — BP 115/73 | HR 81 | Temp 98.3°F

## 2013-02-13 DIAGNOSIS — E859 Amyloidosis, unspecified: Secondary | ICD-10-CM

## 2013-02-13 DIAGNOSIS — K529 Noninfective gastroenteritis and colitis, unspecified: Secondary | ICD-10-CM

## 2013-02-13 DIAGNOSIS — R197 Diarrhea, unspecified: Secondary | ICD-10-CM

## 2013-02-13 MED ORDER — OCTREOTIDE ACETATE 30 MG IM KIT
30.0000 mg | PACK | Freq: Once | INTRAMUSCULAR | Status: AC
  Administered 2013-02-13: 30 mg via INTRAMUSCULAR
  Filled 2013-02-13: qty 1

## 2013-02-13 NOTE — Progress Notes (Signed)
Patient wants to see if asst is available for Octreotide   [id number]-. She has [employer]/medicare. She gives the ok to sign her name if an application. I will call her if any problems.

## 2013-02-18 NOTE — Progress Notes (Signed)
ID: [name] OB: [birth year]  MR#: [id number]  WUJ#:[id number]  [facility]  [phone] [phone]   OFFICE PROGRESS NOTE  PCP: Rudi Heap, MD  DIAGNOSIS: systemic amyloidosis.   PAST THERAPY: underwent treatment with Velcade and dexamethasone. She had response with decrease in light chain. However, she developed intractable diarrhea with bowel perforations in 1/11 and 7/11. 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   CURRENT THERAPY: Watchful observation for myeloma. She was evaluated at [facility] and was not a candidate for BMT. She has been on Octreotide for chronic diarrhea.   INTERVAL HISTORY:  [name] 72 y.o. African American female returns for regular follow up visit. She reported that she has memory problems, continue to have numbness on right side of her leg. Her appetite is good and weight is stable. .Patient continues to have the same nausea, abdominal pain, diarrhea.  The patient denied fever, chills, night sweats. She denied headaches, double vision, blurry vision, nasal congestion, nasal discharge,  odynophagia or dysphagia. No chest pain, palpitations, dyspnea, cough, vomiting, constipation, hematochezia. The patient denied dysuria, nocturia, polyuria, hematuria, myalgia, tingling, psychiatric problems.  Review of Systems  Constitutional: Negative for fever, chills, weight loss, malaise/fatigue and diaphoresis.  HENT: Negative for ear pain, nosebleeds, congestion, sore throat and neck pain.   Eyes: Negative for blurred vision, double vision and photophobia.  Respiratory: Negative for cough, hemoptysis and shortness of breath.   Cardiovascular: Negative for chest pain, palpitations, orthopnea and claudication.  Gastrointestinal: Positive for nausea, abdominal pain and diarrhea. Negative for heartburn, vomiting, constipation and blood in stool.  Genitourinary: Negative for dysuria, urgency, frequency and hematuria.    Musculoskeletal: Negative for myalgias, back pain and joint pain.  Skin: Negative for itching and rash.  Neurological: Positive for sensory change. Negative for dizziness, tingling, tremors, focal weakness, seizures, loss of consciousness, weakness and headaches.  Psychiatric/Behavioral: Negative.     PAST MEDICAL HISTORY: Past Medical History  Diagnosis Date  . History of CEA (carotid endarterectomy) 03/2007    Left CEA by Dr. Cari Caraway  . Hypertension   . Hyperlipidemia   . GERD (gastroesophageal reflux disease)   . Anemia in chronic kidney disease(285.21) 02/2009    She has been on Aranesp for ? anemia of chronic kidney disease though renal function does not seem severely impaired.  She had repeat bone marrow biopsy by Dr. Cleone Slim 9/10.  Marland Kitchen Episode of syncope     dysauthonomia/ orthostatic syncope - Patient has Medtronic loop recorder implanted 8/10.  No events so far - May be due only to orthostasis  . 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    . History of MRI 01/2009    CardiacMRI/EF 66%, mild to moderate concentric LVH, RV size & systolic function normAL w/o evidence for ARVC, trivial pericardial effusion/On delayed enhancement, it was difficult to obtain the TI/There was patchy mid-wall basal delayed enhancement/There was partially circumferential subendocardialdelayed enhancement in the mid to apical LV/This was suggestive of infiltrative cardiomyopathy versus  . History of CT scan of chest 01/2009    to assess for sarcoidosis: no evidence by CT for pulmonary sarcoid  . 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   . Secretory diarrhea      treated with octreotide.   . Esophageal stricture 05/2010    with dilation  .  Amyloidosis   . Shortness of breath     when tired  . Cancer 2010    Cancer of the Blood stream    PAST SURGICAL  HISTORY: Past Surgical History  Procedure Laterality Date  . Implantable loop recorder  2010    MDT for unexplained syncope  . Partial colectomy  1/11    for diverticular perforation  . Carotid endarterectomy  10/08    left  . Abdominal hysterectomy    . Upper gastrointestinal endoscopy  05/20/2010    EGD ED  . Colonoscopy  03/14/2009  . Ercp  04/09/2011    Procedure: ENDOSCOPIC RETROGRADE CHOLANGIOPANCREATOGRAPHY (ERCP);  Surgeon: Malissa Hippo, MD;  Location: [facility];  Service: Endoscopy;  Laterality: N/A;  10:25  . Sphincterotomy  04/09/2011    Procedure: SPHINCTEROTOMY;  Surgeon: Malissa Hippo, MD;  Location: [facility];  Service: Endoscopy;  Laterality: N/A;  with removal of multiple pigment stones  . Loop recorder explant  Feb. 18, 2014    removed 3 months ago. at [facility]  . Esophagogastroduodenoscopy (egd) with esophageal dilation N/A 12/19/2012    Procedure: ESOPHAGOGASTRODUODENOSCOPY (EGD) WITH ESOPHAGEAL DILATION;  Surgeon: Malissa Hippo, MD;  Location: [facility] END[facility] SUITE;  Service: Endoscopy;  Laterality: N/A;  1030    FAMILY HISTORY Family History  Problem Relation Age of Onset  . Heart failure Mother   . Hypertension Mother   . Hyperlipidemia Mother   . Heart attack Mother   . Heart failure Father   . Anesthesia problems Neg Hx   . Hypotension Neg Hx   . Malignant hyperthermia Neg Hx   . Pseudochol deficiency Neg Hx     HEALTH MAINTENANCE: History  Substance Use Topics  . Smoking status: Former Smoker -- 1.00 packs/day for 5 years    Types: Cigarettes    Quit date: 09/07/1978  . Smokeless tobacco: Never Used  . Alcohol Use: No    No Known Allergies  Current Outpatient Prescriptions  Medication Sig Dispense Refill  . amitriptyline (ELAVIL) 25 MG tablet Take 25 mg by mouth at bedtime.       Marland Kitchen aspirin EC 81 MG tablet Take 1 tablet (81 mg total) by mouth daily.      Marland Kitchen atorvastatin (LIPITOR) 40 MG tablet Take 40 mg by mouth daily.      . Calcium  Carbonate-Vit D-Min 1200-1000 MG-UNIT CHEW Chew 1 tablet by mouth 2 (two) times daily.      . diphenoxylate-atropine (LOMOTIL) 2.5-0.025 MG per tablet TAKE ONE TABLET BY MOUTH EVERY 6 HOURS AS NEEDED FOR DIARRHEA  120 tablet  2  . folic acid (FOLVITE) 1 MG tablet Take 1 mg by mouth daily.        Marland Kitchen gabapentin (NEURONTIN) 300 MG capsule Take 300 mg by mouth 3 (three) times daily.       Marland Kitchen HYDROcodone-acetaminophen (NORCO/VICODIN) 5-325 MG per tablet Take 1 tablet by mouth every 6 (six) hours as needed. For pain      . lipase/protease/amylase (CREON-10/PANCREASE) 12000 UNITS CPEP Take 1 capsule by mouth 3 (three) times daily.       Marland Kitchen loperamide (IMODIUM) 2 MG capsule Take 2 mg by mouth 4 (four) times daily as needed. For diarrhea       . megestrol (MEGACE) 40 MG/ML suspension Take 200 mg by mouth 2 (two) times daily as needed. For appetite control      . metoCLOPramide (REGLAN) 10 MG tablet Take 10 mg by mouth 2 (two)  times daily.        . metoprolol succinate (TOPROL-XL) 100 MG 24 hr tablet Take 1 tablet (100 mg total) by mouth daily. Take with or immediately following a meal.  30 tablet  6  . Multiple Vitamin (MULTIVITAMIN) tablet Take 1 tablet by mouth daily.        Marland Kitchen nystatin-triamcinolone ointment (MYCOLOG) Apply topically 2 (two) times daily.  30 g  0  . ondansetron (ZOFRAN) 8 MG tablet Take 8 mg by mouth every 8 (eight) hours as needed. For nausea      . oxyCODONE-acetaminophen (PERCOCET/ROXICET) 5-325 MG per tablet Take as directed      . pantoprazole (PROTONIX) 40 MG tablet Take 40 mg by mouth 2 (two) times daily before a meal.      . zolpidem (AMBIEN) 10 MG tablet Take 5 mg by mouth at bedtime as needed. For sleep       No current facility-administered medications for this visit.    OBJECTIVE: Filed Vitals:   02/01/13 0938  BP: 138/80  Pulse: 72  Temp: 97.9 F (36.6 C)  Resp: 18     Body mass index is 18.68 kg/(m^2).      PHYSICAL EXAMINATION:  HEENT: Sclerae anicteric.   Conjunctivae were pink. Pupils round and reactive bilaterally. Oral mucosa is moist without ulceration or thrush. No occipital, submandibular, cervical, supraclavicular or axillar adenopathy. Lungs: clear to auscultation without wheezes. No rales or rhonchi. Heart: regular rate and rhythm. No murmur, gallop or rubs. Abdomen: soft, non tender. No guarding or rebound tenderness. Bowel sounds are present. No palpable hepatosplenomegaly. MSK: no focal spinal tenderness. Extremities: No clubbing or cyanosis.No calf tenderness to palpitation, no peripheral edema. The patient had grossly intact strength in upper and lower extremities. Skin exam was without ecchymosis, petechiae. Neuro: non-focal, alert and oriented to time, person and place, appropriate affect  LAB RESULTS:  CMP     Component Value Date/Time   NA 141 01/10/2013 0950   NA 140 08/16/2012 0355   NA 138 09/01/2010   K 4.8 01/10/2013 0950   K 3.2* 08/16/2012 0355   CL 106 09/18/2012 1041   CL 104 08/16/2012 0355   CO2 23 01/10/2013 0950   CO2 27 08/16/2012 0355   GLUCOSE 103 01/10/2013 0950   GLUCOSE 107* 09/18/2012 1041   GLUCOSE 108* 08/16/2012 0355   BUN 16.0 01/10/2013 0950   BUN 21 08/16/2012 0355   CREATININE 1.2* 01/10/2013 0950   CREATININE 1.30* 08/16/2012 0355   CALCIUM 9.8 01/10/2013 0950   CALCIUM 8.4 08/16/2012 0355   PROT 7.1 01/10/2013 0950   PROT 7.4 08/15/2012 0016   ALBUMIN 3.9 01/10/2013 0950   ALBUMIN 4.2 08/15/2012 0016   AST 29 01/10/2013 0950   AST 22 08/15/2012 0016   ALT 15 01/10/2013 0950   ALT 16 08/15/2012 0016   ALKPHOS 83 01/10/2013 0950   ALKPHOS 104 08/15/2012 0016   BILITOT 0.57 01/10/2013 0950   BILITOT 0.4 08/15/2012 0016   GFRNONAA 40* 08/16/2012 0355   GFRAA 47* 08/16/2012 0355    I No results found for this basename: SPEP, UPEP,  kappa and lambda light chains    Lab Results  Component Value Date   WBC 3.2* 01/10/2013   NEUTROABS 2.0 01/10/2013   HGB 10.2* 01/10/2013   HCT 30.3* 01/10/2013   MCV 89.7 01/10/2013   PLT  140* 01/10/2013      Chemistry      Component Value Date/Time   NA 141 01/10/2013  0950   NA 140 08/16/2012 0355   NA 138 09/01/2010   K 4.8 01/10/2013 0950   K 3.2* 08/16/2012 0355   CL 106 09/18/2012 1041   CL 104 08/16/2012 0355   CO2 23 01/10/2013 0950   CO2 27 08/16/2012 0355   BUN 16.0 01/10/2013 0950   BUN 21 08/16/2012 0355   CREATININE 1.2* 01/10/2013 0950   CREATININE 1.30* 08/16/2012 0355   GLU 97 09/01/2010      Component Value Date/Time   CALCIUM 9.8 01/10/2013 0950   CALCIUM 8.4 08/16/2012 0355   ALKPHOS 83 01/10/2013 0950   ALKPHOS 104 08/15/2012 0016   AST 29 01/10/2013 0950   AST 22 08/15/2012 0016   ALT 15 01/10/2013 0950   ALT 16 08/15/2012 0016   BILITOT 0.57 01/10/2013 0950   BILITOT 0.4 08/15/2012 0016      STUDIES: No results found.  ASSESSMENT AND PLAN: 1. Amyloidosis:  - She was seen by Dr. Kathrene Bongo at [facility] and deemed not candidate for BMT given age and co-morbidities.  - Her serum lambda light chain was elevated 13,2 mg/dL Her UIFE/TP from 06/19/08 did not significantly changed in last 8 month as well as CBC... She has stable cardiac function without renal or hepatic failure. I would prefer observation for now since she tolerated therapy for amyloidosis poorly in the past unless her light chain significantly worsens.This is was discussed with patient and she agreed with observation. 2. 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 She has been on long acting octreotide 30 mg monthly with Dr. Cleone Slim in [place] with some control of the symptoms. She also on Lomotil and Imodium. She can transfer octreotide treatment to [place] if she will wish. 3. Anemia of chronic disease: We will continue to observe.  4. 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 We will continue to monitor.  5. Congestive heart failure: Compensated. Continue follow up Cardiology. .  6. Follow up: In about 2 months with blood work.Myra Rude, MD   02/03/2013 12:02  PM

## 2013-02-19 ENCOUNTER — Telehealth (INDEPENDENT_AMBULATORY_CARE_PROVIDER_SITE_OTHER): Payer: Self-pay | Admitting: *Deleted

## 2013-02-19 MED ORDER — NYSTATIN-TRIAMCINOLONE 100000-0.1 UNIT/GM-% EX OINT: TOPICAL_OINTMENT | Freq: Two times a day (BID) | CUTANEOUS | Status: DC

## 2013-02-19 NOTE — Telephone Encounter (Signed)
Needs nystatin-triamcinolone ointment refiled.

## 2013-02-19 NOTE — Telephone Encounter (Signed)
She has rawness around her stoma. She has has some diarrhea.

## 2013-03-05 ENCOUNTER — Other Ambulatory Visit: Payer: Self-pay | Admitting: Hematology and Oncology

## 2013-03-05 DIAGNOSIS — R55 Syncope and collapse: Secondary | ICD-10-CM

## 2013-03-05 DIAGNOSIS — E859 Amyloidosis, unspecified: Secondary | ICD-10-CM

## 2013-03-05 MED ORDER — HYDROCODONE-ACETAMINOPHEN 5-325 MG PO TABS: 1.0000 | ORAL_TABLET | Freq: Four times a day (QID) | ORAL | Status: DC | PRN

## 2013-03-05 MED ORDER — GABAPENTIN 300 MG PO CAPS: 300.0000 mg | ORAL_CAPSULE | Freq: Three times a day (TID) | ORAL | Status: DC

## 2013-03-13 ENCOUNTER — Ambulatory Visit (HOSPITAL_BASED_OUTPATIENT_CLINIC_OR_DEPARTMENT_OTHER): Payer: Medicare Other

## 2013-03-13 VITALS — BP 120/69 | HR 82 | Temp 98.3°F

## 2013-03-13 DIAGNOSIS — K529 Noninfective gastroenteritis and colitis, unspecified: Secondary | ICD-10-CM

## 2013-03-13 DIAGNOSIS — E859 Amyloidosis, unspecified: Secondary | ICD-10-CM

## 2013-03-13 DIAGNOSIS — E3400 Carcinoid syndrome, unspecified: Secondary | ICD-10-CM

## 2013-03-13 MED ORDER — OCTREOTIDE ACETATE 30 MG IM KIT
30.0000 mg | PACK | Freq: Once | INTRAMUSCULAR | Status: AC
  Administered 2013-03-13: 30 mg via INTRAMUSCULAR
  Filled 2013-03-13: qty 1

## 2013-03-19 ENCOUNTER — Other Ambulatory Visit (INDEPENDENT_AMBULATORY_CARE_PROVIDER_SITE_OTHER): Payer: Self-pay | Admitting: Internal Medicine

## 2013-03-19 DIAGNOSIS — K219 Gastro-esophageal reflux disease without esophagitis: Secondary | ICD-10-CM

## 2013-03-19 MED ORDER — PANTOPRAZOLE SODIUM 40 MG PO TBEC: 40.0000 mg | DELAYED_RELEASE_TABLET | Freq: Two times a day (BID) | ORAL | Status: DC

## 2013-04-03 ENCOUNTER — Telehealth: Payer: Self-pay | Admitting: Hematology and Oncology

## 2013-04-03 NOTE — Telephone Encounter (Signed)
Moved 10/28 appt to NG. lmonvm for pt re new time for 10/28 lb/NG/inj @ 11:30am. Schedule mailed.

## 2013-04-05 ENCOUNTER — Ambulatory Visit (INDEPENDENT_AMBULATORY_CARE_PROVIDER_SITE_OTHER): Payer: Medicare Other | Admitting: Cardiology

## 2013-04-05 ENCOUNTER — Encounter: Payer: Self-pay | Admitting: Cardiology

## 2013-04-05 ENCOUNTER — Telehealth: Payer: Self-pay | Admitting: *Deleted

## 2013-04-05 ENCOUNTER — Other Ambulatory Visit: Payer: Self-pay | Admitting: Hematology and Oncology

## 2013-04-05 VITALS — BP 132/80 | HR 71 | Ht 63.0 in | Wt 101.0 lb

## 2013-04-05 DIAGNOSIS — R42 Dizziness and giddiness: Secondary | ICD-10-CM

## 2013-04-05 DIAGNOSIS — I5032 Chronic diastolic (congestive) heart failure: Secondary | ICD-10-CM

## 2013-04-05 DIAGNOSIS — E639 Nutritional deficiency, unspecified: Secondary | ICD-10-CM

## 2013-04-05 DIAGNOSIS — R55 Syncope and collapse: Secondary | ICD-10-CM

## 2013-04-05 DIAGNOSIS — E859 Amyloidosis, unspecified: Secondary | ICD-10-CM

## 2013-04-05 DIAGNOSIS — I6529 Occlusion and stenosis of unspecified carotid artery: Secondary | ICD-10-CM

## 2013-04-05 DIAGNOSIS — E785 Hyperlipidemia, unspecified: Secondary | ICD-10-CM

## 2013-04-05 NOTE — Progress Notes (Signed)
Patient ID: [name], female   DOB: [birth year], 72 y.o.   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  She underwent treatment with Velcade and dexamethasone for systemic amyloidosis.  Monoclonal light chain count fell with treatment, but she developed intractable diarrhea with bowel perforations in 1/11 and 7/11.  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  Last echo in 12/13 showed mild LVH, EF 55-60% (LV mid cavity gradient was not reported on this echo).  She was evaluated at [facility] for bone marrow transplant but turned down because of comorbidities.   She has stable dyspnea after walking 50 feet.  No dyspnea walking around her house.  No chest pain.  She is not getting lightheaded as much as in the past.  She knows to stand up slowly.  She had one syncopal episode several months ago, thinks she was dehydrated at that time.  She walks with a cane.  Occasional atypical chest pain but no exertional chest pain.    Labs (7/10): LDL 87, HDL 52, ALT 46, AST 33 Labs (8/10): K 4.4, creatinine 1.0, BNP 119, HCT 30.6, SPEP negative, UPEP positive for monoclonal lambda light chains.  Labs (9/10): creatinine 0.93, C diff negative  Labs (4/11): HDL 58, LDL 127, K 3.5, creatinine 0.8 Labs (3/12): K 4.9, creatinine 1.04, LDL 69, HDL 45 Labs (10/12): K 4.4, creatinine 1.23 Labs (11/12): LDL 54, HDL 54 Labs (6/13): LDL 76, HDL 61, BNP 15.7, K 5, creatinine 1.07 Labs (4/14): K 4.8, creatinine 0.68, LDL 87, HDL 46 Labs (7/14): K 4.8, creatinine 1.2  Allergies (verified):  No Known Drug Allergies  Past Medical History: 1.  Left CEA (10/08). Carotid dopplers 2/13 with 60-79% RICA stenosis, LICA ok s/p CEA. This is followed at [facility].   Carotid dopplers (3/14) with 40-59% RICA stenosis.  2.  HTN 3.  GERD 4.  Hyperlipidemia 5.  Anemia:  She has been on Aranesp for ? anemia  of chronic kidney disease though renal function does not seem severely impaired.  She had repeat bone marrow biopsy by Dr. Cleone Slim 9/10. 6.  s/p hysterectomy 7.  Mid-cavity dynamic LV gradient:  Echo (4/11) with EF 65-70%, severe concentric LVH, peak LV mid-cavity gradient reaching 45 mmHg with valsalva, grade I diastolic dysfunction, RV hypertrophy, small pericardial effusion.  Echo (5/12) with mild LVH, EF 65-70%, PASP 40 mmHg.  Echo (12/13) with EF 55-60%, mild LVH, PA systolic pressure 31 mmHg, trivial pericardial effusion.  8.  Adenosine myoview (5/10):  No evidence for ischemia or infarction.  9.  Episodes of syncope and presyncope.  - Patient has Medtronic loop recorder implanted 8/10.  No events recorded.  Loop recorder explanted in 2014. - May be due only to orthostasis 10. Cardiac amyloidosis - Significant LVH by initial echo.   - Cardiac MRI (8/10): EF 66%, mild to moderate concentric LVH, RV size and systolic function normal with no evidence for ARVC, trivial pericardial effusion.  On delayed enhancement, it was difficult to obtain the TI.  There was patchy mid-wall basal delayed enhancement.  There was partially circumferential subendocardial delayed enhancement in the mid to apical LV.  This was suggestive of infiltrative cardiomyopathy versus possible HCM.  - SPEP negative.  UPEP positive for monoclonal lambda light chains.  Monoclonal light chains confirmed on testing by Dr. Cleone Slim.  - CT chest (8/10) to assess for sarcoidosis: no evidence  by CT for pulmonary sarcoid.  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  - Evaluation at [facility], turned down for bone marrow transplant due to comorbidities.  11.  C. difficile colitis  12. Diverticulitis (1/11) with perforation and colectomy/colostomy.  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  14. Esophageal stricture with  dilation 06/11/23  Family History: Mother died suddenly with presumed MI at 61.  Father died suddenly with presumed MI at age 37.  Cousin died suddenly, ? age.   Social History: Quit tobacco in 1980.  No ETOH or drugs.  Single, lives in [place], 1 child (daughter in [place]).  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   Current Outpatient Prescriptions  Medication Sig Dispense Refill  . amitriptyline (ELAVIL) 25 MG tablet Take 25 mg by mouth at bedtime.       Marland Kitchen aspirin EC 81 MG tablet Take 1 tablet (81 mg total) by mouth daily.      Marland Kitchen atorvastatin (LIPITOR) 40 MG tablet Take 40 mg by mouth daily.      . Calcium Carbonate-Vit D-Min 1200-1000 MG-UNIT CHEW Chew 1 tablet by mouth 2 (two) times daily.      . diphenoxylate-atropine (LOMOTIL) 2.5-0.025 MG per tablet TAKE ONE TABLET BY MOUTH EVERY 6 HOURS AS NEEDED FOR DIARRHEA  120 tablet  2  . folic acid (FOLVITE) 1 MG tablet Take 1 mg by mouth daily.        Marland Kitchen gabapentin (NEURONTIN) 300 MG capsule Take 1 capsule (300 mg total) by mouth 3 (three) times daily.  90 capsule  6  . HYDROcodone-acetaminophen (NORCO/VICODIN) 5-325 MG per tablet Take 1 tablet by mouth every 6 (six) hours as needed. For pain  90 tablet  0  . lipase/protease/amylase (CREON-10/PANCREASE) 12000 UNITS CPEP Take 1 capsule by mouth 3 (three) times daily.       Marland Kitchen loperamide (IMODIUM) 2 MG capsule Take 2 mg by mouth 4 (four) times daily as needed. For diarrhea       . megestrol (MEGACE) 40 MG/ML suspension Take 200 mg by mouth 2 (two) times daily as needed. For appetite control      . metoCLOPramide (REGLAN) 10 MG tablet Take 10 mg by mouth 2 (two) times daily.        . metoprolol succinate (TOPROL-XL) 100 MG 24 hr tablet Take 1 tablet (100 mg total) by mouth daily. Take with or immediately following a meal.  30 tablet  6  . Multiple Vitamin (MULTIVITAMIN) tablet Take 1 tablet by mouth daily.        Marland Kitchen nystatin-triamcinolone ointment (MYCOLOG) Apply topically 2 (two) times daily.  30 g  0  .  ondansetron (ZOFRAN) 8 MG tablet Take 8 mg by mouth every 8 (eight) hours as needed. For nausea      . oxyCODONE-acetaminophen (PERCOCET/ROXICET) 5-325 MG per tablet Take as directed      . pantoprazole (PROTONIX) 40 MG tablet Take 1 tablet (40 mg total) by mouth 2 (two) times daily before a meal.  60 tablet  2  . zolpidem (AMBIEN) 10 MG tablet Take 5 mg by mouth at bedtime as needed. For sleep       No current facility-administered medications for this visit.    BP 132/80  Pulse 71  Ht 5\' 3"  (1.6 m)  Wt 45.813 kg (101 lb)  BMI 17.9 kg/m2 General:  Thin, no apparent distress Neck:  Neck  supple, no JVD. No masses, thyromegaly or abnormal cervical nodes. Lungs:  Distant BS, no crackles.  Heart:  Non-displaced PMI, chest non-tender; regular rate and rhythm, S1, S2, 1/6 systolic crescendo-decrescendo mid-peaking systolic murmur along sternal border. No S3/S4.  Bilateral carotid bruits. Pedals normal pulses. No edema.  Abdomen:  Bowel sounds positive; abdomen soft and non-tender without masses, organomegaly, or hernias noted. No hepatosplenomegaly.  Extremities:  No clubbing or cyanosis. Neurologic:  Alert and oriented x 3. Psych:  Normal affect.  Assessment/Plan:  CARDIOMYOPATHY, AMYLOID  Patient has a cardiomyopathy with preserved LV systolic function. Delayed enhancement on MRI suggested an infiltrative disease or hypertrophic cardiomyopathy rather than coronary disease, and she has had a negative myoview. 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 This was confirmed by testing at Dr. Bertha Stakes office as well. CT chest was not suggestive of pulmonary sarcoidosis. Given the finding of monoclonal light chains and suggestive MRI findings, she was treated for amyloidosis by Dr. Cleone Slim with Velcade and dexamethasone. Unfortunately, she developed secretory diarrhea and had 2 bowel perforations. Therefore, she is off treatment now. She  was evaluated at [facility] for treatment of her amyloidosis but turned down for bone marrow transplant. - No Lasix as she does not appear volume overloaded and this could worsen her mid-cavity gradient and orthostasis.  - Continue current dose of Toprol XL as this may help decrease LV mid-cavity gradient (past echoes have shown dynamic LV mid-cavity gradient and she has a prominent systolic murmur on exam).  Carotid stenosis Stable carotid disease, followed at [facility].  HYPERLIPIDEMIA  Continue statin given known vascular disease (carotid stenosis).  Good lipids in 4/14.   ORTHOSTATIC DIZZINESS I think this is primarily due to dehydration in the setting of secretory diarrhea, probably made worse by LV mid-cavity gradient.  Needs to stay hydrated. No arrhythmias on loop recorder (now explanted) => doubt her symptoms are arrhythmia-related.   Marca Ancona 04/05/2013

## 2013-04-05 NOTE — Patient Instructions (Signed)
Your physician wants you to follow-up in:  6 months. You will receive a reminder letter in the mail two months in advance. If you don't receive a letter, please call our office to schedule the follow-up appointment.   

## 2013-04-05 NOTE — Telephone Encounter (Signed)
Pt wants to p/u her 24 hr urine collection jug today so she can return on her visit next week.  Lab aware pt to p/u jug today.

## 2013-04-10 ENCOUNTER — Telehealth: Payer: Self-pay | Admitting: Hematology and Oncology

## 2013-04-10 ENCOUNTER — Ambulatory Visit (HOSPITAL_BASED_OUTPATIENT_CLINIC_OR_DEPARTMENT_OTHER): Payer: Medicare Other

## 2013-04-10 ENCOUNTER — Ambulatory Visit (HOSPITAL_BASED_OUTPATIENT_CLINIC_OR_DEPARTMENT_OTHER): Payer: Medicare Other | Admitting: Hematology and Oncology

## 2013-04-10 ENCOUNTER — Other Ambulatory Visit (HOSPITAL_BASED_OUTPATIENT_CLINIC_OR_DEPARTMENT_OTHER): Payer: Medicare Other | Admitting: Lab

## 2013-04-10 ENCOUNTER — Other Ambulatory Visit: Payer: Self-pay | Admitting: Hematology and Oncology

## 2013-04-10 ENCOUNTER — Encounter: Payer: Self-pay | Admitting: Hematology and Oncology

## 2013-04-10 VITALS — BP 106/66 | HR 82 | Temp 97.5°F | Resp 20 | Ht 63.0 in | Wt 102.9 lb

## 2013-04-10 DIAGNOSIS — R197 Diarrhea, unspecified: Secondary | ICD-10-CM

## 2013-04-10 DIAGNOSIS — Z23 Encounter for immunization: Secondary | ICD-10-CM

## 2013-04-10 DIAGNOSIS — G8929 Other chronic pain: Secondary | ICD-10-CM

## 2013-04-10 DIAGNOSIS — R55 Syncope and collapse: Secondary | ICD-10-CM

## 2013-04-10 DIAGNOSIS — K529 Noninfective gastroenteritis and colitis, unspecified: Secondary | ICD-10-CM

## 2013-04-10 DIAGNOSIS — E859 Amyloidosis, unspecified: Secondary | ICD-10-CM

## 2013-04-10 DIAGNOSIS — M549 Dorsalgia, unspecified: Secondary | ICD-10-CM

## 2013-04-10 DIAGNOSIS — E639 Nutritional deficiency, unspecified: Secondary | ICD-10-CM

## 2013-04-10 HISTORY — DX: Other chronic pain: G89.29

## 2013-04-10 LAB — CBC WITH DIFFERENTIAL/PLATELET
EOS%: 2.1 % (ref 0.0–7.0)
Eosinophils Absolute: 0.1 10*3/uL (ref 0.0–0.5)
MCH: 29.8 pg (ref 25.1–34.0)
MCHC: 33.1 g/dL (ref 31.5–36.0)
MCV: 90.2 fL (ref 79.5–101.0)
MONO%: 9.2 % (ref 0.0–14.0)
NEUT%: 70.3 % (ref 38.4–76.8)
Platelets: 144 10*3/uL — ABNORMAL LOW (ref 145–400)
RBC: 3.3 10*6/uL — ABNORMAL LOW (ref 3.70–5.45)
RDW: 13.6 % (ref 11.2–14.5)

## 2013-04-10 LAB — COMPREHENSIVE METABOLIC PANEL (CC13)
AST: 28 U/L (ref 5–34)
Albumin: 4 g/dL (ref 3.5–5.0)
Alkaline Phosphatase: 85 U/L (ref 40–150)
BUN: 15.3 mg/dL (ref 7.0–26.0)
Creatinine: 1.3 mg/dL — ABNORMAL HIGH (ref 0.6–1.1)
Glucose: 103 mg/dl (ref 70–140)
Potassium: 4.9 mEq/L (ref 3.5–5.1)
Sodium: 138 mEq/L (ref 136–145)
Total Bilirubin: 0.93 mg/dL (ref 0.20–1.20)
Total Protein: 7.2 g/dL (ref 6.4–8.3)

## 2013-04-10 MED ORDER — HYDROCODONE-ACETAMINOPHEN 5-325 MG PO TABS: 1.0000 | ORAL_TABLET | Freq: Four times a day (QID) | ORAL | Status: DC | PRN

## 2013-04-10 MED ORDER — INFLUENZA VAC SPLIT QUAD 0.5 ML IM SUSP
0.5000 mL | Freq: Once | INTRAMUSCULAR | Status: AC
  Administered 2013-04-10: 0.5 mL via INTRAMUSCULAR
  Filled 2013-04-10: qty 0.5

## 2013-04-10 MED ORDER — OCTREOTIDE ACETATE 30 MG IM KIT
30.0000 mg | PACK | Freq: Once | INTRAMUSCULAR | Status: AC
  Administered 2013-04-10: 30 mg via INTRAMUSCULAR
  Filled 2013-04-10: qty 1

## 2013-04-10 MED ORDER — INFLUENZA VAC SPLIT QUAD 0.5 ML IM SUSP
0.5000 mL | Freq: Once | INTRAMUSCULAR | Status: DC
  Filled 2013-04-10: qty 0.5

## 2013-04-10 NOTE — Progress Notes (Signed)
[facility] OFFICE PROGRESS NOTE  Patient Care Team: Ernestina Penna, MD as PCP - General Lynett Fish, MD (Hematology and Oncology) Laurey Morale, MD (Cardiology) Malissa Hippo, MD (Gastroenterology)  DIAGNOSIS: Systemic amyloidosis  SUMMARY OF ONCOLOGIC HISTORY: The patient herself is a poor historian. I reviewed her records and collaborated the history with her. According to the patient, show chronic anemia for a long time and developed syncopal episode. 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 She had evaluation and second opinion at [facility] and was deemed not a candidate for bone marrow transplant. She had been treated with Velcade and dexamethasone in the past with some partial response. The patient developed intractable diarrhea with bowel perforation in 2011 and since then she has been placed on observation.  INTERVAL HISTORY: [name] 72 y.o. female returns for further followup. Denies any recent recurrent syncopal episode. Denies any recent infection. She denies any recent fever, chills, night sweats or abnormal weight loss The patient denies any recent signs or symptoms of bleeding such as spontaneous epistaxis, hematuria or hematochezia. The patient has chronic back pain and takes narcotic pain medications. She also have very mild peripheral neuropathy effecting the tips of her fingers.  I have reviewed the past medical history, past surgical history, social history and family history with the patient and they are unchanged from previous note.  ALLERGIES:  has No Known Allergies.  MEDICATIONS:  Current Outpatient Prescriptions  Medication Sig Dispense Refill  . amitriptyline (ELAVIL) 25 MG tablet Take 25 mg by mouth at bedtime.       Marland Kitchen aspirin EC 81 MG tablet Take 1 tablet (81 mg total) by mouth daily.      Marland Kitchen atorvastatin (LIPITOR) 40 MG tablet Take 40 mg by mouth daily.      . Calcium Carbonate-Vit  D-Min 1200-1000 MG-UNIT CHEW Chew 1 tablet by mouth 2 (two) times daily.      . diphenoxylate-atropine (LOMOTIL) 2.5-0.025 MG per tablet TAKE ONE TABLET BY MOUTH EVERY 6 HOURS AS NEEDED FOR DIARRHEA  120 tablet  2  . folic acid (FOLVITE) 1 MG tablet Take 1 mg by mouth daily.        Marland Kitchen gabapentin (NEURONTIN) 300 MG capsule Take 1 capsule (300 mg total) by mouth 3 (three) times daily.  90 capsule  6  . HYDROcodone-acetaminophen (NORCO/VICODIN) 5-325 MG per tablet Take 1 tablet by mouth every 6 (six) hours as needed. For pain  90 tablet  0  . lipase/protease/amylase (CREON-10/PANCREASE) 12000 UNITS CPEP Take 1 capsule by mouth 3 (three) times daily.       Marland Kitchen loperamide (IMODIUM) 2 MG capsule Take 2 mg by mouth 4 (four) times daily as needed. For diarrhea       . megestrol (MEGACE) 40 MG/ML suspension Take 200 mg by mouth 2 (two) times daily as needed. For appetite control      . metoCLOPramide (REGLAN) 10 MG tablet Take 10 mg by mouth 2 (two) times daily.        . metoprolol succinate (TOPROL-XL) 100 MG 24 hr tablet Take 1 tablet (100 mg total) by mouth daily. Take with or immediately following a meal.  30 tablet  6  . Multiple Vitamin (MULTIVITAMIN) tablet Take 1 tablet by mouth daily.        Marland Kitchen nystatin-triamcinolone ointment (MYCOLOG) Apply topically 2 (two) times daily.  30 g  0  . ondansetron (ZOFRAN) 8 MG tablet Take  8 mg by mouth every 8 (eight) hours as needed. For nausea      . oxyCODONE-acetaminophen (PERCOCET/ROXICET) 5-325 MG per tablet Take as directed      . pantoprazole (PROTONIX) 40 MG tablet Take 1 tablet (40 mg total) by mouth 2 (two) times daily before a meal.  60 tablet  2  . zolpidem (AMBIEN) 10 MG tablet Take 5 mg by mouth at bedtime as needed. For sleep       No current facility-administered medications for this visit.    REVIEW OF SYSTEMS:   Constitutional: Denies fevers, chills or abnormal weight loss Eyes: Denies blurriness of vision Ears, nose, mouth, throat, and face:  Denies mucositis or sore throat Respiratory: Denies cough, dyspnea or wheezes Cardiovascular: Denies palpitation, chest discomfort or lower extremity swelling Gastrointestinal:  Denies nausea, heartburn or change in bowel habits Skin: Denies abnormal skin rashes Lymphatics: Denies new lymphadenopathy or easy bruising Behavioral/Psych: Mood is stable, no new changes  All other systems were reviewed with the patient and are negative.  PHYSICAL EXAMINATION: ECOG PERFORMANCE STATUS: 2 - Symptomatic, <50% confined to bed  Filed Vitals:   04/10/13 1136  BP: 106/66  Pulse: 82  Temp: 97.5 F (36.4 C)  Resp: 20   Filed Weights   04/10/13 1136  Weight: 102 lb 14.4 oz (46.675 kg)    GENERAL:alert, no distress and comfortable. The patient appears thin and mildly cachectic  SKIN: skin color, texture, turgor are normal, no rashes or significant lesions EYES: normal, Conjunctiva are pink and non-injected, sclera clear OROPHARYNX:no exudate, no erythema and lips, buccal mucosa, and tongue normal  NECK: supple, thyroid normal size, non-tender, without nodularity LYMPH:  no palpable lymphadenopathy in the cervical, axillary or inguinal LUNGS: clear to auscultation and percussion with normal breathing effort HEART: regular rate & rhythm and no murmurs and no lower extremity edema ABDOMEN:abdomen soft, non-tender and normal bowel sounds. The stoma looks okay  Musculoskeletal:no cyanosis of digits and no clubbing  NEURO: alert & oriented x 3 with fluent speech, no focal motor/sensory deficits  LABORATORY DATA:  I have reviewed the data as listed    Component Value Date/Time   NA 138 04/10/2013 1108   NA 140 08/16/2012 0355   NA 138 09/01/2010   K 4.9 04/10/2013 1108   K 3.2* 08/16/2012 0355   CL 106 09/18/2012 1041   CL 104 08/16/2012 0355   CO2 23 04/10/2013 1108   CO2 27 08/16/2012 0355   GLUCOSE 103 04/10/2013 1108   GLUCOSE 107* 09/18/2012 1041   GLUCOSE 108* 08/16/2012 0355   BUN 15.3  04/10/2013 1108   BUN 21 08/16/2012 0355   CREATININE 1.3* 04/10/2013 1108   CREATININE 1.30* 08/16/2012 0355   CALCIUM 10.1 04/10/2013 1108   CALCIUM 8.4 08/16/2012 0355   PROT 7.2 04/10/2013 1108   PROT 7.4 08/15/2012 0016   ALBUMIN 4.0 04/10/2013 1108   ALBUMIN 4.2 08/15/2012 0016   AST 28 04/10/2013 1108   AST 22 08/15/2012 0016   ALT 19 04/10/2013 1108   ALT 16 08/15/2012 0016   ALKPHOS 85 04/10/2013 1108   ALKPHOS 104 08/15/2012 0016   BILITOT 0.93 04/10/2013 1108   BILITOT 0.4 08/15/2012 0016   GFRNONAA 40* 08/16/2012 0355   GFRAA 47* 08/16/2012 0355    No results found for this basename: SPEP, UPEP,  kappa and lambda light chains    Lab Results  Component Value Date   WBC 5.0 04/10/2013   NEUTROABS 3.5 04/10/2013  HGB 9.8* 04/10/2013   HCT 29.7* 04/10/2013   MCV 90.2 04/10/2013   PLT 144* 04/10/2013      Chemistry      Component Value Date/Time   NA 138 04/10/2013 1108   NA 140 08/16/2012 0355   NA 138 09/01/2010   K 4.9 04/10/2013 1108   K 3.2* 08/16/2012 0355   CL 106 09/18/2012 1041   CL 104 08/16/2012 0355   CO2 23 04/10/2013 1108   CO2 27 08/16/2012 0355   BUN 15.3 04/10/2013 1108   BUN 21 08/16/2012 0355   CREATININE 1.3* 04/10/2013 1108   CREATININE 1.30* 08/16/2012 0355   GLU 97 09/01/2010      Component Value Date/Time   CALCIUM 10.1 04/10/2013 1108   CALCIUM 8.4 08/16/2012 0355   ALKPHOS 85 04/10/2013 1108   ALKPHOS 104 08/15/2012 0016   AST 28 04/10/2013 1108   AST 22 08/15/2012 0016   ALT 19 04/10/2013 1108   ALT 16 08/15/2012 0016   BILITOT 0.93 04/10/2013 1108   BILITOT 0.4 08/15/2012 0016     ASSESSMENT:  #1 amyloidosis #2 chronic diarrhea #3 chronic back pain  PLAN:  #1 amyloidosis I'm not able to get much history. Her blood work and 24 hour urine collection did show elevated lambda light chains. Overall she is doing well. I recommend continue observation. 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 Amyloidosis sometimes can cause this although she  had multiple GI biopsy which ruled out amyloidosis involvement. We'll continue octreotide injection once a month. She'll continue her Imodium and Lomotil #3 chronic back pain She denies any reason for your recent diagnosis of compression fractures. She is taking some calcium supplement. I refilled her prescription pain medications in my clinic today. #4 preventive care We discussed the importance of preventive care and reviewed the vaccination programs. She does not have any prior allergic reactions to influenza vaccination. She agrees to proceed with influenza vaccination today and we will administer it today at the clinic.  Orders Placed This Encounter  Procedures  . SPEP & IFE with QIG    Standing Status: Future     Number of Occurrences:      Standing Expiration Date: 04/10/2014  . Kappa/lambda light chains    Standing Status: Future     Number of Occurrences:      Standing Expiration Date: 04/10/2014  . CBC with Differential    Standing Status: Future     Number of Occurrences:      Standing Expiration Date: 12/31/2013  . Comprehensive metabolic panel    Standing Status: Future     Number of Occurrences:      Standing Expiration Date: 04/10/2014  . Lactate dehydrogenase    Standing Status: Future     Number of Occurrences:      Standing Expiration Date: 04/10/2014  . Beta 2 microglobuline, serum    Standing Status: Future     Number of Occurrences:      Standing Expiration Date: 04/10/2014   All questions were answered. The patient knows to call the clinic with any problems, questions or concerns. No barriers to learning was detected.    [facility], Marnette Perkins, MD 04/10/2013 12:22 PM

## 2013-04-10 NOTE — Telephone Encounter (Signed)
Gave pt appt for lab,MD and injections on November 2014

## 2013-04-12 LAB — UPEP/TP, 24-HR URINE
Albumin: 32.7 %
Alpha-1-Globulin, U: 16 %
Collection Interval: 24 hours
Gamma Globulin, U: 13.8 %
Monoclonal Band 1: 17.4 %
Total Protein, Urine: 44 mg/dL
Total Volume, Urine: 1000 mL

## 2013-04-12 LAB — UIFE/LIGHT CHAINS/TP QN, 24-HR UR
Albumin, U: DETECTED
Alpha 1, Urine: DETECTED — AB
Alpha 2, Urine: DETECTED — AB
Beta, Urine: DETECTED — AB
Free Kappa Lt Chains,Ur: 7.4 mg/dL — ABNORMAL HIGH (ref 0.14–2.42)
Free Lambda Lt Chains,Ur: 43.1 mg/dL — ABNORMAL HIGH (ref 0.02–0.67)
Gamma Globulin, Urine: DETECTED — AB
Time: 24 hours

## 2013-04-13 LAB — SPEP & IFE WITH QIG
Alpha-2-Globulin: 11.2 % (ref 7.1–11.8)
IgG (Immunoglobin G), Serum: 774 mg/dL (ref 690–1700)
Total Protein, Serum Electrophoresis: 6.5 g/dL (ref 6.0–8.3)

## 2013-04-13 LAB — BETA 2 MICROGLOBULIN, SERUM: Beta-2 Microglobulin: 2.74 mg/L — ABNORMAL HIGH (ref 1.01–1.73)

## 2013-04-13 LAB — KAPPA/LAMBDA LIGHT CHAINS
Kappa free light chain: 1.09 mg/dL (ref 0.33–1.94)
Kappa:Lambda Ratio: 0.08 — ABNORMAL LOW (ref 0.26–1.65)

## 2013-04-13 LAB — TROPONIN I: Troponin I: <0.01 ng/mL (ref <0.06)

## 2013-04-16 ENCOUNTER — Telehealth: Payer: Self-pay | Admitting: *Deleted

## 2013-04-16 NOTE — Telephone Encounter (Signed)
Belenda Cruise, pharmacist, from [facility] in [place] states pt brought in 2 rxs today.   One from Dr. Bertis Ruddy for hydrocodone and one from another provider for percocet.  She asks if Dr. Bertis Ruddy wants her to have both?  Per Dr. Bertis Ruddy, cancel the rx for hydrocodone,  Pt should not take both pain meds.  Instructed pharmacist to cancel rx for hydrocodone from Dr. Bertis Ruddy.

## 2013-04-17 ENCOUNTER — Encounter: Payer: Self-pay | Admitting: Nurse Practitioner

## 2013-04-17 ENCOUNTER — Ambulatory Visit (INDEPENDENT_AMBULATORY_CARE_PROVIDER_SITE_OTHER): Payer: Medicare Other | Admitting: Nurse Practitioner

## 2013-04-17 VITALS — BP 100/63 | HR 88 | Temp 98.1°F | Ht 63.0 in | Wt 100.0 lb

## 2013-04-17 DIAGNOSIS — Z01419 Encounter for gynecological examination (general) (routine) without abnormal findings: Secondary | ICD-10-CM

## 2013-04-17 DIAGNOSIS — Z0000 Encounter for general adult medical examination without abnormal findings: Secondary | ICD-10-CM

## 2013-04-17 DIAGNOSIS — I10 Essential (primary) hypertension: Secondary | ICD-10-CM

## 2013-04-17 DIAGNOSIS — Z23 Encounter for immunization: Secondary | ICD-10-CM

## 2013-04-17 NOTE — Patient Instructions (Signed)

## 2013-04-17 NOTE — Progress Notes (Signed)
Subjective:    Patient ID: [name], female    DOB: 11/21/1940, 72 y.o.   MRN: [id number]  HPI PAtient in today for CPE no pap- SHe is Hanging in there as she says- no better no worse. Patient Active Problem List   Diagnosis Date Noted  . Chronic back pain 04/10/2013  . Need for prophylactic vaccination and inoculation against influenza 04/10/2013  . Dysphagia, unspecified(787.20) 12/27/2012  . Nausea vomiting and diarrhea 08/15/2012  . Acute kidney injury secondary to dehydration 08/15/2012  . Hyperkalemia 08/15/2012  . Hypotension, unspecified 08/15/2012  . Metabolic acidosis 08/15/2012  . Other pancytopenia 08/15/2012  . Amyloidosis 05/17/2012  . 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 Intestinal bacterial overgrowth 09/06/2011  . Peristomal skin irritation and breakdown 09/06/2011  . Occlusion and stenosis of carotid artery without mention of cerebral infarction 07/21/2011  . Carotid stenosis 10/25/2010  . EDEMA 09/08/2009  . CARDIOMYOPATHY 02/05/2009  . HOCM 01/28/2009  . CARDIOMYOPATHY, AMYLOID 01/28/2009  . ORTHOSTATIC DIZZINESS 10/28/2008  . HYPERLIPIDEMIA-MIXED 10/08/2008  . HYPERTENSION, UNSPECIFIED 10/08/2008  . Syncope and collapse 10/08/2008  . CARDIAC MURMUR 10/08/2008  . DYSPNEA 10/08/2008  . CHEST PAIN-PRECORDIAL 10/08/2008   Outpatient Encounter Prescriptions as of 04/17/2013  Medication Sig  . amitriptyline (ELAVIL) 25 MG tablet Take 25 mg by mouth at bedtime.   Marland Kitchen aspirin EC 81 MG tablet Take 1 tablet (81 mg total) by mouth daily.  Marland Kitchen atorvastatin (LIPITOR) 40 MG tablet Take 40 mg by mouth daily.  . Calcium Carbonate-Vit D-Min 1200-1000 MG-UNIT CHEW Chew 1 tablet by mouth 2 (two) times daily.  . diphenoxylate-atropine (LOMOTIL) 2.5-0.025 MG per tablet TAKE ONE TABLET BY MOUTH EVERY 6 HOURS AS NEEDED FOR DIARRHEA  . folic acid (FOLVITE) 1 MG tablet Take 1 mg by mouth daily.    Marland Kitchen gabapentin (NEURONTIN) 300 MG capsule Take 1 capsule (300 mg total) by mouth 3  (three) times daily.  . lipase/protease/amylase (CREON-10/PANCREASE) 12000 UNITS CPEP Take 1 capsule by mouth 3 (three) times daily.   Marland Kitchen loperamide (IMODIUM) 2 MG capsule Take 2 mg by mouth 4 (four) times daily as needed. For diarrhea   . megestrol (MEGACE) 40 MG/ML suspension Take 200 mg by mouth 2 (two) times daily as needed. For appetite control  . metoCLOPramide (REGLAN) 10 MG tablet Take 10 mg by mouth 2 (two) times daily.    . metoprolol succinate (TOPROL-XL) 100 MG 24 hr tablet Take 1 tablet (100 mg total) by mouth daily. Take with or immediately following a meal.  . Multiple Vitamin (MULTIVITAMIN) tablet Take 1 tablet by mouth daily.    Marland Kitchen nystatin-triamcinolone ointment (MYCOLOG) Apply topically 2 (two) times daily.  . ondansetron (ZOFRAN) 8 MG tablet Take 8 mg by mouth every 8 (eight) hours as needed. For nausea  . oxyCODONE-acetaminophen (PERCOCET/ROXICET) 5-325 MG per tablet Take as directed  . pantoprazole (PROTONIX) 40 MG tablet Take 1 tablet (40 mg total) by mouth 2 (two) times daily before a meal.  . zolpidem (AMBIEN) 10 MG tablet Take 5 mg by mouth at bedtime as needed. For sleep  . [DISCONTINUED] HYDROcodone-acetaminophen (NORCO/VICODIN) 5-325 MG per tablet        Review of Systems  Constitutional: Positive for fatigue.  HENT: Negative.   Eyes: Negative.   Respiratory: Negative.   Cardiovascular: Negative.   Gastrointestinal: Negative.   Neurological: Positive for weakness.       Objective:   Physical Exam  Constitutional: She is oriented to person, place, and time. She appears  well-developed and well-nourished.  HENT:  Nose: Nose normal.  Mouth/Throat: Oropharynx is clear and moist.  Eyes: EOM are normal.  Neck: Trachea normal, normal range of motion and full passive range of motion without pain. Neck supple. No JVD present. Carotid bruit is not present. No thyromegaly present.  Cardiovascular: Normal rate, regular rhythm, normal heart sounds and intact distal  pulses.  Exam reveals no gallop and no friction rub.   No murmur heard. Pulmonary/Chest: Effort normal and breath sounds normal.  Abdominal: Soft. Bowel sounds are normal. She exhibits no distension and no mass. There is no tenderness.  Musculoskeletal: Normal range of motion.  Lymphadenopathy:    She has no cervical adenopathy.  Neurological: She is alert and oriented to person, place, and time. She has normal reflexes.  Skin: Skin is warm and dry.  Psychiatric: She has a normal mood and affect. Her behavior is normal. Judgment and thought content normal.    BP 100/63  Pulse 88  Temp(Src) 98.1 F (36.7 C) (Oral)  Ht 5\' 3"  (1.6 m)  Wt 100 lb (45.36 kg)  BMI 17.72 kg/m2       Assessment & Plan:   1. Encounter for routine gynecological examination   2. Annual physical exam   3. Hypertension    Orders Placed This Encounter  Procedures  . Anemia Profile B  . CMP14+EGFR  . NMR, lipoprofile   Meds ordered this encounter  Medications  . DISCONTD: HYDROcodone-acetaminophen (NORCO/VICODIN) 5-325 MG per tablet    Sig:    prevnar vaccine today Continue all meds Labs pending Diet and exercise encouraged Health maintenance reviewed Follow up in 3 months  Mary-Margaret Daphine Deutscher, FNP

## 2013-04-19 LAB — ANEMIA PROFILE B
Eosinophils Absolute: 0.1 10*3/uL (ref 0.0–0.4)
Ferritin: 615 ng/mL — ABNORMAL HIGH (ref 15–150)
Hemoglobin: 10.2 g/dL — ABNORMAL LOW (ref 11.1–15.9)
Immature Grans (Abs): 0 10*3/uL (ref 0.0–0.1)
Immature Granulocytes: 0 %
Iron: 51 ug/dL (ref 35–155)
MCH: 29.6 pg (ref 26.6–33.0)
MCHC: 32.3 g/dL (ref 31.5–35.7)
Monocytes Absolute: 0.3 10*3/uL (ref 0.1–0.9)
Neutrophils Absolute: 2.1 10*3/uL (ref 1.4–7.0)
Neutrophils Relative %: 60 %
Platelets: 174 10*3/uL (ref 150–379)
RDW: 14.2 % (ref 12.3–15.4)
UIBC: 216 ug/dL (ref 150–375)
Vitamin B-12: 969 pg/mL — ABNORMAL HIGH (ref 211–946)
WBC: 3.5 10*3/uL (ref 3.4–10.8)

## 2013-04-19 LAB — NMR, LIPOPROFILE
HDL Particle Number: 36.1 umol/L (ref >=30.5)
LDL Size: 21.2 nm (ref >20.5)
LDLC SERPL CALC-MCNC: 62 mg/dL (ref <100)
LP-IR Score: <25 (ref <=45)
Small LDL Particle Number: 439 nmol/L (ref <=527)
Triglycerides by NMR: 188 mg/dL — ABNORMAL HIGH (ref <150)

## 2013-04-19 LAB — CMP14+EGFR
AST: 51 IU/L — ABNORMAL HIGH (ref 0–40)
Albumin/Globulin Ratio: 2.4 (ref 1.1–2.5)
Alkaline Phosphatase: 90 IU/L (ref 39–117)
BUN/Creatinine Ratio: 12 (ref 11–26)
BUN: 15 mg/dL (ref 8–27)
CO2: 20 mmol/L (ref 18–29)
Chloride: 103 mmol/L (ref 97–108)
GFR calc Af Amer: 52 mL/min/{1.73_m2} — ABNORMAL LOW (ref >59)
GFR calc non Af Amer: 45 mL/min/{1.73_m2} — ABNORMAL LOW (ref >59)
Globulin, Total: 2 g/dL (ref 1.5–4.5)
Sodium: 141 mmol/L (ref 134–144)
Total Bilirubin: 0.4 mg/dL (ref 0.0–1.2)
Total Protein: 6.7 g/dL (ref 6.0–8.5)

## 2013-04-30 ENCOUNTER — Encounter (HOSPITAL_COMMUNITY): Payer: Self-pay | Admitting: Emergency Medicine

## 2013-04-30 ENCOUNTER — Inpatient Hospital Stay (HOSPITAL_COMMUNITY)
Admission: EM | Admit: 2013-04-30 | Discharge: 2013-05-03 | DRG: 689 | Disposition: A | Discharge status: Home / Self Care | Payer: Medicare Other | Attending: Internal Medicine | Admitting: Internal Medicine

## 2013-04-30 DIAGNOSIS — E43 Unspecified severe protein-calorie malnutrition: Secondary | ICD-10-CM | POA: Diagnosis present

## 2013-04-30 DIAGNOSIS — A498 Other bacterial infections of unspecified site: Secondary | ICD-10-CM | POA: Diagnosis present

## 2013-04-30 DIAGNOSIS — D696 Thrombocytopenia, unspecified: Secondary | ICD-10-CM | POA: Diagnosis present

## 2013-04-30 DIAGNOSIS — R197 Diarrhea, unspecified: Secondary | ICD-10-CM | POA: Diagnosis present

## 2013-04-30 DIAGNOSIS — N179 Acute kidney failure, unspecified: Secondary | ICD-10-CM | POA: Diagnosis present

## 2013-04-30 DIAGNOSIS — R5381 Other malaise: Secondary | ICD-10-CM | POA: Diagnosis present

## 2013-04-30 DIAGNOSIS — N390 Urinary tract infection, site not specified: Principal | ICD-10-CM | POA: Diagnosis present

## 2013-04-30 DIAGNOSIS — Z8249 Family history of ischemic heart disease and other diseases of the circulatory system: Secondary | ICD-10-CM

## 2013-04-30 DIAGNOSIS — E860 Dehydration: Secondary | ICD-10-CM | POA: Diagnosis present

## 2013-04-30 DIAGNOSIS — R4182 Altered mental status, unspecified: Secondary | ICD-10-CM | POA: Diagnosis present

## 2013-04-30 DIAGNOSIS — K219 Gastro-esophageal reflux disease without esophagitis: Secondary | ICD-10-CM | POA: Diagnosis present

## 2013-04-30 DIAGNOSIS — R42 Dizziness and giddiness: Secondary | ICD-10-CM | POA: Diagnosis present

## 2013-04-30 DIAGNOSIS — Z7982 Long term (current) use of aspirin: Secondary | ICD-10-CM

## 2013-04-30 DIAGNOSIS — D638 Anemia in other chronic diseases classified elsewhere: Secondary | ICD-10-CM | POA: Diagnosis present

## 2013-04-30 DIAGNOSIS — E859 Amyloidosis, unspecified: Secondary | ICD-10-CM | POA: Diagnosis present

## 2013-04-30 DIAGNOSIS — Z681 Body mass index (BMI) 19 or less, adult: Secondary | ICD-10-CM

## 2013-04-30 DIAGNOSIS — D72819 Decreased white blood cell count, unspecified: Secondary | ICD-10-CM | POA: Diagnosis present

## 2013-04-30 DIAGNOSIS — D709 Neutropenia, unspecified: Secondary | ICD-10-CM

## 2013-04-30 DIAGNOSIS — E162 Hypoglycemia, unspecified: Secondary | ICD-10-CM | POA: Diagnosis present

## 2013-04-30 DIAGNOSIS — Z9049 Acquired absence of other specified parts of digestive tract: Secondary | ICD-10-CM

## 2013-04-30 DIAGNOSIS — E785 Hyperlipidemia, unspecified: Secondary | ICD-10-CM | POA: Diagnosis present

## 2013-04-30 DIAGNOSIS — I10 Essential (primary) hypertension: Secondary | ICD-10-CM | POA: Diagnosis present

## 2013-04-30 DIAGNOSIS — Z933 Colostomy status: Secondary | ICD-10-CM

## 2013-04-30 DIAGNOSIS — G589 Mononeuropathy, unspecified: Secondary | ICD-10-CM | POA: Diagnosis present

## 2013-04-30 DIAGNOSIS — M549 Dorsalgia, unspecified: Secondary | ICD-10-CM | POA: Diagnosis present

## 2013-04-30 DIAGNOSIS — G629 Polyneuropathy, unspecified: Secondary | ICD-10-CM | POA: Diagnosis present

## 2013-04-30 DIAGNOSIS — Z87891 Personal history of nicotine dependence: Secondary | ICD-10-CM

## 2013-04-30 DIAGNOSIS — G8929 Other chronic pain: Secondary | ICD-10-CM | POA: Diagnosis present

## 2013-04-30 DIAGNOSIS — R55 Syncope and collapse: Secondary | ICD-10-CM | POA: Diagnosis present

## 2013-04-30 LAB — CBC WITH DIFFERENTIAL/PLATELET
Basophils Absolute: 0 10*3/uL (ref 0.0–0.1)
HCT: 28.8 % — ABNORMAL LOW (ref 36.0–46.0)
Hemoglobin: 9.5 g/dL — ABNORMAL LOW (ref 12.0–15.0)
Lymphocytes Relative: 19 % (ref 12–46)
Monocytes Absolute: 0.5 10*3/uL (ref 0.1–1.0)
Neutro Abs: 2.7 10*3/uL (ref 1.7–7.7)
Platelets: 142 10*3/uL — ABNORMAL LOW (ref 150–400)
RDW: 13 % (ref 11.5–15.5)
WBC: 4 10*3/uL (ref 4.0–10.5)

## 2013-04-30 LAB — COMPREHENSIVE METABOLIC PANEL
ALT: 23 U/L (ref 0–35)
AST: 32 U/L (ref 0–37)
Albumin: 4.2 g/dL (ref 3.5–5.2)
Alkaline Phosphatase: 82 U/L (ref 39–117)
CO2: 22 mEq/L (ref 19–32)
Chloride: 109 mEq/L (ref 96–112)
Creatinine, Ser: 1.53 mg/dL — ABNORMAL HIGH (ref 0.50–1.10)
GFR calc non Af Amer: 33 mL/min — ABNORMAL LOW (ref >90)
Glucose, Bld: 46 mg/dL — ABNORMAL LOW (ref 70–99)
Potassium: 4.1 mEq/L (ref 3.5–5.1)
Sodium: 141 mEq/L (ref 135–145)
Total Bilirubin: 0.4 mg/dL (ref 0.3–1.2)

## 2013-04-30 LAB — POCT I-STAT TROPONIN I: Troponin i, poc: 0 ng/mL (ref 0.00–0.08)

## 2013-04-30 LAB — TYPE AND SCREEN: Antibody Screen: NEGATIVE

## 2013-04-30 LAB — URINALYSIS, ROUTINE W REFLEX MICROSCOPIC
Bilirubin Urine: NEGATIVE
Nitrite: POSITIVE — AB
Protein, ur: NEGATIVE mg/dL
Specific Gravity, Urine: 1.022 (ref 1.005–1.030)
Urobilinogen, UA: 0.2 mg/dL (ref 0.0–1.0)
pH: 5 (ref 5.0–8.0)

## 2013-04-30 LAB — URINE MICROSCOPIC-ADD ON

## 2013-04-30 LAB — PROTIME-INR
INR: 1.16 (ref 0.00–1.49)
Prothrombin Time: 14.6 seconds (ref 11.6–15.2)

## 2013-04-30 LAB — GLUCOSE, CAPILLARY: Glucose-Capillary: 45 mg/dL — ABNORMAL LOW (ref 70–99)

## 2013-04-30 LAB — APTT: aPTT: 29 seconds (ref 24–37)

## 2013-04-30 MED ORDER — DEXTROSE 50 % IV SOLN
1.0000 | Freq: Once | INTRAVENOUS | Status: AC
  Administered 2013-04-30: 50 mL via INTRAVENOUS
  Filled 2013-04-30: qty 50

## 2013-04-30 MED ORDER — SODIUM CHLORIDE 0.9 % IV SOLN
INTRAVENOUS | Status: DC
  Administered 2013-04-30 – 2013-05-03 (×2): via INTRAVENOUS

## 2013-04-30 MED ORDER — SODIUM CHLORIDE 0.9 % IV BOLUS (SEPSIS)
1000.0000 mL | Freq: Once | INTRAVENOUS | Status: AC
  Administered 2013-04-30: 1000 mL via INTRAVENOUS

## 2013-04-30 MED ORDER — DEXTROSE 5 % IV SOLN
1.0000 g | Freq: Once | INTRAVENOUS | Status: AC
  Administered 2013-04-30: 1 g via INTRAVENOUS
  Filled 2013-04-30: qty 10

## 2013-04-30 NOTE — ED Notes (Signed)
Pt ambulated to bathroom with walker without difficulty.

## 2013-04-30 NOTE — ED Notes (Signed)
Per EMS, pt was reccommended to visit emergency department by her doctor's office due to her hgb being low (labs were drawn last week). Pt complains of weakness.

## 2013-04-30 NOTE — H&P (Signed)
[facility] History and Physical  [name] ZOX:[id number] DOB: 11/12/1940 DOA: 04/30/2013  Referring physician: er PCP: Rudi Heap, MD  Specialists:   Chief Complaint: "syncope"  HPI: [name] is a 72 y.o. female  With multiple medical issues- Was out earlier today- ate a large (for her lunch) of pinto beans and milkshakes.  She then came home and was taking care of her colostomy bag when she felt weak and heard voices but could not make sense of them.  Her BS was found to be < 40.  She currently has no complaints except that her appetite has been poor.  Does not endorse urgency or burning with urination SOB is at baseline No chest pain No fever, no chills  In the ER, her initial BS was low and was corrected and symptoms have resolved,  Her Cr was elevated from her baseline and she was thought to have a UTI   Review of Systems: all systems reviewed, negative unless stated above   Past Medical History  Diagnosis Date  . History of CEA (carotid endarterectomy) 03/2007    Left CEA by Dr. Cari Caraway  . Hypertension   . Hyperlipidemia   . GERD (gastroesophageal reflux disease)   . Anemia in chronic kidney disease(285.21) 02/2009    She has been on Aranesp for ? anemia of chronic kidney disease though renal function does not seem severely impaired.  She had repeat bone marrow biopsy by Dr. Cleone Slim 9/10.  Marland Kitchen Episode of syncope     dysauthonomia/ orthostatic syncope - Patient has Medtronic loop recorder implanted 8/10.  No events so far - May be due only to orthostasis  . 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    . History of MRI 01/2009    CardiacMRI/EF 66%, mild to moderate concentric LVH, RV size & systolic function normAL w/o evidence for ARVC, trivial pericardial effusion/On delayed enhancement, it was difficult to obtain the TI/There was patchy mid-wall basal delayed enhancement/There was partially circumferential  subendocardialdelayed enhancement in the mid to apical LV/This was suggestive of infiltrative cardiomyopathy versus  . History of CT scan of chest 01/2009    to assess for sarcoidosis: no evidence by CT for pulmonary sarcoid  . 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   . Secretory diarrhea      treated with octreotide.   . Esophageal stricture 05/2010    with dilation  . Amyloidosis   . Shortness of breath     when tired  . Cancer 2010    Cancer of the Blood stream  . Chronic back pain 04/10/2013   Past Surgical History  Procedure Laterality Date  . Implantable loop recorder  2010    MDT for unexplained syncope  . Partial colectomy  1/11    for diverticular perforation  . Carotid endarterectomy  10/08    left  . Abdominal hysterectomy    . Upper gastrointestinal endoscopy  05/20/2010    EGD ED  . Colonoscopy  03/14/2009  . Ercp  04/09/2011    Procedure: ENDOSCOPIC RETROGRADE CHOLANGIOPANCREATOGRAPHY (ERCP);  Surgeon: Malissa Hippo, MD;  Location: [facility] ORS;  Service: Endoscopy;  Laterality: N/A;  10:25  . Sphincterotomy  04/09/2011    Procedure: SPHINCTEROTOMY;  Surgeon: Malissa Hippo, MD;  Location: [facility] ORS;  Service: Endoscopy;  Laterality: N/A;  with removal of multiple  pigment stones  . Loop recorder explant  Feb. 18, 2014    removed 3 months ago. at [facility]  . Esophagogastroduodenoscopy (egd) with esophageal dilation N/A 12/19/2012    Procedure: ESOPHAGOGASTRODUODENOSCOPY (EGD) WITH ESOPHAGEAL DILATION;  Surgeon: Malissa Hippo, MD;  Location: [facility] SUITE;  Service: Endoscopy;  Laterality: N/A;  1030   Social History:  reports that she quit smoking about 34 years ago. Her smoking use included Cigarettes. She has a 5 pack-year smoking history. She has never used smokeless tobacco. She reports that she does not drink alcohol or use illicit drugs. Lives at home  alone  No Known Allergies  Family History  Problem Relation Age of Onset  . Heart failure Mother   . Hypertension Mother   . Hyperlipidemia Mother   . Heart attack Mother   . Heart failure Father   . Anesthesia problems Neg Hx   . Hypotension Neg Hx   . Malignant hyperthermia Neg Hx   . Pseudochol deficiency Neg Hx     Prior to Admission medications   Medication Sig Start Date End Date Taking? Authorizing Provider  amitriptyline (ELAVIL) 25 MG tablet Take 25 mg by mouth at bedtime.    Yes Historical Provider, MD  aspirin EC 81 MG tablet Take 1 tablet (81 mg total) by mouth daily. 10/23/10  Yes Laurey Morale, MD  atorvastatin (LIPITOR) 40 MG tablet Take 40 mg by mouth daily.   Yes Historical Provider, MD  Calcium Carbonate-Vit D-Min 1200-1000 MG-UNIT CHEW Chew 1 tablet by mouth 2 (two) times daily.   Yes Historical Provider, MD  diphenoxylate-atropine (LOMOTIL) 2.5-0.025 MG per tablet TAKE ONE TABLET BY MOUTH EVERY 6 HOURS AS NEEDED FOR DIARRHEA 01/17/13  Yes Myra Rude, MD  folic acid (FOLVITE) 1 MG tablet Take 1 mg by mouth daily.     Yes Historical Provider, MD  gabapentin (NEURONTIN) 300 MG capsule Take 1 capsule (300 mg total) by mouth 3 (three) times daily. 03/05/13  Yes Artis Delay, MD  lipase/protease/amylase (CREON-10/PANCREASE) 12000 UNITS CPEP Take 1 capsule by mouth 3 (three) times daily.    Yes Historical Provider, MD  loperamide (IMODIUM) 2 MG capsule Take 2 mg by mouth 4 (four) times daily as needed. For diarrhea    Yes Historical Provider, MD  megestrol (MEGACE) 40 MG/ML suspension Take 200 mg by mouth 2 (two) times daily as needed. For appetite control 09/23/10  Yes Historical Provider, MD  metoCLOPramide (REGLAN) 10 MG tablet Take 10 mg by mouth 2 (two) times daily.    Yes Historical Provider, MD  metoprolol succinate (TOPROL-XL) 100 MG 24 hr tablet Take 1 tablet (100 mg total) by mouth daily. Take with or immediately following a meal. 09/20/12  Yes Laurey Morale, MD   Multiple Vitamin (MULTIVITAMIN) tablet Take 1 tablet by mouth daily.     Yes Historical Provider, MD  nystatin-triamcinolone ointment (MYCOLOG) Apply topically 2 (two) times daily. 02/19/13  Yes Len Blalock, NP  ondansetron (ZOFRAN) 8 MG tablet Take 8 mg by mouth every 8 (eight) hours as needed. For nausea   Yes Historical Provider, MD  oxyCODONE-acetaminophen (PERCOCET/ROXICET) 5-325 MG per tablet Take 1 tablet by mouth every 6 (six) hours as needed for moderate pain or severe pain. Take as directed 12/30/12  Yes Historical Provider, MD  pantoprazole (PROTONIX) 40 MG tablet Take 1 tablet (40 mg total) by mouth 2 (two) times daily before a meal. 03/19/13  Yes Len Blalock, NP  zolpidem (AMBIEN)  10 MG tablet Take 5 mg by mouth at bedtime as needed. For sleep   Yes Historical Provider, MD   Physical Exam: Filed Vitals:   04/30/13 2011  BP: 138/71  Pulse: 78  Temp:   Resp:      General:  A+Ox3, NAD  Eyes: wnl  ENT: wnl  Neck: supple  Cardiovascular: rrr  Respiratory: clear anterior  Abdomen: colostomy bag in place  Skin: no rashes or lesions  Musculoskeletal: moves all 4 ext  Psychiatric: poor eye contact  Neurologic: CN 2-12 intact  Labs on Admission:  Basic Metabolic Panel:  Recent Labs Lab 04/30/13 1830  NA 141  K 4.1  CL 109  CO2 22  GLUCOSE 46*  BUN 18  CREATININE 1.53*  CALCIUM 9.7   Liver Function Tests:  Recent Labs Lab 04/30/13 1830  AST 32  ALT 23  ALKPHOS 82  BILITOT 0.4  PROT 7.0  ALBUMIN 4.2   No results found for this basename: LIPASE, AMYLASE,  in the last 168 hours No results found for this basename: AMMONIA,  in the last 168 hours CBC:  Recent Labs Lab 04/30/13 1830  WBC 4.0  NEUTROABS 2.7  HGB 9.5*  HCT 28.8*  MCV 90.9  PLT 142*   Cardiac Enzymes: No results found for this basename: CKTOTAL, CKMB, CKMBINDEX, TROPONINI,  in the last 168 hours  BNP (last 3 results) No results found for this basename: PROBNP,  in the  last 8760 hours CBG:  Recent Labs Lab 04/30/13 1923 04/30/13 2004  GLUCAP 45* 228*    Radiological Exams on Admission: No results found.    Assessment/Plan Active Problems:   Acute kidney injury secondary to dehydration   UTI (lower urinary tract infection)   Hypoglycemia   1. Hypoglycemia- check blood sugars q 6 hours, patient has a poor appetite 2. UTI- rocephin- await culture 3. AKI- IVF 4. Amyloidosis- follows with Dr. Bertis Ruddy 5. PT eval as patient lives alone    Code Status: full Family Communication: patient Disposition Plan: obs  Time spent: 75 min  Keller Mikels [facility] Pager [phone]  If 7PM-7AM, please contact night-coverage [email or web address] Password [facility] 04/30/2013, 11:33 PM

## 2013-04-30 NOTE — ED Provider Notes (Signed)
Medical screening examination/treatment/procedure(s) were conducted as a shared visit with non-physician practitioner(s) and myself.  I personally evaluated the patient during the encounter.  EKG Interpretation    Date/Time:  Monday April 30 2013 18:16:14 EST Ventricular Rate:  75 PR Interval:  171 QRS Duration: 121 QT Interval:  453 QTC Calculation: 506 R Axis:   -58 Text Interpretation:  Sinus rhythm Ventricular premature complex Left bundle branch block , new from prior           Patient seen and examined. Patient's hyperglycemia treated as well as her likely dehydration. She admits that she has not been eating and drinking appropriately. Suspect that this is the cause of her current symptoms. No signs of acute neurological deficits. Of the patient and ambulate her and likely discharged.  Toy Baker, MD 04/30/13 2133

## 2013-04-30 NOTE — ED Notes (Addendum)
Observed critical blood glucose result during random chart audit. Repeat CBG performed to confirm result. EDPA notified of results. New order obtained.

## 2013-04-30 NOTE — ED Provider Notes (Signed)
CSN: [id number]     Arrival date & time 04/30/13  1753 History   First MD Initiated Contact with Patient 04/30/13 1807     Chief Complaint  Patient presents with  . Abnormal Lab   (Consider location/radiation/quality/duration/timing/severity/associated sxs/prior Treatment) HPI Comments: Patient is a 72 year old female history of hypertension, hyperlipidemia, anemia of chronic disease, syncope, amyloidosis, cancer who presents today for generalized weakness. Earlier today she was in the bathroom and taking her colostomy site when she began to feel very weak. She knew that there are other people in the room, but does not remember what they were saying to her. She is a poor historian and reports next thing she knew she was in the hospital. There are no members of her family that were present at the time, but a daughter reports that per their aunt she syncopized. She had a physical exam last week where routine blood was drawn. She was found to be anemic at that time. She was instructed by her doctor to come to the hospital. She denies any new shortness of breath stating "it always is hard to breathe". She denies any darkening of her stools. She reports that her stools have recently become very light in color, but her most recent stool was light brown.   The history is provided by the patient. No language interpreter was used.    Past Medical History  Diagnosis Date  . History of CEA (carotid endarterectomy) 03/2007    Left CEA by Dr. Cari Caraway  . Hypertension   . Hyperlipidemia   . GERD (gastroesophageal reflux disease)   . Anemia in chronic kidney disease(285.21) 02/2009    She has been on Aranesp for ? anemia of chronic kidney disease though renal function does not seem severely impaired.  She had repeat bone marrow biopsy by Dr. Cleone Slim 9/10.  Marland Kitchen Episode of syncope     dysauthonomia/ orthostatic syncope - Patient has Medtronic loop recorder implanted 8/10.  No events so far - May be due only  to orthostasis  . 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    . History of MRI 01/2009    CardiacMRI/EF 66%, mild to moderate concentric LVH, RV size & systolic function normAL w/o evidence for ARVC, trivial pericardial effusion/On delayed enhancement, it was difficult to obtain the TI/There was patchy mid-wall basal delayed enhancement/There was partially circumferential subendocardialdelayed enhancement in the mid to apical LV/This was suggestive of infiltrative cardiomyopathy versus  . History of CT scan of chest 01/2009    to assess for sarcoidosis: no evidence by CT for pulmonary sarcoid  . 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   . Secretory diarrhea      treated with octreotide.   . Esophageal stricture 05/2010    with dilation  . Amyloidosis   . Shortness of breath     when tired  . Cancer 2010    Cancer of the Blood stream  . Chronic back pain 04/10/2013   Past Surgical History  Procedure Laterality Date  . Implantable loop recorder  2010    MDT for unexplained syncope  . Partial colectomy  1/11    for diverticular perforation  . Carotid endarterectomy  10/08    left  . Abdominal hysterectomy    . Upper gastrointestinal endoscopy  05/20/2010    EGD ED  . Colonoscopy  03/14/2009  .  Ercp  04/09/2011    Procedure: ENDOSCOPIC RETROGRADE CHOLANGIOPANCREATOGRAPHY (ERCP);  Surgeon: Malissa Hippo, MD;  Location: [facility];  Service: Endoscopy;  Laterality: N/A;  10:25  . Sphincterotomy  04/09/2011    Procedure: SPHINCTEROTOMY;  Surgeon: Malissa Hippo, MD;  Location: [facility];  Service: Endoscopy;  Laterality: N/A;  with removal of multiple pigment stones  . Loop recorder explant  Feb. 18, 2014    removed 3 months ago. at [facility]  . Esophagogastroduodenoscopy (egd) with esophageal dilation N/A 12/19/2012    Procedure:  ESOPHAGOGASTRODUODENOSCOPY (EGD) WITH ESOPHAGEAL DILATION;  Surgeon: Malissa Hippo, MD;  Location: [facility];  Service: Endoscopy;  Laterality: N/A;  1030   Family History  Problem Relation Age of Onset  . Heart failure Mother   . Hypertension Mother   . Hyperlipidemia Mother   . Heart attack Mother   . Heart failure Father   . Anesthesia problems Neg Hx   . Hypotension Neg Hx   . Malignant hyperthermia Neg Hx   . Pseudochol deficiency Neg Hx    History  Substance Use Topics  . Smoking status: Former Smoker -- 1.00 packs/day for 5 years    Types: Cigarettes    Quit date: 09/07/1978  . Smokeless tobacco: Never Used  . Alcohol Use: No   OB History   Grav Para Term Preterm Abortions TAB SAB Ect Mult Living                 Review of Systems  Constitutional: Negative for fever and chills.  Respiratory: Negative for shortness of breath.   Cardiovascular: Negative for chest pain.  Gastrointestinal: Negative for abdominal pain.  Neurological: Positive for weakness.  All other systems reviewed and are negative.    Allergies  Review of patient's allergies indicates no known allergies.  Home Medications   Current Outpatient Rx  Name  Route  Sig  Dispense  Refill  . amitriptyline (ELAVIL) 25 MG tablet   Oral   Take 25 mg by mouth at bedtime.          Marland Kitchen aspirin EC 81 MG tablet   Oral   Take 1 tablet (81 mg total) by mouth daily.         Marland Kitchen atorvastatin (LIPITOR) 40 MG tablet   Oral   Take 40 mg by mouth daily.         . Calcium Carbonate-Vit D-Min 1200-1000 MG-UNIT CHEW   Oral   Chew 1 tablet by mouth 2 (two) times daily.         . diphenoxylate-atropine (LOMOTIL) 2.5-0.025 MG per tablet      TAKE ONE TABLET BY MOUTH EVERY 6 HOURS AS NEEDED FOR DIARRHEA   120 tablet   2   . folic acid (FOLVITE) 1 MG tablet   Oral   Take 1 mg by mouth daily.           Marland Kitchen gabapentin (NEURONTIN) 300 MG capsule   Oral   Take 1 capsule (300 mg total) by mouth 3  (three) times daily.   90 capsule   6   . lipase/protease/amylase (CREON-10/PANCREASE) 12000 UNITS CPEP   Oral   Take 1 capsule by mouth 3 (three) times daily.          Marland Kitchen loperamide (IMODIUM) 2 MG capsule   Oral   Take 2 mg by mouth 4 (four) times daily as needed. For diarrhea          . megestrol (  MEGACE) 40 MG/ML suspension   Oral   Take 200 mg by mouth 2 (two) times daily as needed. For appetite control         . metoCLOPramide (REGLAN) 10 MG tablet   Oral   Take 10 mg by mouth 2 (two) times daily.          . metoprolol succinate (TOPROL-XL) 100 MG 24 hr tablet   Oral   Take 1 tablet (100 mg total) by mouth daily. Take with or immediately following a meal.   30 tablet   6   . Multiple Vitamin (MULTIVITAMIN) tablet   Oral   Take 1 tablet by mouth daily.           Marland Kitchen nystatin-triamcinolone ointment (MYCOLOG)   Topical   Apply topically 2 (two) times daily.   30 g   0   . ondansetron (ZOFRAN) 8 MG tablet   Oral   Take 8 mg by mouth every 8 (eight) hours as needed. For nausea         . oxyCODONE-acetaminophen (PERCOCET/ROXICET) 5-325 MG per tablet   Oral   Take 1 tablet by mouth every 6 (six) hours as needed for moderate pain or severe pain. Take as directed         . pantoprazole (PROTONIX) 40 MG tablet   Oral   Take 1 tablet (40 mg total) by mouth 2 (two) times daily before a meal.   60 tablet   2   . zolpidem (AMBIEN) 10 MG tablet   Oral   Take 5 mg by mouth at bedtime as needed. For sleep          BP 99/57  Pulse 75  Temp(Src) 97.8 F (36.6 C) (Oral)  Resp 13  SpO2 98% Physical Exam  Nursing note and vitals reviewed. Constitutional: She is oriented to person, place, and time. She appears well-developed and well-nourished. No distress.  HENT:  Head: Normocephalic and atraumatic.  Right Ear: External ear normal.  Left Ear: External ear normal.  Nose: Nose normal.  Mouth/Throat: Oropharynx is clear and moist.  Eyes: Conjunctivae are  normal.  Neck: Normal range of motion.  Cardiovascular: Normal rate, regular rhythm and normal heart sounds.   Pulmonary/Chest: Effort normal and breath sounds normal. No stridor. No respiratory distress. She has no wheezes. She has no rales.  Abdominal: Soft. She exhibits no distension. There is no tenderness. There is no rigidity, no rebound and no guarding.  Colostomy bag in place. No erythema, streaking, tenderness around colostomy site.  Musculoskeletal: Normal range of motion.  Neurological: She is alert and oriented to person, place, and time. She has normal strength.  Skin: Skin is warm and dry. She is not diaphoretic. No erythema.  Psychiatric: She has a normal mood and affect. Her behavior is normal.    ED Course  Procedures (including critical care time) Labs Review Labs Reviewed  CBC WITH DIFFERENTIAL - Abnormal; Notable for the following:    RBC 3.17 (*)    Hemoglobin 9.5 (*)    HCT 28.8 (*)    Platelets 142 (*)    All other components within normal limits  COMPREHENSIVE METABOLIC PANEL - Abnormal; Notable for the following:    Glucose, Bld 46 (*)    Creatinine, Ser 1.53 (*)    GFR calc non Af Amer 33 (*)    GFR calc Af Amer 38 (*)    All other components within normal limits  URINALYSIS, ROUTINE W REFLEX MICROSCOPIC -  Abnormal; Notable for the following:    APPearance CLOUDY (*)    Hgb urine dipstick TRACE (*)    Nitrite POSITIVE (*)    Leukocytes, UA LARGE (*)    All other components within normal limits  GLUCOSE, CAPILLARY - Abnormal; Notable for the following:    Glucose-Capillary 45 (*)    All other components within normal limits  GLUCOSE, CAPILLARY - Abnormal; Notable for the following:    Glucose-Capillary 228 (*)    All other components within normal limits  URINE MICROSCOPIC-ADD ON - Abnormal; Notable for the following:    Bacteria, UA MANY (*)    Casts HYALINE CASTS (*)    All other components within normal limits  URINE CULTURE  PROTIME-INR  APTT   CG4 I-STAT (LACTIC ACID)  POCT I-STAT TROPONIN I  TYPE AND SCREEN   Imaging Review No results found.  EKG Interpretation    Date/Time:  Monday April 30 2013 18:16:14 EST Ventricular Rate:  75 PR Interval:  171 QRS Duration: 121 QT Interval:  453 QTC Calculation: 506 R Axis:   -58 Text Interpretation:  Sinus rhythm Ventricular premature complex Left bundle branch block , new from prior            MDM   1. UTI (lower urinary tract infection)   2. Acute kidney injury   3. Amyloidosis   4. Hypoglycemia    Patient initially presents to emergency department for worsening generalized weakness. She recently had lab work done at her primary care office which showed some mild anemia. Patient was found to have a urinary tract infection. I suspect this is likely the cause of her weakness. She has a small bump and her creatinine. She was found to have a blood sugar of 45 on arrival. She was given one amp of D50 and CBG was 228. She lives alone and does not feel safe at home. We will admit for observation. Rocephin has been given in the emergency department. Vital signs are stable. Dr. Freida Busman evaluated this patient and agrees with plan.    Mora Bellman, PA-C 05/01/13 [phone]

## 2013-04-30 NOTE — ED Notes (Signed)
Bed: WA07 Expected date:  Expected time:  Means of arrival:  Comments: CA pt low blood

## 2013-05-01 DIAGNOSIS — D709 Neutropenia, unspecified: Secondary | ICD-10-CM

## 2013-05-01 DIAGNOSIS — D72819 Decreased white blood cell count, unspecified: Secondary | ICD-10-CM | POA: Diagnosis present

## 2013-05-01 DIAGNOSIS — E43 Unspecified severe protein-calorie malnutrition: Secondary | ICD-10-CM | POA: Diagnosis present

## 2013-05-01 DIAGNOSIS — D696 Thrombocytopenia, unspecified: Secondary | ICD-10-CM | POA: Diagnosis present

## 2013-05-01 DIAGNOSIS — D638 Anemia in other chronic diseases classified elsewhere: Secondary | ICD-10-CM

## 2013-05-01 LAB — BASIC METABOLIC PANEL
BUN: 13 mg/dL (ref 6–23)
Chloride: 111 mEq/L (ref 96–112)
GFR calc Af Amer: 50 mL/min — ABNORMAL LOW (ref >90)
GFR calc non Af Amer: 43 mL/min — ABNORMAL LOW (ref >90)
Potassium: 4.1 mEq/L (ref 3.5–5.1)
Sodium: 142 mEq/L (ref 135–145)

## 2013-05-01 LAB — GLUCOSE, CAPILLARY
Glucose-Capillary: 101 mg/dL — ABNORMAL HIGH (ref 70–99)
Glucose-Capillary: 104 mg/dL — ABNORMAL HIGH (ref 70–99)
Glucose-Capillary: 107 mg/dL — ABNORMAL HIGH (ref 70–99)

## 2013-05-01 LAB — CBC
Hemoglobin: 8.5 g/dL — ABNORMAL LOW (ref 12.0–15.0)
MCHC: 32.8 g/dL (ref 30.0–36.0)
RBC: 2.86 MIL/uL — ABNORMAL LOW (ref 3.87–5.11)
WBC: 3.8 10*3/uL — ABNORMAL LOW (ref 4.0–10.5)

## 2013-05-01 MED ORDER — PANCRELIPASE (LIP-PROT-AMYL) 12000-38000 UNITS PO CPEP
1.0000 | ORAL_CAPSULE | Freq: Three times a day (TID) | ORAL | Status: DC
  Administered 2013-05-01 – 2013-05-03 (×7): 1 via ORAL
  Filled 2013-05-01 (×9): qty 1

## 2013-05-01 MED ORDER — BOOST / RESOURCE BREEZE PO LIQD
1.0000 | Freq: Two times a day (BID) | ORAL | Status: DC
  Administered 2013-05-03: 1 via ORAL

## 2013-05-01 MED ORDER — GABAPENTIN 300 MG PO CAPS
300.0000 mg | ORAL_CAPSULE | Freq: Three times a day (TID) | ORAL | Status: DC
  Administered 2013-05-01 – 2013-05-03 (×7): 300 mg via ORAL
  Filled 2013-05-01 (×9): qty 1

## 2013-05-01 MED ORDER — ATORVASTATIN CALCIUM 40 MG PO TABS
40.0000 mg | ORAL_TABLET | Freq: Every day | ORAL | Status: DC
  Administered 2013-05-01 – 2013-05-02 (×2): 40 mg via ORAL
  Filled 2013-05-01 (×3): qty 1

## 2013-05-01 MED ORDER — AMITRIPTYLINE HCL 25 MG PO TABS
25.0000 mg | ORAL_TABLET | Freq: Every day | ORAL | Status: DC
  Administered 2013-05-01 – 2013-05-02 (×3): 25 mg via ORAL
  Filled 2013-05-01 (×4): qty 1

## 2013-05-01 MED ORDER — DEXTROSE 5 % IV SOLN
1.0000 g | INTRAVENOUS | Status: DC
  Administered 2013-05-01 – 2013-05-02 (×2): 1 g via INTRAVENOUS
  Filled 2013-05-01 (×3): qty 10

## 2013-05-01 MED ORDER — ACETAMINOPHEN 650 MG RE SUPP: 650.0000 mg | Freq: Four times a day (QID) | RECTAL | Status: DC | PRN

## 2013-05-01 MED ORDER — DIPHENOXYLATE-ATROPINE 2.5-0.025 MG PO TABS
1.0000 | ORAL_TABLET | Freq: Four times a day (QID) | ORAL | Status: DC
  Administered 2013-05-01 – 2013-05-03 (×7): 1 via ORAL
  Filled 2013-05-01 (×6): qty 1

## 2013-05-01 MED ORDER — MEGESTROL ACETATE 40 MG/ML PO SUSP
200.0000 mg | Freq: Two times a day (BID) | ORAL | Status: DC | PRN
  Filled 2013-05-01: qty 5

## 2013-05-01 MED ORDER — METOPROLOL SUCCINATE ER 100 MG PO TB24
100.0000 mg | ORAL_TABLET | Freq: Every day | ORAL | Status: DC
  Administered 2013-05-01 – 2013-05-03 (×3): 100 mg via ORAL
  Filled 2013-05-01 (×3): qty 1

## 2013-05-01 MED ORDER — FOLIC ACID 1 MG PO TABS
1.0000 mg | ORAL_TABLET | Freq: Every day | ORAL | Status: DC
  Administered 2013-05-01 – 2013-05-03 (×3): 1 mg via ORAL
  Filled 2013-05-01 (×3): qty 1

## 2013-05-01 MED ORDER — METOCLOPRAMIDE HCL 10 MG PO TABS
10.0000 mg | ORAL_TABLET | Freq: Two times a day (BID) | ORAL | Status: DC
Start: 2013-05-01 — End: 2013-05-03
  Administered 2013-05-01 – 2013-05-03 (×5): 10 mg via ORAL
  Filled 2013-05-01 (×7): qty 1

## 2013-05-01 MED ORDER — ACETAMINOPHEN 325 MG PO TABS: 650.0000 mg | ORAL_TABLET | Freq: Four times a day (QID) | ORAL | Status: DC | PRN

## 2013-05-01 MED ORDER — OXYCODONE-ACETAMINOPHEN 5-325 MG PO TABS
1.0000 | ORAL_TABLET | Freq: Four times a day (QID) | ORAL | Status: DC | PRN
  Administered 2013-05-01 – 2013-05-03 (×6): 1 via ORAL
  Filled 2013-05-01 (×6): qty 1

## 2013-05-01 MED ORDER — ASPIRIN EC 81 MG PO TBEC
81.0000 mg | DELAYED_RELEASE_TABLET | Freq: Every day | ORAL | Status: DC
  Administered 2013-05-01 – 2013-05-03 (×3): 81 mg via ORAL
  Filled 2013-05-01 (×3): qty 1

## 2013-05-01 MED ORDER — LOPERAMIDE HCL 2 MG PO CAPS: 2.0000 mg | ORAL_CAPSULE | ORAL | Status: DC | PRN

## 2013-05-01 MED ORDER — PANTOPRAZOLE SODIUM 40 MG PO TBEC
40.0000 mg | DELAYED_RELEASE_TABLET | Freq: Two times a day (BID) | ORAL | Status: DC
  Administered 2013-05-01 – 2013-05-03 (×5): 40 mg via ORAL
  Filled 2013-05-01 (×8): qty 1

## 2013-05-01 NOTE — Care Management Note (Signed)
CM spoke with patient and caregiver [name] at the bedside concerning dc planning. Pt receives private duty care services from [employer] in [place]. Pt disposition to home. MD order for PT eval. Pt provided choice list for [facility]. Awaiting PT eval to further assess dc needs.    Roxy Manns Orah Sonnen,RN,MSN [phone]

## 2013-05-01 NOTE — Progress Notes (Signed)
Patient states that she receives Sandostatin IM at [facility]  Q 28 days. Next injection due next Tuesday. Called to DrGoruch's RN & left message that Pt was Inpatient at t his time.[name]

## 2013-05-01 NOTE — Evaluation (Signed)
Physical Therapy Evaluation Patient Details Name: ADDALYNN KUMARI MRN: [id number] DOB: 07-26-1940 Today's Date: 05/01/2013 Time: 4098-1191 PT Time Calculation (min): 20 min  PT Assessment / Plan / Recommendation History of Present Illness  pt admitted 04/30/13 with near syncope episode and pt found to by hypglycemic in ED  Pt with extensive medical history including CKD, colostomy, HTN and autonomic orthostatis in the past  Clinical Impression  Pt has fatigue after ambulation , but appears to be at baseline.  She would benefit from acute PT while she and is an inpatient to practice steps prior to d/c.  Recommend she walk with nursing and family ad lib for endurance work.    PT Assessment  Patent does not need any further PT services    Follow Up Recommendations  No PT follow up    Does the patient have the potential to tolerate intense rehabilitation      Barriers to Discharge        Equipment Recommendations  None recommended by PT    Recommendations for Other Services     Frequency      Precautions / Restrictions Precautions Precautions: Fall   Pertinent Vitals/Pain No c/o pain, pt reports history of numbness in Rt. Leg all the way to arm.      Mobility  Bed Mobility Bed Mobility: Supine to Sit;Sit to Supine Supine to Sit: 5: Supervision Sit to Supine: 5: Supervision Transfers Transfers: Sit to Stand;Stand to Sit Sit to Stand: 5: Supervision Stand to Sit: 5: Supervision Details for Transfer Assistance: Pt able to go to bathroom for urination including pericare and hand washing with supervision only Ambulation/Gait Ambulation/Gait Assistance: 5: Supervision Ambulation Distance (Feet): 300 Feet Assistive device: Rolling walker Ambulation/Gait Assistance Details: pt appears comfortable with RW, Gait Pattern: Within Functional Limits;Step-through pattern Gait velocity: pt chooses a funtional pace General Gait Details: no loss of balance with RW, though pt was  fatigued after ambulation Stairs: No Wheelchair Mobility Wheelchair Mobility: No    Exercises     PT Diagnosis:    PT Problem List:   PT Treatment Interventions:       PT Goals(Current goals can be found in the care plan section) Acute Rehab PT Goals Patient Stated Goal: to go home with assist as prior to admission PT Goal Formulation: With patient Time For Goal Achievement: 05/08/13 Potential to Achieve Goals: Good  Visit Information  Last PT Received On: 05/01/13 History of Present Illness: pt admitted 04/30/13 with near syncope episode and pt found to by hypglycemic in ED  Pt with extensive medical history including CKD, colostomy, HTN and autonomic orthostatis in the past       Prior Functioning  Home Living Family/patient expects to be discharged to:: Private residence Living Arrangements: Alone;Non-relatives/Friends;Other relatives (pt reports "extensive support system") Available Help at Discharge: Friend(s);Personal care attendant Type of Home: House Home Access: Stairs to enter [employer] of Steps: 2 Entrance Stairs-Rails: Right Home Layout: One level Home Equipment: Walker - 2 wheels;Cane - single point Additional Comments: Pt states she has caregives in home every day for a couple hrs Prior Function Level of Independence: Independent with assistive device(s) Communication Communication: No difficulties    Cognition  Cognition Arousal/Alertness: Awake/alert Behavior During Therapy: WFL for tasks assessed/performed Overall Cognitive Status: Within Functional Limits for tasks assessed    Extremity/Trunk Assessment Lower Extremity Assessment Lower Extremity Assessment: Generalized weakness;Overall [facility] for tasks assessed Cervical / Trunk Assessment Cervical / Trunk Assessment: Kyphotic;Other exceptions Cervical / Trunk Exceptions:  pt thin with diffuse muscle atrophy   Balance Balance Balance Assessed: Yes Static Sitting Balance Static Sitting  - Balance Support: No upper extremity supported Static Sitting - Level of Assistance: 7: Independent Static Standing Balance Static Standing - Balance Support: Bilateral upper extremity supported Static Standing - Level of Assistance: 6: Modified independent (Device/Increase time)  End of Session PT - End of Session Activity Tolerance: Patient tolerated treatment well Patient left: in chair;with nursing/sitter in room Nurse Communication: Mobility status  GP    Bayard Hugger. [place], [place] [phone] 05/01/2013, 3:06 PM

## 2013-05-01 NOTE — Progress Notes (Signed)
INITIAL NUTRITION ASSESSMENT  Pt meets criteria for severe MALNUTRITION in the context of chronic illness as evidenced by <75% estimated energy intake in the past few months in addition to pt with severe muscle wasting and subcutaneous fat loss in legs.  DOCUMENTATION CODES Per approved criteria  -Severe malnutrition in the context of chronic illness -Underweight   INTERVENTION: - Resource Breeze BID - Encouraged increased meal intake - Will continue to monitor   NUTRITION DIAGNOSIS: Increased nutrient needs related to underweight as evidenced by BMI of 17.8 kg/(m^2).   Goal: Pt to consume >90% of meals/supplements  Monitor:  Weights, labs, intake  Reason for Assessment: Nutrition risk   72 y.o. female  Admitting Dx: UTI (lower urinary tract infection)  ASSESSMENT: Pt discussed during multidisciplinary rounds.   Admitted with syncope and low blood sugar, < 40 mg/dL. Hx of diverticulitis with colostomy. Met with pt and friend who reported pt with on/off intake PTA. Pt reports eating at least 1 good meal/day and tries to snack every 2 hours - enjoys applesauce and jello. Used to drink Ensure but doesn't currently. Likes Ensure Clear. Has been on Megace for the past 1.5 years but denies any improvement in appetite, however pt admits she does not take it regularly. Has been on Creon for the past 2 years which she takes regularly. Reports eating a good dinner yesterday and ate 50% of breakfast this morning. Reports losing 10 pounds unintentionally in the past year.   Nutrition Focused Physical Exam:  Subcutaneous Fat:  Orbital Region: mild/moderate wasting Upper Arm Region: severe wasting Thoracic and Lumbar Region: NA  Muscle:  Temple Region: mild/moderate wasting Clavicle Bone Region: mild/moderate wasting Clavicle and Acromion Bone Region: mild/moderate wasting Scapular Bone Region: NA Dorsal Hand: severe wasting Patellar Region: severe wasting Anterior Thigh Region:  severe wasting Posterior Calf Region: severe wasting  Edema: None noted   Height: Ht Readings from Last 1 Encounters:  05/01/13 5\' 3"  (1.6 m)    Weight: Wt Readings from Last 1 Encounters:  05/01/13 100 lb 12 oz (45.7 kg)    Ideal Body Weight: 115 lb   % Ideal Body Weight: 87%  Wt Readings from Last 10 Encounters:  05/01/13 100 lb 12 oz (45.7 kg)  04/17/13 100 lb (45.36 kg)  04/10/13 102 lb 14.4 oz (46.675 kg)  04/05/13 101 lb (45.813 kg)  02/01/13 105 lb 6.4 oz (47.809 kg)  01/17/13 106 lb 4.8 oz (48.217 kg)  12/27/12 105 lb (47.628 kg)  11/28/12 108 lb 6.4 oz (49.17 kg)  09/20/12 108 lb (48.988 kg)  09/18/12 109 lb 1.6 oz (49.487 kg)    Usual Body Weight: 110 lb   % Usual Body Weight: 91%  BMI:  Body mass index is 17.85 kg/(m^2). Underweight  Estimated Nutritional Needs: Kcal: 1400-1600 Protein: 60-70g Fluid: 1.4-1.6L/day  Skin: Intact   Diet Order: General  EDUCATION NEEDS: -No education needs identified at this time   Intake/Output Summary (Last 24 hours) at 05/01/13 1212 Last data filed at 05/01/13 1158  Gross per 24 hour  Intake    480 ml  Output   1201 ml  Net   -721 ml    Last BM: 11/18 from colostomy  Labs:   Recent Labs Lab 04/30/13 1830 05/01/13 0347  NA 141 142  K 4.1 4.1  CL 109 111  CO2 22 22  BUN 18 13  CREATININE 1.53* 1.23*  CALCIUM 9.7 8.8  GLUCOSE 46* 109*    CBG (last 3)  Recent Labs  05/01/13 0119 05/01/13 0605 05/01/13 1141  GLUCAP 101* 104* 107*    Scheduled Meds: . amitriptyline  25 mg Oral QHS  . aspirin EC  81 mg Oral Daily  . atorvastatin  40 mg Oral q1800  . cefTRIAXone (ROCEPHIN)  IV  1 g Intravenous Q24H  . folic acid  1 mg Oral Daily  . gabapentin  300 mg Oral TID  . lipase/protease/amylase  1 capsule Oral TID  . metoCLOPramide  10 mg Oral BID  . metoprolol succinate  100 mg Oral Daily  . pantoprazole  40 mg Oral BID AC    Continuous Infusions: . sodium chloride 125 mL/hr at 04/30/13  1930    Past Medical History  Diagnosis Date  . History of CEA (carotid endarterectomy) 03/2007    Left CEA by Dr. Cari Caraway  . Hypertension   . Hyperlipidemia   . GERD (gastroesophageal reflux disease)   . Anemia in chronic kidney disease(285.21) 02/2009    She has been on Aranesp for ? anemia of chronic kidney disease though renal function does not seem severely impaired.  She had repeat bone marrow biopsy by Dr. Cleone Slim 9/10.  Marland Kitchen Episode of syncope     dysauthonomia/ orthostatic syncope - Patient has Medtronic loop recorder implanted 8/10.  No events so far - May be due only to orthostasis  . 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    . History of MRI 01/2009    CardiacMRI/EF 66%, mild to moderate concentric LVH, RV size & systolic function normAL w/o evidence for ARVC, trivial pericardial effusion/On delayed enhancement, it was difficult to obtain the TI/There was patchy mid-wall basal delayed enhancement/There was partially circumferential subendocardialdelayed enhancement in the mid to apical LV/This was suggestive of infiltrative cardiomyopathy versus  . History of CT scan of chest 01/2009    to assess for sarcoidosis: no evidence by CT for pulmonary sarcoid  . 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   . Secretory diarrhea      treated with octreotide.   . Esophageal stricture 05/2010    with dilation  . Amyloidosis   . Shortness of breath     when tired  . Cancer 2010    Cancer of the Blood stream  . Chronic back pain 04/10/2013    Past Surgical History  Procedure Laterality Date  . Implantable loop recorder  2010    MDT for unexplained syncope  . Partial colectomy  1/11    for diverticular perforation  . Carotid endarterectomy  10/08    left  . Abdominal hysterectomy    . Upper gastrointestinal endoscopy  05/20/2010    EGD  ED  . Colonoscopy  03/14/2009  . Ercp  04/09/2011    Procedure: ENDOSCOPIC RETROGRADE CHOLANGIOPANCREATOGRAPHY (ERCP);  Surgeon: Malissa Hippo, MD;  Location: [facility];  Service: Endoscopy;  Laterality: N/A;  10:25  . Sphincterotomy  04/09/2011    Procedure: SPHINCTEROTOMY;  Surgeon: Malissa Hippo, MD;  Location: [facility];  Service: Endoscopy;  Laterality: N/A;  with removal of multiple pigment stones  . Loop recorder explant  Feb. 18, 2014    removed 3 months ago. at [facility]  . Esophagogastroduodenoscopy (egd) with esophageal dilation N/A 12/19/2012    Procedure: ESOPHAGOGASTRODUODENOSCOPY (EGD) WITH ESOPHAGEAL DILATION;  Surgeon: Malissa Hippo, MD;  Location: [facility];  Service: Endoscopy;  Laterality: N/A;  [address] [place], [place], [place] [phone] Pager [phone] After Hours Pager

## 2013-05-01 NOTE — Progress Notes (Signed)
[facility] PROGRESS NOTE  [name]:[id number] DOB: [birth year] DOA: 04/30/2013 PCP: Rudi Heap, MD  Brief narrative: 72 year old female with past medical history significant for but not limited to neuropathy, dyslipidemia, hypertension, amyloidosis, colostomy who presented to [facility] ED 04/30/2013 with complaints of weakness, altered mental status and dizziness 1 day prior to this admission. In ED, BP was 80/50 and it has improved to 148/80 with IV fluids given in ED. HR was 74 and T max 98.3 F. O2 saturation was 97% on room air. Patient was subsequently found to have UTI and was started on Rocephin. WBC count was WNL. Creatinine was elevated at 1.53.  Assessment and Plan:  Principal Problem:   UTI (lower urinary tract infection) - large leukocytes seen on urinalysis - follow up urine culture results - continue rocephin 1 gm daily IV - mental status improved to bseline, oriented to time, place and person Active Problems:   HYPERTENSION, UNSPECIFIED - continue Toprol XL 100 mg daily   Dyslipidemia - continue statin therapy   Acute kidney injury secondary to dehydration - creatinine now improving with IV fluids, 1.53 --> 1.23   Anemia of chronic disease - secondary to history of amyloidosis - hemoglobin 9.5 on admission and this am 8.5 - follow up CBC in am - continue folic acid   Neutropenia - slightly lower WBC this am, 3.8 - will continue to monitor CBC   Thrombocytopenia, unspecified - d/c Lovenox as it may have contributed to drop in platelet count, 142 --> 130 - change to SCD's for DVT prophylaxis   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  Objective: Filed Vitals:   04/30/13 2010 04/30/13 2011 05/01/13 0120 05/01/13 0520  BP: 148/80 138/71 138/83 139/70  Pulse: 76 78 77 85  Temp:   98.3 F (36.8 C) 97.9 F (36.6 C)  TempSrc:   Oral Oral  Resp:   18 16  Height:   5\' 3"  (1.6 m)   Weight:   45.7 kg (100 lb 12 oz)   SpO2:   98% 100%    Intake/Output Summary (Last 24 hours) at 05/01/13 0655 Last data filed at 05/01/13 0521  Gross per 24 hour  Intake    240 ml  Output    400 ml  Net   -160 ml    Exam:   General:  Pt is alert, follows commands appropriately, not in acute distress  Cardiovascular: Regular rate and rhythm, S1/S2 appreciated, SEM appreciated  Respiratory: Clear to auscultation bilaterally, no wheezing, no crackles, no rhonchi  Abdomen: Soft, non tender, non distended, bowel sounds present, colostomy  Extremities: No edema, pulses DP and PT palpable bilaterally  Neuro: Grossly nonfocal  Data Reviewed: Basic Metabolic Panel:  Recent Labs Lab 04/30/13 1830 05/01/13 0347  NA 141 142  K 4.1 4.1  CL 109 111  CO2 22 22  GLUCOSE 46* 109*  BUN 18 13  CREATININE 1.53*  1.23*  CALCIUM 9.7 8.8   Liver Function Tests:  Recent Labs Lab 04/30/13 1830  AST 32  ALT 23  ALKPHOS 82  BILITOT 0.4  PROT 7.0  ALBUMIN 4.2   No results found for this basename: LIPASE, AMYLASE,  in the last 168 hours No results found for this basename: AMMONIA,  in the last 168 hours CBC:  Recent Labs Lab 04/30/13 1830 05/01/13 0347  WBC 4.0 3.8*  NEUTROABS 2.7  --   HGB 9.5* 8.5*  HCT 28.8* 25.9*  MCV 90.9 90.6  PLT 142* 130*   Cardiac Enzymes: No results found for this basename: CKTOTAL, CKMB, CKMBINDEX, TROPONINI,  in  the last 168 hours BNP: No components found with this basename: POCBNP,  CBG:  Recent Labs Lab 04/30/13 1923 04/30/13 2004 05/01/13 0119 05/01/13 0605  GLUCAP 45* 228* 101* 104*    No results found for this or any previous visit (from the past 240 hour(s)).   Studies: No results found.  Scheduled Meds: . amitriptyline  25 mg Oral QHS  . aspirin EC  81 mg Oral Daily  . atorvastatin  40 mg Oral q1800  . cefTRIAXone (ROCEPHIN)  IV  1 g Intravenous Q24H  . folic acid  1 mg Oral Daily  . gabapentin  300 mg Oral TID  . lipase/protease/amylase  1 capsule Oral TID  . metoCLOPramide  10 mg Oral BID  . metoprolol succinate  100 mg Oral Daily  . pantoprazole  40 mg Oral BID AC   Continuous Infusions: . sodium chloride 125 mL/hr at 04/30/13 1930

## 2013-05-01 NOTE — Progress Notes (Signed)
Utilization review completed.  

## 2013-05-01 NOTE — ED Provider Notes (Signed)
Medical screening examination/treatment/procedure(s) were conducted as a shared visit with non-physician practitioner(s) and myself.  I personally evaluated the patient during the encounter.  EKG Interpretation    Date/Time:  Monday April 30 2013 18:16:14 EST Ventricular Rate:  75 PR Interval:  171 QRS Duration: 121 QT Interval:  453 QTC Calculation: 506 R Axis:   -58 Text Interpretation:  Sinus rhythm Ventricular premature complex Left bundle branch block , new from prior             Toy Baker, MD 05/01/13 2323

## 2013-05-02 DIAGNOSIS — R55 Syncope and collapse: Secondary | ICD-10-CM | POA: Diagnosis present

## 2013-05-02 DIAGNOSIS — G629 Polyneuropathy, unspecified: Secondary | ICD-10-CM | POA: Diagnosis present

## 2013-05-02 LAB — CBC
MCH: 30.7 pg (ref 26.0–34.0)
MCHC: 34.1 g/dL (ref 30.0–36.0)
MCV: 89.9 fL (ref 78.0–100.0)
Platelets: 118 10*3/uL — ABNORMAL LOW (ref 150–400)
RBC: 2.77 MIL/uL — ABNORMAL LOW (ref 3.87–5.11)
WBC: 3.5 10*3/uL — ABNORMAL LOW (ref 4.0–10.5)

## 2013-05-02 LAB — BASIC METABOLIC PANEL
BUN: 9 mg/dL (ref 6–23)
CO2: 23 mEq/L (ref 19–32)
Calcium: 8 mg/dL — ABNORMAL LOW (ref 8.4–10.5)
Chloride: 106 mEq/L (ref 96–112)
Creatinine, Ser: 0.91 mg/dL (ref 0.50–1.10)
Glucose, Bld: 119 mg/dL — ABNORMAL HIGH (ref 70–99)

## 2013-05-02 LAB — URINE CULTURE: Colony Count: >=100000

## 2013-05-02 LAB — GLUCOSE, CAPILLARY
Glucose-Capillary: 91 mg/dL (ref 70–99)
Glucose-Capillary: 104 mg/dL — ABNORMAL HIGH (ref 70–99)

## 2013-05-02 NOTE — Care Management Note (Signed)
   CARE MANAGEMENT NOTE 05/02/2013  Patient:  [name], [name]   Account Number:  [id number]  Date Initiated:  05/02/2013  Documentation initiated by:  Nereyda Bowler  Subjective/Objective Assessment:   72 yo female admitted with acute kidney injury secondary to dehydration. PTA pt from home.     Action/Plan:   Home when stable   Anticipated DC Date:     Anticipated DC Plan:  HOME/SELF CARE      DC Planning Services  CM consult      Choice offered to / List presented to:  C-1 Patient   DME arranged  NA      DME agency  NA     HH arranged  NA      HH agency  NA   Status of service:  Completed, signed off Medicare Important Message given?   (If response is "NO", the following Medicare IM given date fields will be blank) Date Medicare IM given:   Date Additional Medicare IM given:    Discharge Disposition:    Per UR Regulation:  Reviewed for med. necessity/level of care/duration of stay  If discussed at Long Length of Stay Meetings, dates discussed:    Comments:  05/02/13 1015 Ravin Bendall,RN,MSN [phone] No PT services required per PT eval. No barriers to discharge identified. Pt dispostion home with personal care provided by [facility] in [place]. Pt has an ostomy. Ostomy supplies provided by [facility] company in [place].  Sharmon Leyden, RN Registered Nurse Signed CASE MANAGEMENT Care Management Note Service date: 05/01/2013 10:29 AM CM spoke with patient and caregiver [name] at the bedside concerning dc planning. Pt receives private duty care services from [employer] in [place]. Pt disposition to home. MD order for PT eval. Pt provided choice list for [facility]. Awaiting PT eval to further assess dc needs.

## 2013-05-02 NOTE — Progress Notes (Signed)
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  Pt progressing well.   Follow Up Recommendations  No PT follow up     Does the patient have the potential to tolerate intense rehabilitation     Barriers to Discharge        Equipment Recommendations  None recommended by PT    Recommendations for Other Services    Frequency     Progress towards PT Goals Progress towards PT goals: Progressing toward goals  Plan      Precautions / Restrictions Precautions Precautions: Fall Restrictions Weight Bearing Restrictions: No    Pertinent Vitals/Pain No c/o pain    Mobility  Bed Mobility Bed Mobility: Supine to Sit Supine to Sit: 6: Modified independent (Device/Increase time) Details for Bed Mobility Assistance: increased time Transfers Transfers: Sit to Stand;Stand to Sit Sit to Stand: 6: Modified independent (Device/Increase time);5: Supervision;From toilet;From bed Stand to Sit: 6: Modified independent (Device/Increase time);5: Supervision;To toilet;To chair/3-in-1 Details for Transfer Assistance: good use of hands and safety cognition Ambulation/Gait Ambulation/Gait Assistance: 5: Supervision;6: Modified independent (Device/Increase time) Ambulation Distance (Feet): 320 Feet Assistive device: Rolling walker Ambulation/Gait Assistance Details: Pt admits she does not use any AD in the home but does admit she tends to steady self on furniture and walls.  States a walker would slow her down.  Did amb with a RW in the hallway due to wide space. 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

## 2013-05-02 NOTE — Progress Notes (Addendum)
[facility] PROGRESS NOTE   [name] ZOX:[id number] DOB: 1941/02/18 DOA: 04/30/2013 PCP: Rudi Heap, MD  Brief narrative: [name] is an 72 y.o. female with a PMH of amyloidosis treated with Velcade and dexamethasone in the past with partial response, complicated by intractable diarrhea and bowel perforation 2011, status post colostomy, under the care of Dr. Bertis Ruddy, who was admitted on 04/30/2013 with a syncopal event related to hypoglycemia. Upon initial evaluation in the emergency department, her creatinine was elevated over her usual baseline values and she was found to have evidence of a UTI.  Assessment/Plan: Principal Problem:   UTI (lower urinary tract infection) The patient was admitted and put on empiric Rocephin. Preliminary cultures are positive for Escherichia coli. Followup final culture results and narrow antibiotics once sensitivities available. Active Problems:   Amyloidosis Continue Reglan for but motility, pancreatic enzyme replacements, and Protonix. Under the care of Dr. Bertis Ruddy who recommends continued observation.   HYPERLIPIDEMIA-MIXED Continue Lipitor.      HYPERTENSION, UNSPECIFIED Continue metoprolol. Blood pressure mostly control with systolic pressures in the 130s..   Acute kidney injury secondary to dehydration Resolved with IV fluids.   Hypoglycemia   Anemia of chronic disease Hemoglobin dropping with IV fluids, likely dilutional. Continue folic acid.   Leukopenia WBC stable.    Thrombocytopenia, unspecified Lovenox discontinued. No signs of bleeding.   Protein-calorie malnutrition, severe Continue Megace.   Syncope secondary to hypoglycemia Resolved. Hypoglycemia episode may have been related to octreotide, which can cause hypoglycemia and 25% of cases.   Neuropathy Continue Elavil, Neurontin.   Code Status: Full. Family Communication: None at bedside. Disposition Plan: Home tomorrow.   IV access:  Peripheral  IV.  Medical Consultants:  None.  Other Consultants:  Physical therapy  Anti-infectives:  Rocephin 04/30/2013--->  HPI/Subjective: [name] complains of neuropathic pain to her lower extremities. She currently denies abdominal pain. She does suffer with chronic diarrhea. She has a colostomy in place.  Objective: Filed Vitals:   05/01/13 0520 05/01/13 1350 05/01/13 2130 05/02/13 0400  BP: 139/70 136/78 174/76 133/71  Pulse: 85 83 65 60  Temp: 97.9 F (36.6 C) 98.2 F (36.8 C) 98.8 F (37.1 C) 97.9 F (36.6 C)  TempSrc: Oral Oral Oral Oral  Resp: 16 20 18 12   Height:      Weight:      SpO2: 100% 100% 100% 99%    Intake/Output Summary (Last 24 hours) at 05/02/13 1047 Last data filed at 05/02/13 0600  Gross per 24 hour  Intake    240 ml  Output   2052 ml  Net  -1812 ml    Exam: Gen:  NAD Cardiovascular:  RRR, No M/R/G Respiratory:  Lungs CTAB Gastrointestinal:  Abdomen soft, colostomy right lower quadrant with loose stools, brown, reducible hernia left lower quadrant Extremities:  No C/E/C  Data Reviewed: asic Metabolic Panel:  Recent Labs Lab 04/30/13 1830 05/01/13 0347 05/02/13 0340  NA 141 142 138  K 4.1 4.1 3.5  CL 109 111 106  CO2 22 22 23   GLUCOSE 46* 109* 119*  BUN 18 13 9   CREATININE 1.53* 1.23* 0.91  CALCIUM 9.7 8.8 8.0*   GFR Estimated Creatinine Clearance: 40.9 ml/min (by C-G formula based on Cr of 0.91). Liver Function Tests:  Recent Labs Lab 04/30/13 1830  AST 32  ALT 23  ALKPHOS 82  BILITOT 0.4  PROT 7.0  ALBUMIN 4.2   Coagulation profile  Recent Labs Lab 04/30/13 1830  INR 1.16  CBC:  Recent Labs Lab 04/30/13 1830 05/01/13 0347 05/02/13 0340  WBC 4.0 3.8* 3.5*  NEUTROABS 2.7  --   --   HGB 9.5* 8.5* 8.5*  HCT 28.8* 25.9* 24.9*  MCV 90.9 90.6 89.9  PLT 142* 130* 118*   CBG:  Recent Labs Lab 05/01/13 0605 05/01/13 1141 05/01/13 1837 05/02/13 0115 05/02/13 0623  GLUCAP 104* 107* 94 116* 91    Microbiology Recent Results (from the past 240 hour(s))  URINE CULTURE     Status: None   Collection Time    04/30/13 10:24 PM      Result Value Range Status   Specimen Description URINE, CLEAN CATCH   Final   Special Requests NONE   Final   Culture  Setup Time     Final   Value: 05/01/2013 04:59     Performed at [facility] Count     Final   Value: >=100,000 COLONIES/ML     Performed at [facility]   Culture     Final   Value: ESCHERICHIA COLI     Performed at [facility]   Report Status PENDING   Incomplete     Procedures and Diagnostic Studies: No results found.  Scheduled Meds: . amitriptyline  25 mg Oral QHS  . aspirin EC  81 mg Oral Daily  . atorvastatin  40 mg Oral q1800  . cefTRIAXone (ROCEPHIN)  IV  1 g Intravenous Q24H  . diphenoxylate-atropine  1 tablet Oral QID  . feeding supplement (RESOURCE BREEZE)  1 Container Oral BID BM  . folic acid  1 mg Oral Daily  . gabapentin  300 mg Oral TID  . lipase/protease/amylase  1 capsule Oral TID  . metoCLOPramide  10 mg Oral BID  . metoprolol succinate  100 mg Oral Daily  . pantoprazole  40 mg Oral BID AC   Continuous Infusions: . sodium chloride 125 mL/hr at 04/30/13 1930    Time spent: 25 minutes.   LOS: 2 days   Siddhartha Hoback  [facility] Pager [phone].   *Please note that the hospitalists switch teams on Wednesdays. Please call the flow manager at [phone] if you are having difficulty reaching the hospitalist taking care of this patient as she can update you and provide the most up-to-date pager number of provider caring for the patient. If 8PM-8AM, please contact night-coverage at [email or web address], password [facility]  05/02/2013, 10:47 AM

## 2013-05-03 LAB — GLUCOSE, CAPILLARY
Glucose-Capillary: 106 mg/dL — ABNORMAL HIGH (ref 70–99)
Glucose-Capillary: 122 mg/dL — ABNORMAL HIGH (ref 70–99)
Glucose-Capillary: 99 mg/dL (ref 70–99)

## 2013-05-03 MED ORDER — CEFUROXIME AXETIL 500 MG PO TABS: 500.0000 mg | ORAL_TABLET | Freq: Two times a day (BID) | ORAL | Status: DC

## 2013-05-03 NOTE — Progress Notes (Signed)
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  Pt plans to d/C today.  Follow Up Recommendations  No PT follow up     Does the patient have the potential to tolerate intense rehabilitation     Barriers to Discharge        Equipment Recommendations       Recommendations for Other Services    Frequency     Progress towards PT Goals Progress towards PT goals: Progressing toward goals  Plan      Precautions / Restrictions Precautions Precautions: Fall Restrictions Weight Bearing Restrictions: No    Pertinent Vitals/Pain No c/o pain    Mobility  Bed Mobility Bed Mobility: Supine to Sit Supine to Sit: 6: Modified independent (Device/Increase time) Details for Bed Mobility Assistance: increased time Transfers Transfers: Sit to Stand;Stand to Sit Sit to Stand: 6: Modified independent (Device/Increase time);5: Supervision;From toilet;From bed Stand to Sit: 6: Modified independent (Device/Increase time);5: Supervision;To toilet;To chair/3-in-1 Details for Transfer Assistance: good use of hands and safety cognition Ambulation/Gait Ambulation/Gait Assistance: 5: Supervision;6: Modified independent (Device/Increase time) Ambulation Distance (Feet): 275 Feet Assistive device: Rolling walker Ambulation/Gait Assistance Details: good alternating gait Gait Pattern: Step-through pattern Gait velocity: WFL Stairs: Yes Stairs Assistance: 6: Modified independent (Device/Increase time);5: Supervision Stairs Assistance Details  (indicate cue type and reason): alternating when up steps then one step coming down holding to 2 rails Stair Management Technique: Two rails Number of Stairs: 7    PT Goals (current goals can now be found in the care plan section)    Visit Information  Last PT Received On: 05/03/13 Assistance Needed: +1 History of Present Illness: pt admitted 04/30/13 with near syncope episode and pt found to by hypglycemic in ED  Pt with extensive medical history including CKD, colostomy, HTN and autonomic orthostatis in the past    Subjective Data      Cognition       Balance     End of Session PT - End of Session Equipment Utilized During Treatment: Gait belt Activity Tolerance: Patient tolerated treatment well Patient left: in chair;with nursing/sitter in room   [employer]  PTA [facility]  Acute  Rehab Pager      [phone]

## 2013-05-03 NOTE — Discharge Summary (Signed)
Physician Discharge Summary  [name] JXB:[id number] DOB: 11-Aug-1940 DOA: 04/30/2013  PCP: Rudi Heap, MD  Admit date: 04/30/2013 Discharge date: 05/03/2013  Recommendations for Outpatient Follow-up:  1. F/U with PCP in 1 week.    Discharge Diagnoses:  Principal Problem:   E. Coli UTI (lower urinary tract infection) Active Problems:    HYPERLIPIDEMIA-MIXED    HYPERTENSION, UNSPECIFIED    Amyloidosis    Acute kidney injury secondary to dehydration    Hypoglycemia    Anemia of chronic disease    Leukopenia    Thrombocytopenia, unspecified    Protein-calorie malnutrition, severe    Syncope secondary to hypoglycemia    Neuropathy   Discharge Condition: Improved.  Diet recommendation: Regular.  History of present illness:  [name] is an 72 y.o. female with a PMH of amyloidosis treated with Velcade and dexamethasone in the past with partial response, complicated by intractable diarrhea and bowel perforation 2011, status post colostomy, under the care of Dr. Bertis Ruddy, who was admitted on 04/30/2013 with a syncopal event related to hypoglycemia. Upon initial evaluation in the emergency department, her creatinine was elevated over her usual baseline values and she was found to have evidence of a UTI.  Hospital Course by problem:  Principal Problem:  Escherichia coli UTI (lower urinary tract infection)  The patient was admitted and put on empiric Rocephin. Cultures were positive for Escherichia coli, Rocephin sensitive. The patient will be discharged on an additional 3 days of therapy with Ceftin for a total treatment course of 7 days.  Active Problems:  Amyloidosis  Continue Reglan for gut motility, pancreatic enzyme replacements, and Protonix. Under the care of Dr. Bertis Ruddy who recommends continued observation.  HYPERLIPIDEMIA-MIXED  Continue Lipitor.  HYPERTENSION, UNSPECIFIED  Continue metoprolol. Blood pressure mostly control with systolic pressures  in the 130s..  Acute kidney injury secondary to dehydration  Resolved with IV fluids.  Anemia of chronic disease  Hemoglobin initially dropped with IV fluids, likely dilutional. Continue folic acid. Hemoglobin stable after initial drop. Leukopenia  WBC stable.  Thrombocytopenia, unspecified  Lovenox discontinued. No signs of bleeding.  Protein-calorie malnutrition, severe  Continue Megace.  Syncope secondary to hypoglycemia  Resolved. Hypoglycemia episode may have been related to octreotide, which can cause hypoglycemia and 25% of cases.  Neuropathy  Continue Elavil, Neurontin.  Procedures:  None.  Consultations:  None.  Discharge Exam: Filed Vitals:   05/03/13 0546  BP: 136/66  Pulse: 64  Temp: 98.4 F (36.9 C)  Resp: 18   Filed Vitals:   05/02/13 0400 05/02/13 1400 05/02/13 2127 05/03/13 0546  BP: 133/71 145/80 138/85 136/66  Pulse: 60 71 71 64  Temp: 97.9 F (36.6 C) 98.5 F (36.9 C) 97.7 F (36.5 C) 98.4 F (36.9 C)  TempSrc: Oral Oral Oral Oral  Resp: 12 16 18 18   Height:      Weight:      SpO2: 99% 100% 99% 97%    Gen: NAD  Cardiovascular: RRR, No M/R/G  Respiratory: Lungs CTAB  Gastrointestinal: Abdomen soft, colostomy right lower quadrant with loose stools, brown, reducible hernia left lower quadrant  Extremities: No C/E/C  Discharge Instructions  Discharge Orders   Future Appointments Provider Department Dept Phone   05/08/2013 11:15 AM Mauri Brooklyn [facility] [phone]   05/08/2013 11:45 AM Artis Delay, MD [facility] [phone]   05/08/2013 1:00 PM [facility] [place] [facility] [phone]   06/28/2013  9:30 AM Len Blalock, NP [facility] [phone]   08/15/2013 10:00 AM [facility] [place] [facility] [address] [phone]   08/15/2013 11:00 AM Carma Lair Nickel, NP [facility]  -[facility] [phone]   Future Orders Complete By Expires   Call MD for:  extreme fatigue  As directed    Call MD for:  persistant nausea and vomiting  As directed    Call MD for:  severe uncontrolled pain  As directed    Call MD for:  temperature >100.4  As directed    Diet general  As directed    Discharge instructions  As directed    Comments:     You were cared for by Dr. Hillery Aldo  (a hospitalist) during your hospital stay. If you have any questions about your discharge medications or the care you received while you were in the hospital after you are discharged, you can call the unit and ask to speak with the hospitalist on call if the hospitalist that took care of you is not available. Once you are discharged, your primary care physician will handle any further medical issues. Please note that NO REFILLS for any discharge medications will be authorized once you are discharged, as it is imperative that you return to your primary care physician (or establish a relationship with a primary care physician if you do not have one) for your aftercare needs so that they can reassess your need for medications and monitor your lab values.  Any outstanding tests can be reviewed by your PCP at your follow up visit.  It is also important to review any medicine changes with your PCP.  Please bring these d/c instructions with you to your next visit so your physician can review these changes with you.  If you do not have a primary care physician, you can call [phone] for a physician referral.  It is highly recommended that you obtain a PCP for hospital follow up.   Increase activity slowly  As directed        Medication List         AMBIEN 10 MG tablet  Generic drug:  zolpidem  Take 5 mg by mouth at bedtime as needed. For sleep     amitriptyline 25 MG tablet  Commonly known as:  ELAVIL  Take 25 mg by mouth at bedtime.     aspirin EC 81 MG tablet  Take 1 tablet (81 mg total) by mouth daily.      atorvastatin 40 MG tablet  Commonly known as:  LIPITOR  Take 40 mg by mouth daily.     Calcium Carbonate-Vit D-Min 1200-1000 MG-UNIT Chew  Chew 1 tablet by mouth 2 (two) times daily.     cefUROXime 500 MG tablet  Commonly known as:  CEFTIN  Take 1 tablet (500 mg total) by mouth 2 (two) times daily with a meal.     diphenoxylate-atropine 2.5-0.025 MG per tablet  Commonly known as:  LOMOTIL  TAKE ONE TABLET BY MOUTH EVERY 6 HOURS AS NEEDED FOR DIARRHEA     folic acid 1 MG tablet  Commonly known as:  FOLVITE  Take 1 mg by mouth daily.     gabapentin 300 MG capsule  Commonly known as:  NEURONTIN  Take 1 capsule (300 mg total) by mouth 3 (three) times daily.     lipase/protease/amylase 29528 UNITS Cpep capsule  Commonly known as:  CREON-12/PANCREASE  Take 1 capsule by mouth  3 (three) times daily.     loperamide 2 MG capsule  Commonly known as:  IMODIUM  Take 2 mg by mouth 4 (four) times daily as needed. For diarrhea     megestrol 40 MG/ML suspension  Commonly known as:  MEGACE  Take 200 mg by mouth 2 (two) times daily as needed. For appetite control     metoCLOPramide 10 MG tablet  Commonly known as:  REGLAN  Take 10 mg by mouth 2 (two) times daily.     metoprolol succinate 100 MG 24 hr tablet  Commonly known as:  TOPROL-XL  Take 1 tablet (100 mg total) by mouth daily. Take with or immediately following a meal.     multivitamin tablet  Take 1 tablet by mouth daily.     nystatin-triamcinolone ointment  Commonly known as:  MYCOLOG  Apply topically 2 (two) times daily.     octreotide 30 MG injection  Commonly known as:  SANDOSTATIN LAR  Inject 30 mg into the muscle every 28 (twenty-eight) days. Octreotide for secretory diarrhea     ondansetron 8 MG tablet  Commonly known as:  ZOFRAN  Take 8 mg by mouth every 8 (eight) hours as needed. For nausea     oxyCODONE-acetaminophen 5-325 MG per tablet  Commonly known as:  PERCOCET/ROXICET  Take 1 tablet by mouth every  6 (six) hours as needed for moderate pain or severe pain. Take as directed     pantoprazole 40 MG tablet  Commonly known as:  PROTONIX  Take 1 tablet (40 mg total) by mouth 2 (two) times daily before a meal.           Follow-up Information   Follow up with Rudi Heap, MD. Schedule an appointment as soon as possible for a visit in 1 week. [facility] follow up.)    Specialty:  Family Medicine   Contact information:   [address] [place] [address] [phone]        The results of significant diagnostics from this hospitalization (including imaging, microbiology, ancillary and laboratory) are listed below for reference.    Significant Diagnostic Studies: No results found.  Labs:  Basic Metabolic Panel:  Recent Labs Lab 04/30/13 1830 05/01/13 0347 05/02/13 0340  NA 141 142 138  K 4.1 4.1 3.5  CL 109 111 106  CO2 22 22 23   GLUCOSE 46* 109* 119*  BUN 18 13 9   CREATININE 1.53* 1.23* 0.91  CALCIUM 9.7 8.8 8.0*   GFR Estimated Creatinine Clearance: 40.9 ml/min (by C-G formula based on Cr of 0.91). Liver Function Tests:  Recent Labs Lab 04/30/13 1830  AST 32  ALT 23  ALKPHOS 82  BILITOT 0.4  PROT 7.0  ALBUMIN 4.2   Coagulation profile  Recent Labs Lab 04/30/13 1830  INR 1.16    CBC:  Recent Labs Lab 04/30/13 1830 05/01/13 0347 05/02/13 0340  WBC 4.0 3.8* 3.5*  NEUTROABS 2.7  --   --   HGB 9.5* 8.5* 8.5*  HCT 28.8* 25.9* 24.9*  MCV 90.9 90.6 89.9  PLT 142* 130* 118*   CBG:  Recent Labs Lab 05/02/13 0623 05/02/13 1340 05/02/13 1759 05/03/13 0002 05/03/13 0633  GLUCAP 91 105* 104* 122* 99   Microbiology Recent Results (from the past 240 hour(s))  URINE CULTURE     Status: None   Collection Time    04/30/13 10:24 PM      Result Value Range Status   Specimen Description URINE, CLEAN CATCH   Final  Special Requests NONE   Final   Culture  Setup Time     Final   Value: 05/01/2013 04:59     Performed at [facility] Count     Final   Value: >=100,000 COLONIES/ML     Performed at [facility]   Culture     Final   Value: ESCHERICHIA COLI     Performed at [facility]   Report Status 05/02/2013 FINAL   Final   Organism ID, Bacteria ESCHERICHIA COLI   Final    Time coordinating discharge: 35 minutes.  Signed:  Monque Haggar  Pager [phone] [facility] 05/03/2013, 10:33 AM

## 2013-05-03 NOTE — Progress Notes (Signed)
Patient was stable at time of discharge. Reviewed discharge education with patient. She verbalized understanding and had no further questions. IV was removed.

## 2013-05-08 ENCOUNTER — Ambulatory Visit (HOSPITAL_BASED_OUTPATIENT_CLINIC_OR_DEPARTMENT_OTHER): Payer: Medicare Other | Admitting: Hematology and Oncology

## 2013-05-08 ENCOUNTER — Ambulatory Visit: Payer: Medicare Other

## 2013-05-08 ENCOUNTER — Other Ambulatory Visit (HOSPITAL_BASED_OUTPATIENT_CLINIC_OR_DEPARTMENT_OTHER): Payer: Medicare Other | Admitting: Lab

## 2013-05-08 ENCOUNTER — Ambulatory Visit (HOSPITAL_BASED_OUTPATIENT_CLINIC_OR_DEPARTMENT_OTHER): Payer: Medicare Other

## 2013-05-08 ENCOUNTER — Telehealth: Payer: Self-pay | Admitting: Hematology and Oncology

## 2013-05-08 VITALS — BP 116/63 | HR 74 | Temp 98.0°F | Resp 18 | Ht 63.0 in | Wt 102.6 lb

## 2013-05-08 DIAGNOSIS — E859 Amyloidosis, unspecified: Secondary | ICD-10-CM

## 2013-05-08 DIAGNOSIS — G609 Hereditary and idiopathic neuropathy, unspecified: Secondary | ICD-10-CM

## 2013-05-08 DIAGNOSIS — D649 Anemia, unspecified: Secondary | ICD-10-CM

## 2013-05-08 DIAGNOSIS — R197 Diarrhea, unspecified: Secondary | ICD-10-CM

## 2013-05-08 DIAGNOSIS — K529 Noninfective gastroenteritis and colitis, unspecified: Secondary | ICD-10-CM

## 2013-05-08 DIAGNOSIS — D72819 Decreased white blood cell count, unspecified: Secondary | ICD-10-CM

## 2013-05-08 DIAGNOSIS — D696 Thrombocytopenia, unspecified: Secondary | ICD-10-CM

## 2013-05-08 DIAGNOSIS — M549 Dorsalgia, unspecified: Secondary | ICD-10-CM

## 2013-05-08 LAB — CBC WITH DIFFERENTIAL/PLATELET
BASO%: 0.7 % (ref 0.0–2.0)
Basophils Absolute: 0 10*3/uL (ref 0.0–0.1)
Eosinophils Absolute: 0.2 10*3/uL (ref 0.0–0.5)
HCT: 27.2 % — ABNORMAL LOW (ref 34.8–46.6)
HGB: 8.8 g/dL — ABNORMAL LOW (ref 11.6–15.9)
LYMPH%: 28.2 % (ref 14.0–49.7)
MCHC: 32.2 g/dL (ref 31.5–36.0)
MONO#: 0.3 10*3/uL (ref 0.1–0.9)
NEUT#: 1.8 10*3/uL (ref 1.5–6.5)
NEUT%: 56.4 % (ref 38.4–76.8)
RDW: 13.7 % (ref 11.2–14.5)
WBC: 3.2 10*3/uL — ABNORMAL LOW (ref 3.9–10.3)
lymph#: 0.9 10*3/uL (ref 0.9–3.3)

## 2013-05-08 LAB — COMPREHENSIVE METABOLIC PANEL (CC13)
ALT: 21 U/L (ref 0–55)
Albumin: 4 g/dL (ref 3.5–5.0)
Anion Gap: 8 mEq/L (ref 3–11)
BUN: 11.5 mg/dL (ref 7.0–26.0)
CO2: 25 mEq/L (ref 22–29)
Calcium: 8.9 mg/dL (ref 8.4–10.4)
Chloride: 109 mEq/L (ref 98–109)
Creatinine: 1 mg/dL (ref 0.6–1.1)
Potassium: 4.6 mEq/L (ref 3.5–5.1)
Total Bilirubin: 0.54 mg/dL (ref 0.20–1.20)

## 2013-05-08 LAB — LACTATE DEHYDROGENASE (CC13): LDH: 251 U/L — ABNORMAL HIGH (ref 125–245)

## 2013-05-08 MED ORDER — OXYCODONE-ACETAMINOPHEN 5-325 MG PO TABS: 1.0000 | ORAL_TABLET | Freq: Four times a day (QID) | ORAL | Status: DC | PRN

## 2013-05-08 MED ORDER — OCTREOTIDE ACETATE 30 MG IM KIT
30.0000 mg | PACK | Freq: Once | INTRAMUSCULAR | Status: AC
  Administered 2013-05-08: 30 mg via INTRAMUSCULAR
  Filled 2013-05-08: qty 1

## 2013-05-08 NOTE — Telephone Encounter (Signed)
Gave pt appt for injections and MD on december 2014

## 2013-05-08 NOTE — Progress Notes (Signed)
[facility] OFFICE PROGRESS NOTE  Patient Care Team: Ernestina Penna, MD as PCP - General Lynett Fish, MD (Hematology and Oncology) Laurey Morale, MD (Cardiology) Malissa Hippo, MD (Gastroenterology)  DIAGNOSIS: Systemic amyloidosis  SUMMARY OF ONCOLOGIC HISTORY: According to the patient, show chronic anemia for a long time and developed syncopal episode. 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 She had evaluation and second opinion at [facility] and was deemed not a candidate for bone marrow transplant. She had been treated with Velcade and dexamethasone in the past with some partial response. The patient developed intractable diarrhea with bowel perforation in 2011 and since then she has been placed on observation.  INTERVAL HISTORY: [name] 72 y.o. female returns for further workup. She was admitted to the hospital recently due to dehydration and urinary tract infection. She still has chronic diarrhea. Denies any recent bleeding in the stool. She continued to have chronic back pain and takes pain medication regularly. She of very mild peripheral neuropathy effecting the tips of fingers and toes. Denies any dysuria, frequency or urgency. She denies any recent fever, chills, night sweats or abnormal weight loss  I have reviewed the past medical history, past surgical history, social history and family history with the patient and they are unchanged from previous note.  ALLERGIES:  has No Known Allergies.  MEDICATIONS:  Current Outpatient Prescriptions  Medication Sig Dispense Refill  . amitriptyline (ELAVIL) 25 MG tablet Take 25 mg by mouth at bedtime.       Marland Kitchen aspirin EC 81 MG tablet Take 1 tablet (81 mg total) by mouth daily.      Marland Kitchen atorvastatin (LIPITOR) 40 MG tablet Take 40 mg by mouth daily.      . Calcium Carbonate-Vit D-Min 1200-1000 MG-UNIT CHEW Chew 1 tablet by mouth 2 (two) times daily.      . cefUROXime  (CEFTIN) 500 MG tablet Take 1 tablet (500 mg total) by mouth 2 (two) times daily with a meal.  8 tablet  0  . diphenoxylate-atropine (LOMOTIL) 2.5-0.025 MG per tablet TAKE ONE TABLET BY MOUTH EVERY 6 HOURS AS NEEDED FOR DIARRHEA  120 tablet  2  . folic acid (FOLVITE) 1 MG tablet Take 1 mg by mouth daily.        Marland Kitchen gabapentin (NEURONTIN) 300 MG capsule Take 1 capsule (300 mg total) by mouth 3 (three) times daily.  90 capsule  6  . lipase/protease/amylase (CREON-10/PANCREASE) 12000 UNITS CPEP Take 1 capsule by mouth 3 (three) times daily.       Marland Kitchen loperamide (IMODIUM) 2 MG capsule Take 2 mg by mouth 4 (four) times daily as needed. For diarrhea       . megestrol (MEGACE) 40 MG/ML suspension Take 200 mg by mouth 2 (two) times daily as needed. For appetite control      . metoCLOPramide (REGLAN) 10 MG tablet Take 10 mg by mouth 2 (two) times daily.       . metoprolol succinate (TOPROL-XL) 100 MG 24 hr tablet Take 1 tablet (100 mg total) by mouth daily. Take with or immediately following a meal.  30 tablet  6  . Multiple Vitamin (MULTIVITAMIN) tablet Take 1 tablet by mouth daily.        Marland Kitchen nystatin-triamcinolone ointment (MYCOLOG) Apply topically 2 (two) times daily.  30 g  0  . octreotide (SANDOSTATIN LAR) 30 MG injection Inject 30 mg into the muscle every 28 (twenty-eight) days. Octreotide  for secretory diarrhea      . ondansetron (ZOFRAN) 8 MG tablet Take 8 mg by mouth every 8 (eight) hours as needed. For nausea      . oxyCODONE-acetaminophen (PERCOCET/ROXICET) 5-325 MG per tablet Take 1 tablet by mouth every 6 (six) hours as needed for moderate pain or severe pain. Take as directed  60 tablet  0  . pantoprazole (PROTONIX) 40 MG tablet Take 1 tablet (40 mg total) by mouth 2 (two) times daily before a meal.  60 tablet  2  . zolpidem (AMBIEN) 10 MG tablet Take 5 mg by mouth at bedtime as needed. For sleep       No current facility-administered medications for this visit.    REVIEW OF SYSTEMS:    Constitutional: Denies fevers, chills or abnormal weight loss Eyes: Denies blurriness of vision Ears, nose, mouth, throat, and face: Denies mucositis or sore throat Respiratory: Denies cough, dyspnea or wheezes Cardiovascular: Denies palpitation, chest discomfort or lower extremity swelling Skin: Denies abnormal skin rashes Lymphatics: Denies new lymphadenopathy or easy bruising Neurological:Denies numbness, tingling or new weaknesses Behavioral/Psych: Mood is stable, no new changes  All other systems were reviewed with the patient and are negative.  PHYSICAL EXAMINATION: ECOG PERFORMANCE STATUS: 1 - Symptomatic but completely ambulatory  Filed Vitals:   05/08/13 1123  BP: 116/63  Pulse: 74  Temp: 98 F (36.7 C)  Resp: 18   Filed Weights   05/08/13 1123  Weight: 102 lb 9.6 oz (46.539 kg)    GENERAL:alert, no distress and comfortable. She looks thin and mildly cachectic SKIN: skin color, texture, turgor are normal, no rashes or significant lesions EYES: normal, Conjunctiva are pink and non-injected, sclera clear OROPHARYNX:no exudate, no erythema and lips, buccal mucosa, and tongue normal  NECK: supple, thyroid normal size, non-tender, without nodularity LYMPH:  no palpable lymphadenopathy in the cervical, axillary or inguinal LUNGS: clear to auscultation and percussion with normal breathing effort HEART: regular rate & rhythm and no murmurs and no lower extremity edema ABDOMEN:abdomen soft, non-tender and normal bowel sounds Musculoskeletal:no cyanosis of digits and no clubbing  NEURO: alert & oriented x 3 with fluent speech, no focal motor/sensory deficits  LABORATORY DATA:  I have reviewed the data as listed    Component Value Date/Time   NA 142 05/08/2013 1110   NA 138 05/02/2013 0340   NA 141 04/17/2013 1552   K 4.6 05/08/2013 1110   K 3.5 05/02/2013 0340   CL 106 05/02/2013 0340   CL 106 09/18/2012 1041   CO2 25 05/08/2013 1110   CO2 23 05/02/2013 0340    GLUCOSE 88 05/08/2013 1110   GLUCOSE 119* 05/02/2013 0340   GLUCOSE 94 04/17/2013 1552   GLUCOSE 107* 09/18/2012 1041   BUN 11.5 05/08/2013 1110   BUN 9 05/02/2013 0340   BUN 15 04/17/2013 1552   CREATININE 1.0 05/08/2013 1110   CREATININE 0.91 05/02/2013 0340   CALCIUM 8.9 05/08/2013 1110   CALCIUM 8.0* 05/02/2013 0340   PROT 6.9 05/08/2013 1110   PROT 7.0 04/30/2013 1830   PROT 6.7 04/17/2013 1552   ALBUMIN 4.0 05/08/2013 1110   ALBUMIN 4.2 04/30/2013 1830   AST 27 05/08/2013 1110   AST 32 04/30/2013 1830   ALT 21 05/08/2013 1110   ALT 23 04/30/2013 1830   ALKPHOS 78 05/08/2013 1110   ALKPHOS 82 04/30/2013 1830   BILITOT 0.54 05/08/2013 1110   BILITOT 0.4 04/30/2013 1830   GFRNONAA 62* 05/02/2013 0340   GFRAA 72*  05/02/2013 0340    No results found for this basename: SPEP,  UPEP,   kappa and lambda light chains    Lab Results  Component Value Date   WBC 3.2* 05/08/2013   NEUTROABS 1.8 05/08/2013   HGB 8.8* 05/08/2013   HCT 27.2* 05/08/2013   MCV 91.0 05/08/2013   PLT 125* 05/08/2013      Chemistry      Component Value Date/Time   NA 142 05/08/2013 1110   NA 138 05/02/2013 0340   NA 141 04/17/2013 1552   K 4.6 05/08/2013 1110   K 3.5 05/02/2013 0340   CL 106 05/02/2013 0340   CL 106 09/18/2012 1041   CO2 25 05/08/2013 1110   CO2 23 05/02/2013 0340   BUN 11.5 05/08/2013 1110   BUN 9 05/02/2013 0340   BUN 15 04/17/2013 1552   CREATININE 1.0 05/08/2013 1110   CREATININE 0.91 05/02/2013 0340   GLU 97 09/01/2010      Component Value Date/Time   CALCIUM 8.9 05/08/2013 1110   CALCIUM 8.0* 05/02/2013 0340   ALKPHOS 78 05/08/2013 1110   ALKPHOS 82 04/30/2013 1830   AST 27 05/08/2013 1110   AST 32 04/30/2013 1830   ALT 21 05/08/2013 1110   ALT 23 04/30/2013 1830   BILITOT 0.54 05/08/2013 1110   BILITOT 0.4 04/30/2013 1830     ASSESSMENT & PLAN:  #1 systemic amyloidosis The patient had very poor performance status and currently placed on observation only. I will  not treat the patient due to poor comorbidities. She will continue to see me on a monthly basis. #2 chronic diarrhea This could be due to amyloidosis. She will continue on monthly octreotide injection #3 anemia This is likely anemia of chronic disease. The patient denies recent history of bleeding such as epistaxis, hematuria or hematochezia. She is asymptomatic from the anemia. We will observe for now.  She does not require transfusion now.  #4 leukopenia This is likely due to possible bone marrow involvement. The patient denies recent history of fevers, cough, chills, diarrhea or dysuria. She is asymptomatic from the leukopenia. I will observe for now.  #5 thrombocytopenia The cause is likely due to bone marrow involvement. It is mild and there is little change compared from previous platelet count. The patient denies recent history of bleeding such as epistaxis, hematuria or hematochezia. She is asymptomatic from the thrombocytopenia. I will observe for now.  she does not require transfusion now.  #6 peripheral neuropathy This could be related to prior treatment. We will continue on Neurontin #7 chronic back pain I gave her a prescription of pain medicine to take. #8 recent UTI She is completing her last dose of antibiotics today. She has no further symptoms of urinary frequency, urgency or dysuria.  Orders Placed This Encounter  Procedures  . Comprehensive metabolic panel    Standing Status: Future     Number of Occurrences:      Standing Expiration Date: 05/08/2014  . CBC with Differential    Standing Status: Future     Number of Occurrences:      Standing Expiration Date: 01/28/2014  . Beta 2 microglobuline, serum    Standing Status: Future     Number of Occurrences:      Standing Expiration Date: 05/08/2014  . SPEP & IFE with QIG    Standing Status: Future     Number of Occurrences:      Standing Expiration Date: 05/08/2014  . Kappa/lambda light chains  Standing Status:  Future     Number of Occurrences:      Standing Expiration Date: 05/08/2014   All questions were answered. The patient knows to call the clinic with any problems, questions or concerns. No barriers to learning was detected.   Brogan Martis, MD 05/08/2013 1:20 PM

## 2013-05-11 LAB — SPEP & IFE WITH QIG
Albumin ELP: 61.7 % (ref 55.8–66.1)
Alpha-1-Globulin: 6.5 % — ABNORMAL HIGH (ref 2.9–4.9)
Alpha-2-Globulin: 10 % (ref 7.1–11.8)
Beta 2: 4.4 % (ref 3.2–6.5)
Beta Globulin: 5.8 % (ref 4.7–7.2)
IgA: 125 mg/dL (ref 69–380)

## 2013-05-11 LAB — KAPPA/LAMBDA LIGHT CHAINS: Kappa:Lambda Ratio: 0.11 — ABNORMAL LOW (ref 0.26–1.65)

## 2013-05-14 ENCOUNTER — Ambulatory Visit: Payer: Medicare Other | Admitting: Hematology and Oncology

## 2013-06-06 ENCOUNTER — Encounter: Payer: Self-pay | Admitting: Hematology and Oncology

## 2013-06-06 ENCOUNTER — Ambulatory Visit (HOSPITAL_BASED_OUTPATIENT_CLINIC_OR_DEPARTMENT_OTHER): Payer: Medicare Other | Admitting: Hematology and Oncology

## 2013-06-06 ENCOUNTER — Ambulatory Visit (HOSPITAL_BASED_OUTPATIENT_CLINIC_OR_DEPARTMENT_OTHER): Payer: Medicare Other

## 2013-06-06 ENCOUNTER — Telehealth: Payer: Self-pay | Admitting: Hematology and Oncology

## 2013-06-06 VITALS — BP 123/68 | HR 80 | Temp 97.7°F | Resp 17 | Ht 63.0 in | Wt 96.4 lb

## 2013-06-06 DIAGNOSIS — G40909 Epilepsy, unspecified, not intractable, without status epilepticus: Secondary | ICD-10-CM

## 2013-06-06 DIAGNOSIS — D72819 Decreased white blood cell count, unspecified: Secondary | ICD-10-CM

## 2013-06-06 DIAGNOSIS — E859 Amyloidosis, unspecified: Secondary | ICD-10-CM

## 2013-06-06 DIAGNOSIS — M549 Dorsalgia, unspecified: Secondary | ICD-10-CM

## 2013-06-06 DIAGNOSIS — K529 Noninfective gastroenteritis and colitis, unspecified: Secondary | ICD-10-CM

## 2013-06-06 DIAGNOSIS — R29818 Other symptoms and signs involving the nervous system: Secondary | ICD-10-CM

## 2013-06-06 DIAGNOSIS — R197 Diarrhea, unspecified: Secondary | ICD-10-CM

## 2013-06-06 DIAGNOSIS — D649 Anemia, unspecified: Secondary | ICD-10-CM

## 2013-06-06 DIAGNOSIS — D696 Thrombocytopenia, unspecified: Secondary | ICD-10-CM

## 2013-06-06 DIAGNOSIS — G609 Hereditary and idiopathic neuropathy, unspecified: Secondary | ICD-10-CM

## 2013-06-06 HISTORY — DX: Epilepsy, unspecified, not intractable, without status epilepticus: G40.909

## 2013-06-06 MED ORDER — MORPHINE SULFATE 15 MG PO TABS: 15.0000 mg | ORAL_TABLET | Freq: Four times a day (QID) | ORAL | Status: DC | PRN

## 2013-06-06 MED ORDER — OCTREOTIDE ACETATE 30 MG IM KIT
30.0000 mg | PACK | Freq: Once | INTRAMUSCULAR | Status: AC
Start: 2013-06-06 — End: 2013-06-06
  Administered 2013-06-06: 30 mg via INTRAMUSCULAR
  Filled 2013-06-06: qty 1

## 2013-06-06 NOTE — Progress Notes (Signed)
[facility] OFFICE PROGRESS NOTE  Patient Care Team: Ernestina Penna, MD as PCP - General Lynett Fish, MD (Hematology and Oncology) Laurey Morale, MD (Cardiology) Malissa Hippo, MD (Gastroenterology)  DIAGNOSIS: History of amyloidosis, significant debility  SUMMARY OF ONCOLOGIC HISTORY: According to the patient, show chronic anemia for a long time and developed syncopal episode. 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 She had evaluation and second opinion at [facility] and was deemed not a candidate for bone marrow transplant. She had been treated with Velcade and dexamethasone in the past with some partial response. The patient developed intractable diarrhea with bowel perforation in 2011 and since then she has been placed on observation. In November 2014, she was admitted to the hospital due to urinary tract infection INTERVAL HISTORY: [name] 72 y.o. female returns for further followup. The patient complained of persistent weakness. Recently she was seen in another facility for possible stroke and subsequently found to have rotator cuff injury. She is very weak and currently in rehabilitation unit. She have severe pain all over her body. She felt that the current oxycodone was prescribed to her is not helping. She is to have ongoing diarrhea and also regular nausea. The patient denies any recent signs or symptoms of bleeding such as spontaneous epistaxis, hematuria or hematochezia.  I have reviewed the past medical history, past surgical history, social history and family history with the patient and they are unchanged from previous note.  ALLERGIES:  has No Known Allergies.  MEDICATIONS:  Current Outpatient Prescriptions  Medication Sig Dispense Refill  . amitriptyline (ELAVIL) 25 MG tablet Take 25 mg by mouth at bedtime.       Marland Kitchen aspirin EC 81 MG tablet Take 1 tablet (81 mg total) by mouth daily.      Marland Kitchen  atorvastatin (LIPITOR) 40 MG tablet Take 40 mg by mouth daily.      . Calcium Carbonate-Vit D-Min 1200-1000 MG-UNIT CHEW Chew 1 tablet by mouth 2 (two) times daily.      . diphenoxylate-atropine (LOMOTIL) 2.5-0.025 MG per tablet TAKE ONE TABLET BY MOUTH EVERY 6 HOURS AS NEEDED FOR DIARRHEA  120 tablet  2  . folic acid (FOLVITE) 1 MG tablet Take 1 mg by mouth daily.        Marland Kitchen gabapentin (NEURONTIN) 300 MG capsule Take 1 capsule (300 mg total) by mouth 3 (three) times daily.  90 capsule  6  . lipase/protease/amylase (CREON-10/PANCREASE) 12000 UNITS CPEP Take 1 capsule by mouth 3 (three) times daily.       Marland Kitchen loperamide (IMODIUM) 2 MG capsule Take 2 mg by mouth 4 (four) times daily as needed. For diarrhea       . megestrol (MEGACE) 40 MG/ML suspension Take 200 mg by mouth 2 (two) times daily as needed. For appetite control      . metoCLOPramide (REGLAN) 10 MG tablet Take 10 mg by mouth 2 (two) times daily.       . metoprolol succinate (TOPROL-XL) 100 MG 24 hr tablet Take 1 tablet (100 mg total) by mouth daily. Take with or immediately following a meal.  30 tablet  6  . Multiple Vitamin (MULTIVITAMIN) tablet Take 1 tablet by mouth daily.        Marland Kitchen nystatin-triamcinolone ointment (MYCOLOG) Apply topically 2 (two) times daily.  30 g  0  . octreotide (SANDOSTATIN LAR) 30 MG injection Inject 30 mg into the muscle every 28 (twenty-eight) days.  Octreotide for secretory diarrhea      . ondansetron (ZOFRAN) 8 MG tablet Take 8 mg by mouth every 8 (eight) hours as needed. For nausea      . oxyCODONE-acetaminophen (PERCOCET/ROXICET) 5-325 MG per tablet Take 1 tablet by mouth every 6 (six) hours as needed for moderate pain or severe pain. Take as directed  60 tablet  0  . pantoprazole (PROTONIX) 40 MG tablet Take 1 tablet (40 mg total) by mouth 2 (two) times daily before a meal.  60 tablet  2  . zolpidem (AMBIEN) 10 MG tablet Take 5 mg by mouth at bedtime as needed. For sleep      . morphine (MSIR) 15 MG tablet Take 1  tablet (15 mg total) by mouth every 6 (six) hours as needed for moderate pain or severe pain.  90 tablet  0   No current facility-administered medications for this visit.    REVIEW OF SYSTEMS:   Constitutional: Denies fevers, chills she has lost some weight Eyes: Denies blurriness of vision Ears, nose, mouth, throat, and face: Denies mucositis or sore throat Respiratory: Denies cough, dyspnea or wheezes Cardiovascular: Denies palpitation, chest discomfort or lower extremity swelling Gastrointestinal:  Denies nausea, heartburn or change in bowel habits Skin: Denies abnormal skin rashes Lymphatics: Denies new lymphadenopathy or easy bruising Behavioral/Psych: Mood is stable, no new changes  All other systems were reviewed with the patient and are negative.  PHYSICAL EXAMINATION: ECOG PERFORMANCE STATUS: 2 - Symptomatic, <50% confined to bed  Filed Vitals:   06/06/13 1029  BP: 123/68  Pulse: 80  Temp: 97.7 F (36.5 C)  Resp: 17   Filed Weights   06/06/13 1029  Weight: 96 lb 6.4 oz (43.727 kg)    GENERAL:alert, no distress and comfortable. She looks thin and cachectic SKIN: skin color, texture, turgor are normal, no rashes or significant lesions EYES: normal, Conjunctiva are pink and non-injected, sclera clear OROPHARYNX:no exudate, no erythema and lips, buccal mucosa, and tongue normal  NECK: supple, thyroid normal size, non-tender, without nodularity LYMPH:  no palpable lymphadenopathy in the cervical, axillary or inguinal LUNGS: clear to auscultation and percussion with normal breathing effort HEART: regular rate & rhythm and no murmurs and no lower extremity edema ABDOMEN:abdomen soft, non-tender and normal bowel sounds Musculoskeletal:no cyanosis of digits and no clubbing  NEURO: alert & oriented x 3 with fluent speech, noted weakness on her right arm  LABORATORY DATA:  I have reviewed the data as listed    Component Value Date/Time   NA 142 05/08/2013 1110   NA 138  05/02/2013 0340   NA 141 04/17/2013 1552   K 4.6 05/08/2013 1110   K 3.5 05/02/2013 0340   CL 106 05/02/2013 0340   CL 106 09/18/2012 1041   CO2 25 05/08/2013 1110   CO2 23 05/02/2013 0340   GLUCOSE 88 05/08/2013 1110   GLUCOSE 119* 05/02/2013 0340   GLUCOSE 94 04/17/2013 1552   GLUCOSE 107* 09/18/2012 1041   BUN 11.5 05/08/2013 1110   BUN 9 05/02/2013 0340   BUN 15 04/17/2013 1552   CREATININE 1.0 05/08/2013 1110   CREATININE 0.91 05/02/2013 0340   CALCIUM 8.9 05/08/2013 1110   CALCIUM 8.0* 05/02/2013 0340   PROT 6.9 05/08/2013 1110   PROT 7.0 04/30/2013 1830   PROT 6.7 04/17/2013 1552   ALBUMIN 4.0 05/08/2013 1110   ALBUMIN 4.2 04/30/2013 1830   AST 27 05/08/2013 1110   AST 32 04/30/2013 1830   ALT 21 05/08/2013 1110  ALT 23 04/30/2013 1830   ALKPHOS 78 05/08/2013 1110   ALKPHOS 82 04/30/2013 1830   BILITOT 0.54 05/08/2013 1110   BILITOT 0.4 04/30/2013 1830   GFRNONAA 62* 05/02/2013 0340   GFRAA 72* 05/02/2013 0340    No results found for this basename: SPEP,  UPEP,   kappa and lambda light chains    Lab Results  Component Value Date   WBC 3.2* 05/08/2013   NEUTROABS 1.8 05/08/2013   HGB 8.8* 05/08/2013   HCT 27.2* 05/08/2013   MCV 91.0 05/08/2013   PLT 125* 05/08/2013      Chemistry      Component Value Date/Time   NA 142 05/08/2013 1110   NA 138 05/02/2013 0340   NA 141 04/17/2013 1552   K 4.6 05/08/2013 1110   K 3.5 05/02/2013 0340   CL 106 05/02/2013 0340   CL 106 09/18/2012 1041   CO2 25 05/08/2013 1110   CO2 23 05/02/2013 0340   BUN 11.5 05/08/2013 1110   BUN 9 05/02/2013 0340   BUN 15 04/17/2013 1552   CREATININE 1.0 05/08/2013 1110   CREATININE 0.91 05/02/2013 0340   GLU 97 09/01/2010      Component Value Date/Time   CALCIUM 8.9 05/08/2013 1110   CALCIUM 8.0* 05/02/2013 0340   ALKPHOS 78 05/08/2013 1110   ALKPHOS 82 04/30/2013 1830   AST 27 05/08/2013 1110   AST 32 04/30/2013 1830   ALT 21 05/08/2013 1110   ALT 23 04/30/2013 1830   BILITOT  0.54 05/08/2013 1110   BILITOT 0.4 04/30/2013 1830     ASSESSMENT & PLAN:  #1 systemic amyloidosis The patient had very poor performance status and currently placed on observation only. I will not treat the patient due to poor comorbidities. She will continue to see me on a monthly basis. Her most recent light chain measurements were stable #2 chronic diarrhea This could be due to amyloidosis. She will continue on monthly octreotide injection and Imodium #3 anemia This is likely anemia of chronic disease. The patient denies recent history of bleeding such as epistaxis, hematuria or hematochezia. She is asymptomatic from the anemia. We will observe for now.  She does not require transfusion now.  #4 leukopenia This is likely due to possible bone marrow involvement. The patient denies recent history of fevers, cough, chills, diarrhea or dysuria. She is asymptomatic from the leukopenia. I will observe for now.  #5 thrombocytopenia The cause is likely due to bone marrow involvement. It is mild and there is little change compared from previous platelet count. The patient denies recent history of bleeding such as epistaxis, hematuria or hematochezia. She is asymptomatic from the thrombocytopenia. I will observe for now.  she does not require transfusion now.  #6 peripheral neuropathy This could be related to prior treatment. We will continue on Neurontin #7 chronic back pain I gave her a prescription of pain medicine to take. I increase the pain medicine to morphine every 6 hours as needed, 15 mg immediate release #8 recent UTI She is completing her last dose of antibiotics today. She has no further symptoms of urinary frequency, urgency or dysuria. #9 neurological weakness and possible stroke/seizure Apparently she had imaging study done elsewhere which rule out stroke. The patient is too weak. She requested neurology consultation. I will refer her to see a neurologist.  Orders Placed This  Encounter  Procedures  . Ambulatory referral to Neurology    Referral Priority:  Routine    Referral Type:  Consultation    Referral Reason:  Specialty Services Required    Requested Specialty:  Neurology    Number of Visits Requested:  1   All questions were answered. The patient knows to call the clinic with any problems, questions or concerns. No barriers to learning was detected.    Cindie Rajagopalan, MD 06/06/2013 11:27 AM

## 2013-06-06 NOTE — Telephone Encounter (Signed)
gv and printed appt sched and avs for pt for Jan 2015...gv referral to [facility]

## 2013-06-28 ENCOUNTER — Encounter (INDEPENDENT_AMBULATORY_CARE_PROVIDER_SITE_OTHER): Payer: Self-pay | Admitting: Internal Medicine

## 2013-06-28 ENCOUNTER — Ambulatory Visit (INDEPENDENT_AMBULATORY_CARE_PROVIDER_SITE_OTHER): Payer: Medicare Other | Admitting: Internal Medicine

## 2013-06-28 VITALS — BP 90/54 | HR 64 | Temp 97.6°F | Ht 63.0 in | Wt 90.4 lb

## 2013-06-28 DIAGNOSIS — K529 Noninfective gastroenteritis and colitis, unspecified: Secondary | ICD-10-CM

## 2013-06-28 DIAGNOSIS — R1310 Dysphagia, unspecified: Secondary | ICD-10-CM | POA: Insufficient documentation

## 2013-06-28 DIAGNOSIS — R197 Diarrhea, unspecified: Secondary | ICD-10-CM

## 2013-06-28 NOTE — Progress Notes (Signed)
Subjective:     Patient ID: [name], female   DOB: 1941-04-24, 73 y.o.   MRN: [id number]  HPI Here today for f/u of her dysphagia. She was last seen in July of 2014. She is a resident of [facility]. She is there for rehab. Apparently she was in [place] in November and she was seen at a hospital. Thought she may have had a stroke.  She had weakness in her rt arm.  Found to have a rt rotator cuff injury. She tells me her appetite is not good. She has lost about 15 pounds since her last visit in July. She empties her ileostomy bag 4 times a day. No melena or bright red blood.  She is seen at the Oncology Clinic for her amyloidosis and receives octerotide injection monthly. She denies any dysphagia. Hx of chronic anemia and is followed by Oncology Clinic an [facility].   HPI Colonoscopy 2010: Dr Laural Golden: pancolonic diverticulosis. Negative rectal biopsy for amyloidosis and focal colitis sigmoid colon which systologically was non-specific and felt to not to be source of diarrhea.  12/19/2012 EGD/ED: Dysphagia: Impression:  Distal esophageal stricture.  Esophagus dilated by passing 48 and 52 [place] dilators.  Food debris in the stomach with patent pylorus secondary to known gastroparesis.  Abnormal appearance to gastric mucosa. Biopsy taken to rule out gastric amyloidosis.  Biopsy negative for amyloidosis.  CBC    Component Value Date/Time   WBC 3.2* 05/08/2013 1110   WBC 3.5* 05/02/2013 0340   WBC 3.5 04/17/2013 1552   RBC 2.99* 05/08/2013 1110   RBC 2.77* 05/02/2013 0340   RBC 3.45* 04/17/2013 1552   HGB 8.8* 05/08/2013 1110   HGB 8.5* 05/02/2013 0340   HCT 27.2* 05/08/2013 1110   HCT 24.9* 05/02/2013 0340   PLT 125* 05/08/2013 1110   PLT 118* 05/02/2013 0340   MCV 91.0 05/08/2013 1110   MCV 89.9 05/02/2013 0340   MCH 29.3 05/08/2013 1110   MCH 30.7 05/02/2013 0340   MCH 29.6 04/17/2013 1552   MCHC 32.2 05/08/2013 1110   MCHC 34.1 05/02/2013 0340   MCHC  32.3 04/17/2013 1552   RDW 13.7 05/08/2013 1110   RDW 13.0 05/02/2013 0340   RDW 14.2 04/17/2013 1552   LYMPHSABS 0.9 05/08/2013 1110   LYMPHSABS 0.8 04/30/2013 1830   LYMPHSABS 1.0 04/17/2013 1552   MONOABS 0.3 05/08/2013 1110   MONOABS 0.5 04/30/2013 1830   EOSABS 0.2 05/08/2013 1110   EOSABS 0.1 04/30/2013 1830   BASOSABS 0.0 05/08/2013 1110   BASOSABS 0.0 04/30/2013 1830   BASOSABS 0.0 04/17/2013 1552       Review of Systems Past Medical History  Diagnosis Date  . History of CEA (carotid endarterectomy) 03/2007    Left CEA by Dr. Gae Gallop  . Hypertension   . Hyperlipidemia   . GERD (gastroesophageal reflux disease)   . Anemia in chronic kidney disease(285.21) 02/2009    She has been on Aranesp for ? anemia of chronic kidney disease though renal function does not seem severely impaired.  She had repeat bone marrow biopsy by Dr. Sonny Dandy 9/10.  Marland Kitchen Episode of syncope     dysauthonomia/ orthostatic syncope - Patient has Medtronic loop recorder implanted 8/10.  No events so far - May be due only to orthostasis  . 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    . History of MRI 01/2009    CardiacMRI/EF 66%, mild to  moderate concentric LVH, RV size & systolic function normAL w/o evidence for ARVC, trivial pericardial effusion/On delayed enhancement, it was difficult to obtain the TI/There was patchy mid-wall basal delayed enhancement/There was partially circumferential subendocardialdelayed enhancement in the mid to apical LV/This was suggestive of infiltrative cardiomyopathy versus  . History of CT scan of chest 01/2009    to assess for sarcoidosis: no evidence by CT for pulmonary sarcoid  . 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   . Secretory diarrhea      treated with octreotide.   . Esophageal stricture 05/2010    with dilation  .  Amyloidosis   . Shortness of breath     when tired  . Cancer 2010    Cancer of the Blood stream  . Chronic back pain 04/10/2013  . Seizure disorder 06/06/2013   Past Surgical History  Procedure Laterality Date  . Implantable loop recorder  2010    MDT for unexplained syncope  . Partial colectomy  1/11    for diverticular perforation  . Carotid endarterectomy  10/08    left  . Abdominal hysterectomy    . Upper gastrointestinal endoscopy  05/20/2010    EGD ED  . Colonoscopy  03/14/2009  . Ercp  04/09/2011    Procedure: ENDOSCOPIC RETROGRADE CHOLANGIOPANCREATOGRAPHY (ERCP);  Surgeon: Rogene Houston, MD;  Location: [facility] ORS;  Service: Endoscopy;  Laterality: N/A;  10:25  . Sphincterotomy  04/09/2011    Procedure: SPHINCTEROTOMY;  Surgeon: Rogene Houston, MD;  Location: [facility] ORS;  Service: Endoscopy;  Laterality: N/A;  with removal of multiple pigment stones  . Loop recorder explant  Feb. 18, 2014    removed 3 months ago. at [facility]  . Esophagogastroduodenoscopy (egd) with esophageal dilation N/A 12/19/2012    Procedure: ESOPHAGOGASTRODUODENOSCOPY (EGD) WITH ESOPHAGEAL DILATION;  Surgeon: Rogene Houston, MD;  Location: [facility] ENDO SUITE;  Service: Endoscopy;  Laterality: N/A;  1030   No Known Allergies     Objective:   Physical Exam There were no vitals filed for this visit. Filed Vitals:   06/28/13 0934  BP: 90/54  Pulse: 64  Temp: 97.6 F (36.4 C)  Height: $Remove'5\' 3"'EnOQdmK$  (1.6 m)  Weight: 90 lb 6.4 oz (41.005 kg)  Alert and oriented. Skin warm and dry. Oral mucosa is moist.   . Sclera anicteric, conjunctivae is pink. Thyroid not enlarged. No cervical lymphadenopathy. Lungs clear. Heart regular rate and rhythm.  Abdomen is soft. Bowel sounds are positive. No hepatomegaly. No abdominal masses felt. No tenderness. 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 Dressing in place. . D . Presently taking IM antibiotics for this.  No edema to lower extremities.   Very weak today  and is walking with a cane.       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 She empties her ileostomy bag about 4 times a day.    Dysphagia: No dysphagia at this time.  Plan:    Continue Osteride IM monthly. Dysphagia. No problems swallowing at this time.  Megace $Remov'200mg'JljfMm$  po twice a day.  OV in 3 months with Dr. Laural Golden.

## 2013-06-28 NOTE — Patient Instructions (Addendum)
OV in 6 months with Dr. Rehman 

## 2013-07-03 ENCOUNTER — Other Ambulatory Visit: Payer: Self-pay | Admitting: Hematology and Oncology

## 2013-07-04 ENCOUNTER — Ambulatory Visit (HOSPITAL_BASED_OUTPATIENT_CLINIC_OR_DEPARTMENT_OTHER): Payer: Medicare Other

## 2013-07-04 ENCOUNTER — Encounter: Payer: Self-pay | Admitting: Hematology and Oncology

## 2013-07-04 ENCOUNTER — Ambulatory Visit (HOSPITAL_BASED_OUTPATIENT_CLINIC_OR_DEPARTMENT_OTHER): Payer: Medicare Other | Admitting: Hematology and Oncology

## 2013-07-04 ENCOUNTER — Other Ambulatory Visit (HOSPITAL_BASED_OUTPATIENT_CLINIC_OR_DEPARTMENT_OTHER): Payer: Medicare Other

## 2013-07-04 VITALS — BP 94/47 | HR 61 | Temp 97.9°F | Resp 17 | Ht 63.0 in | Wt 91.6 lb

## 2013-07-04 DIAGNOSIS — E859 Amyloidosis, unspecified: Secondary | ICD-10-CM

## 2013-07-04 DIAGNOSIS — R634 Abnormal weight loss: Secondary | ICD-10-CM

## 2013-07-04 DIAGNOSIS — D649 Anemia, unspecified: Secondary | ICD-10-CM

## 2013-07-04 DIAGNOSIS — K529 Noninfective gastroenteritis and colitis, unspecified: Secondary | ICD-10-CM

## 2013-07-04 DIAGNOSIS — M898X9 Other specified disorders of bone, unspecified site: Secondary | ICD-10-CM

## 2013-07-04 DIAGNOSIS — R197 Diarrhea, unspecified: Secondary | ICD-10-CM

## 2013-07-04 HISTORY — DX: Other specified disorders of bone, unspecified site: M89.8X9

## 2013-07-04 LAB — COMPREHENSIVE METABOLIC PANEL (CC13)
ALBUMIN: 3.8 g/dL (ref 3.5–5.0)
ALT: 28 U/L (ref 0–55)
ANION GAP: 10 meq/L (ref 3–11)
AST: 35 U/L — ABNORMAL HIGH (ref 5–34)
Alkaline Phosphatase: 113 U/L (ref 40–150)
BILIRUBIN TOTAL: 0.65 mg/dL (ref 0.20–1.20)
BUN: 20.7 mg/dL (ref 7.0–26.0)
CO2: 21 meq/L — AB (ref 22–29)
Calcium: 10.3 mg/dL (ref 8.4–10.4)
Chloride: 108 mEq/L (ref 98–109)
Creatinine: 1.2 mg/dL — ABNORMAL HIGH (ref 0.6–1.1)
GLUCOSE: 106 mg/dL (ref 70–140)
Potassium: 4.9 mEq/L (ref 3.5–5.1)
SODIUM: 140 meq/L (ref 136–145)
TOTAL PROTEIN: 7.1 g/dL (ref 6.4–8.3)

## 2013-07-04 LAB — CBC WITH DIFFERENTIAL/PLATELET
BASO%: 0.4 % (ref 0.0–2.0)
Basophils Absolute: 0 10*3/uL (ref 0.0–0.1)
EOS%: 1.8 % (ref 0.0–7.0)
Eosinophils Absolute: 0.1 10*3/uL (ref 0.0–0.5)
HEMATOCRIT: 30.1 % — AB (ref 34.8–46.6)
HGB: 9.8 g/dL — ABNORMAL LOW (ref 11.6–15.9)
LYMPH#: 1.1 10*3/uL (ref 0.9–3.3)
LYMPH%: 22.1 % (ref 14.0–49.7)
MCH: 29.1 pg (ref 25.1–34.0)
MCHC: 32.6 g/dL (ref 31.5–36.0)
MCV: 89.3 fL (ref 79.5–101.0)
MONO#: 0.4 10*3/uL (ref 0.1–0.9)
MONO%: 8.1 % (ref 0.0–14.0)
NEUT%: 67.6 % (ref 38.4–76.8)
NEUTROS ABS: 3.4 10*3/uL (ref 1.5–6.5)
Platelets: 223 10*3/uL (ref 145–400)
RBC: 3.37 10*6/uL — AB (ref 3.70–5.45)
RDW: 12.7 % (ref 11.2–14.5)
WBC: 5.1 10*3/uL (ref 3.9–10.3)

## 2013-07-04 MED ORDER — MORPHINE SULFATE 15 MG PO TABS: 15.0000 mg | ORAL_TABLET | Freq: Four times a day (QID) | ORAL | Status: DC | PRN

## 2013-07-04 MED ORDER — OCTREOTIDE ACETATE 30 MG IM KIT
30.0000 mg | PACK | Freq: Once | INTRAMUSCULAR | Status: AC
  Administered 2013-07-04: 30 mg via INTRAMUSCULAR
  Filled 2013-07-04: qty 1

## 2013-07-04 NOTE — Progress Notes (Signed)
[place] OFFICE PROGRESS NOTE  Patient Care Team: Chipper Herb, MD as PCP - General Deeann Saint, MD (Hematology and Oncology) Larey Dresser, MD (Cardiology) Rogene Houston, MD (Gastroenterology)  DIAGNOSIS: Systemic amyloidosis with chronic diarrhea  SUMMARY OF ONCOLOGIC HISTORY: According to the patient, she had chronic anemia for a long time and developed syncopal episode. 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 She had evaluation and second opinion at [facility] and was deemed not a candidate for bone marrow transplant. She had been treated with Velcade and dexamethasone in the past with some partial response. The patient developed intractable diarrhea with bowel perforation in 2011 and since then she has been placed on observation and had been receiving octreotide In November 2014, she was admitted to the hospital due to urinary tract infection  INTERVAL HISTORY: [name] 73 y.o. female returns for further followup. She continued to have progressive weight loss and severe diarrhea The patient was seen by GI service recently. She had some biopsy done for repeat EGD which was negative for amyloidosis She has her poor appetite and was prescribed Megace. The patient has very poor energy She has diffuse muscle pain throughout The morphine I prescribed is helping with some pain  I have reviewed the past medical history, past surgical history, social history and family history with the patient and they are unchanged from previous note.  ALLERGIES:  has No Known Allergies.  MEDICATIONS:  Current Outpatient Prescriptions  Medication Sig Dispense Refill  . amitriptyline (ELAVIL) 25 MG tablet Take 25 mg by mouth at bedtime.       Marland Kitchen aspirin EC 81 MG tablet Take 1 tablet (81 mg total) by mouth daily.      Marland Kitchen atorvastatin (LIPITOR) 40 MG tablet Take 40 mg by mouth daily.      . Calcium Carbonate-Vit D-Min 1200-1000  MG-UNIT CHEW Chew 1 tablet by mouth 2 (two) times daily.      . cholecalciferol (VITAMIN D) 400 UNITS TABS tablet Take 1,000 Units by mouth.      . diphenoxylate-atropine (LOMOTIL) 2.5-0.025 MG per tablet TAKE ONE TABLET BY MOUTH EVERY 6 HOURS AS NEEDED FOR DIARRHEA  161 tablet  2  . folic acid (FOLVITE) 1 MG tablet Take 1 mg by mouth daily.        Marland Kitchen gabapentin (NEURONTIN) 300 MG capsule Take 1 capsule (300 mg total) by mouth 3 (three) times daily.  90 capsule  6  . lipase/protease/amylase (CREON-10/PANCREASE) 12000 UNITS CPEP Take 1 capsule by mouth 3 (three) times daily.       Marland Kitchen loperamide (IMODIUM) 2 MG capsule Take 2 mg by mouth 4 (four) times daily as needed. For diarrhea       . megestrol (MEGACE) 40 MG/ML suspension Take 200 mg by mouth 2 (two) times daily as needed. For appetite control      . metoCLOPramide (REGLAN) 10 MG tablet Take 10 mg by mouth 2 (two) times daily.       . metoprolol succinate (TOPROL-XL) 100 MG 24 hr tablet Take 1 tablet (100 mg total) by mouth daily. Take with or immediately following a meal.  30 tablet  6  . morphine (MSIR) 15 MG tablet Take 1 tablet (15 mg total) by mouth every 6 (six) hours as needed for moderate pain or severe pain.  90 tablet  0  . Multiple Vitamin (MULTIVITAMIN) tablet Take 1 tablet by mouth daily.        Marland Kitchen  nystatin-triamcinolone ointment (MYCOLOG) Apply topically 2 (two) times daily.  30 g  0  . octreotide (SANDOSTATIN LAR) 30 MG injection Inject 30 mg into the muscle every 28 (twenty-eight) days. Octreotide for secretory diarrhea      . ondansetron (ZOFRAN) 8 MG tablet Take 8 mg by mouth every 8 (eight) hours as needed. For nausea      . oxyCODONE-acetaminophen (PERCOCET/ROXICET) 5-325 MG per tablet Take 1 tablet by mouth every 6 (six) hours as needed for moderate pain or severe pain. Take as directed  60 tablet  0  . pantoprazole (PROTONIX) 40 MG tablet Take 40 mg by mouth daily.      Marland Kitchen zolpidem (AMBIEN) 10 MG tablet Take 5 mg by mouth at  bedtime as needed. For sleep       No current facility-administered medications for this visit.    REVIEW OF SYSTEMS:   Constitutional: Denies fevers, chills or abnormal weight loss Eyes: Denies blurriness of vision Ears, nose, mouth, throat, and face: Denies mucositis or sore throat Respiratory: Denies cough, dyspnea or wheezes Cardiovascular: Denies palpitation, chest discomfort or lower extremity swelling Skin: Denies abnormal skin rashes Lymphatics: Denies new lymphadenopathy or easy bruising Neurological:Denies numbness, tingling or new weaknesses Behavioral/Psych: Mood is stable, no new changes  All other systems were reviewed with the patient and are negative.  PHYSICAL EXAMINATION: ECOG PERFORMANCE STATUS: 2 - Symptomatic, <50% confined to bed  Filed Vitals:   07/04/13 1208  BP: 94/47  Pulse: 61  Temp: 97.9 F (36.6 C)  Resp: 17   Filed Weights   07/04/13 1208  Weight: 91 lb 9.6 oz (41.549 kg)    GENERAL:alert, no distress and comfortableShe looks thin and cachectic.  SKIN: skin color, texture, turgor are normal, no rashes or significant lesions EYES: normal, Conjunctiva are pink and non-injected, sclera clear OROPHARYNX:no exudate, no erythema and lips, buccal mucosa, and tongue normal  NECK: supple, thyroid normal size, non-tender, without nodularity LYMPH:  no palpable lymphadenopathy in the cervical, axillary or inguinal LUNGS: clear to auscultation and percussion with normal breathing effort HEART: regular rate & rhythm and no murmurs and no lower extremity edema ABDOMEN:abdomen soft, non-tender and normal bowel sounds. Her stoma site looks okay  Musculoskeletal:no cyanosis of digits and no clubbing  NEURO: alert & oriented x 3 with fluent speech, no focal motor/sensory deficits  LABORATORY DATA:  I have reviewed the data as listed    Component Value Date/Time   NA 140 07/04/2013 1153   NA 138 05/02/2013 0340   NA 141 04/17/2013 1552   K 4.9 07/04/2013  1153   K 3.5 05/02/2013 0340   CL 106 05/02/2013 0340   CL 106 09/18/2012 1041   CO2 21* 07/04/2013 1153   CO2 23 05/02/2013 0340   GLUCOSE 106 07/04/2013 1153   GLUCOSE 119* 05/02/2013 0340   GLUCOSE 94 04/17/2013 1552   GLUCOSE 107* 09/18/2012 1041   BUN 20.7 07/04/2013 1153   BUN 9 05/02/2013 0340   BUN 15 04/17/2013 1552   CREATININE 1.2* 07/04/2013 1153   CREATININE 0.91 05/02/2013 0340   CALCIUM 10.3 07/04/2013 1153   CALCIUM 8.0* 05/02/2013 0340   PROT 7.1 07/04/2013 1153   PROT 7.0 04/30/2013 1830   PROT 6.7 04/17/2013 1552   ALBUMIN 3.8 07/04/2013 1153   ALBUMIN 4.2 04/30/2013 1830   AST 35* 07/04/2013 1153   AST 32 04/30/2013 1830   ALT 28 07/04/2013 1153   ALT 23 04/30/2013 1830   ALKPHOS 113 07/04/2013  1153   ALKPHOS 82 04/30/2013 1830   BILITOT 0.65 07/04/2013 1153   BILITOT 0.4 04/30/2013 1830   GFRNONAA 62* 05/02/2013 0340   GFRAA 72* 05/02/2013 0340    No results found for this basename: SPEP,  UPEP,   kappa and lambda light chains    Lab Results  Component Value Date   WBC 5.1 07/04/2013   NEUTROABS 3.4 07/04/2013   HGB 9.8* 07/04/2013   HCT 30.1* 07/04/2013   MCV 89.3 07/04/2013   PLT 223 07/04/2013      Chemistry      Component Value Date/Time   NA 140 07/04/2013 1153   NA 138 05/02/2013 0340   NA 141 04/17/2013 1552   K 4.9 07/04/2013 1153   K 3.5 05/02/2013 0340   CL 106 05/02/2013 0340   CL 106 09/18/2012 1041   CO2 21* 07/04/2013 1153   CO2 23 05/02/2013 0340   BUN 20.7 07/04/2013 1153   BUN 9 05/02/2013 0340   BUN 15 04/17/2013 1552   CREATININE 1.2* 07/04/2013 1153   CREATININE 0.91 05/02/2013 0340   GLU 97 09/01/2010      Component Value Date/Time   CALCIUM 10.3 07/04/2013 1153   CALCIUM 8.0* 05/02/2013 0340   ALKPHOS 113 07/04/2013 1153   ALKPHOS 82 04/30/2013 1830   AST 35* 07/04/2013 1153   AST 32 04/30/2013 1830   ALT 28 07/04/2013 1153   ALT 23 04/30/2013 1830   BILITOT 0.65 07/04/2013 1153   BILITOT 0.4 04/30/2013 1830      ASSESSMENT & PLAN:   #1 systemic amyloidosis The patient had very poor performance status and currently placed on observation only. I will not treat the patient due to poor comorbidities.  I do not think a progressive decline is related to systemic amyloidosis. Her serum light chain is not very high #2 chronic diarrhea I am doubtful that this is due to amyloidosis. 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 She has seen GI service and it does not seem that any additional can be helpful I recommend second opinion at [facility] and she agreed #3 anemia This is likely anemia of chronic disease. The patient denies recent history of bleeding such as epistaxis, hematuria or hematochezia. She is asymptomatic from the anemia. We will observe for now.  She does not require transfusion now.  #4 chronic back pain I gave her a prescription of pain medicine to take. I increase the pain medicine to morphine every 6 hours as needed, 15 mg immediate release #5 progressive weight loss and general decline I suspect this is all related to her severe, ongoing chronic diarrhea It is not clear that this is due to systemic amyloidosis I recommend second opinion. If  the patient does not improve, I recommend hospice care. In a compassionate manner, I explained to the patient the rationale for hospice care and she agreed to consider that after her second opinion at [facility]. All questions were answered. The patient knows to call the clinic with any problems, questions or concerns. No barriers to learning was detected.    [facility], [place], MD 07/04/2013 7:23 PM

## 2013-07-04 NOTE — Patient Instructions (Signed)

## 2013-07-05 ENCOUNTER — Telehealth: Payer: Self-pay | Admitting: Hematology and Oncology

## 2013-07-05 ENCOUNTER — Telehealth: Payer: Self-pay | Admitting: *Deleted

## 2013-07-05 NOTE — Telephone Encounter (Signed)
Received 2 more Voice mails from pt's brother requesting to speak w/ Dr. Alvy Bimler.  Spoke w/ brother and assured him Dr. Alvy Bimler intends to call him back today.  She has not had time yet as she has a full clinic day of seeing patients.  He was appreciative of the call.   Received VM from Nurse at SNF requesting information on pt's condition to share with her family.  Called back and left her a VM asking for her fax number and I will fax over office note from yesterday.  Confirmed Dr. Alvy Bimler called and s/w pt's brother at aprox 3:45 pm today.

## 2013-07-05 NOTE — Telephone Encounter (Signed)
I spoke with the patient's brother as instructed by the patient I discussed with him poor prognosis due to progressive weight loss. I recommend second opinion at [facility]. Address all his questions and concerns

## 2013-07-05 NOTE — Telephone Encounter (Signed)
Pt's brother left VM.  States he would like information about what Dr. Alvy Bimler told pt yesterday. He says his sister was not really able to explain what she was told, just that she was upset.  He asks if he can speak w/ Dr. Alvy Bimler?

## 2013-07-06 ENCOUNTER — Telehealth: Payer: Self-pay | Admitting: *Deleted

## 2013-07-06 LAB — SPEP & IFE WITH QIG
Albumin ELP: 56.2 % (ref 55.8–66.1)
Alpha-1-Globulin: 7.4 % — ABNORMAL HIGH (ref 2.9–4.9)
Alpha-2-Globulin: 14.1 % — ABNORMAL HIGH (ref 7.1–11.8)
BETA 2: 5.1 % (ref 3.2–6.5)
Beta Globulin: 6 % (ref 4.7–7.2)
GAMMA GLOBULIN: 11.2 % (ref 11.1–18.8)
IGA: 149 mg/dL (ref 69–380)
IGM, SERUM: 42 mg/dL — AB (ref 52–322)
IgG (Immunoglobin G), Serum: 780 mg/dL (ref 690–1700)
M-SPIKE, %: 0.15 g/dL
Total Protein, Serum Electrophoresis: 6.6 g/dL (ref 6.0–8.3)

## 2013-07-06 LAB — KAPPA/LAMBDA LIGHT CHAINS
KAPPA FREE LGHT CHN: 1.66 mg/dL (ref 0.33–1.94)
KAPPA LAMBDA RATIO: 0.11 — AB (ref 0.26–1.65)
Lambda Free Lght Chn: 14.9 mg/dL — ABNORMAL HIGH (ref 0.57–2.63)

## 2013-07-06 LAB — BETA 2 MICROGLOBULIN, SERUM: BETA 2 MICROGLOBULIN: 3.95 mg/L — AB (ref 1.01–1.73)

## 2013-07-06 NOTE — Telephone Encounter (Signed)
Faxed last office visit notes to [facility]

## 2013-07-09 ENCOUNTER — Telehealth: Payer: Self-pay | Admitting: Hematology and Oncology

## 2013-07-09 ENCOUNTER — Telehealth: Payer: Self-pay | Admitting: *Deleted

## 2013-07-09 NOTE — Telephone Encounter (Signed)
Pt's brother left a voice mail wanting to know if appointment has been scheduled at [facility], wants to know about Hospice. States patient wants to go home, but there is none who can take care of her at home.

## 2013-07-09 NOTE — Telephone Encounter (Signed)
Pt appt. To see Dr. Alvie Heidelberg is 07/11/13@2 :55. Pt is aware

## 2013-07-12 ENCOUNTER — Other Ambulatory Visit: Payer: Self-pay | Admitting: Neurosurgery

## 2013-07-12 DIAGNOSIS — Z48812 Encounter for surgical aftercare following surgery on the circulatory system: Secondary | ICD-10-CM

## 2013-07-12 DIAGNOSIS — I6529 Occlusion and stenosis of unspecified carotid artery: Secondary | ICD-10-CM

## 2013-07-17 ENCOUNTER — Other Ambulatory Visit: Payer: Self-pay | Admitting: Hematology and Oncology

## 2013-07-17 ENCOUNTER — Telehealth: Payer: Self-pay | Admitting: *Deleted

## 2013-07-17 ENCOUNTER — Other Ambulatory Visit: Payer: Self-pay | Admitting: *Deleted

## 2013-07-17 NOTE — Telephone Encounter (Signed)
Informed pt of appt on Thurs 2/05 at 11:15 am.  He will bring pt.Marland Kitchen

## 2013-07-17 NOTE — Telephone Encounter (Signed)
Left Vm for brother requesting he call nurse back so we can discuss what Dr. Alvy Bimler said.

## 2013-07-17 NOTE — Telephone Encounter (Signed)
We can use the new patient slot that day at 115 to see her

## 2013-07-17 NOTE — Telephone Encounter (Signed)
Brother called states pt saw Dr. Alvie Heidelberg at [facility] and was offered option of chemotherapy or hospice.  He says he feels pt too weak to do chemo, but pt says she wants to try chemo.  He says he doesn't feel the "shots" are helping pt at all as "everything runs right through her" to where she is having several accidents a day.  He is concerned the rehab she is at [facility] ph [phone]) wants to d/c her home next week.  He asks if Dr. Alvie Heidelberg has called Dr. Alvy Bimler yet?  He is not sure what the plan should be at this point.  He feels pt would benefit from Hospice care but not sure pt will agree.

## 2013-07-17 NOTE — Telephone Encounter (Signed)
I was not contacted by dr. Samule Ohm I am not comfortable treating her with chemotherapy

## 2013-07-17 NOTE — Telephone Encounter (Signed)
S/w brother again and he says he agrees w/ Hospice referral but would feel better if pt can see Dr. Alvy Bimler again to discuss one more time.  He asks if pt can see Dr. Alvy Bimler this Thursday 2/05 as he will be back in town on that day.  He lives in [place].

## 2013-07-18 ENCOUNTER — Telehealth: Payer: Self-pay | Admitting: Hematology and Oncology

## 2013-07-18 NOTE — Telephone Encounter (Signed)
lvm for pt regarding to 2.5.15 appt.Marland KitchenMarland KitchenMarland Kitchen

## 2013-07-19 ENCOUNTER — Other Ambulatory Visit: Payer: Self-pay | Admitting: *Deleted

## 2013-07-19 ENCOUNTER — Encounter: Payer: Self-pay | Admitting: Hematology and Oncology

## 2013-07-19 ENCOUNTER — Telehealth: Payer: Self-pay | Admitting: *Deleted

## 2013-07-19 ENCOUNTER — Ambulatory Visit (HOSPITAL_BASED_OUTPATIENT_CLINIC_OR_DEPARTMENT_OTHER): Payer: Medicare Other | Admitting: Hematology and Oncology

## 2013-07-19 VITALS — BP 91/64 | HR 71 | Temp 97.2°F | Resp 18 | Ht 63.0 in | Wt 90.6 lb

## 2013-07-19 DIAGNOSIS — R197 Diarrhea, unspecified: Secondary | ICD-10-CM

## 2013-07-19 DIAGNOSIS — R55 Syncope and collapse: Secondary | ICD-10-CM

## 2013-07-19 DIAGNOSIS — R634 Abnormal weight loss: Secondary | ICD-10-CM

## 2013-07-19 DIAGNOSIS — R5381 Other malaise: Secondary | ICD-10-CM

## 2013-07-19 DIAGNOSIS — M549 Dorsalgia, unspecified: Secondary | ICD-10-CM

## 2013-07-19 DIAGNOSIS — E43 Unspecified severe protein-calorie malnutrition: Secondary | ICD-10-CM

## 2013-07-19 DIAGNOSIS — G8929 Other chronic pain: Secondary | ICD-10-CM

## 2013-07-19 DIAGNOSIS — D649 Anemia, unspecified: Secondary | ICD-10-CM

## 2013-07-19 DIAGNOSIS — M898X9 Other specified disorders of bone, unspecified site: Secondary | ICD-10-CM

## 2013-07-19 DIAGNOSIS — E859 Amyloidosis, unspecified: Secondary | ICD-10-CM

## 2013-07-19 DIAGNOSIS — E8589 Other amyloidosis: Secondary | ICD-10-CM

## 2013-07-19 NOTE — Progress Notes (Signed)
[place] OFFICE PROGRESS NOTE  Patient Care Team: Chipper Herb, MD as PCP - General Deeann Saint, MD (Hematology and Oncology) Larey Dresser, MD (Cardiology) Rogene Houston, MD (Gastroenterology) Jeanann Lewandowsky as Consulting Physician (Hematology and Oncology)  DIAGNOSIS: Systemic amyloidosis, chronic diarrhea  SUMMARY OF ONCOLOGIC HISTORY: According to the patient, she had chronic anemia for a long time and developed syncopal episode. 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 She had evaluation and second opinion at [facility] and was deemed not a candidate for bone marrow transplant. She had been treated with Velcade and dexamethasone in the past with some partial response. The patient developed intractable diarrhea with bowel perforation in 2011 and since then she has been placed on observation and had been receiving octreotide In November 2014, she was admitted to the hospital due to urinary tract infection In January 2015, due to progressive decline, we discontinue octreotide  INTERVAL HISTORY: [name] 73 y.o. female returns for further followup. She saw Dr. Alvie Heidelberg at [facility] for repeat evaluation. The patient has very poor performance status and had recurrent falls and severe diarrhea and profound weakness. She has lost more weight. The patient is feeling very miserable today  I have reviewed the past medical history, past surgical history, social history and family history with the patient and they are unchanged from previous note.  ALLERGIES:  has No Known Allergies.  MEDICATIONS:  Current Outpatient Prescriptions  Medication Sig Dispense Refill  . aspirin EC 81 MG tablet Take 1 tablet (81 mg total) by mouth daily.      Marland Kitchen atorvastatin (LIPITOR) 40 MG tablet Take 40 mg by mouth daily.      . Calcium Carbonate-Vit D-Min 1200-1000 MG-UNIT CHEW Chew 1 tablet by mouth 2 (two) times daily.       . cholecalciferol (VITAMIN D) 400 UNITS TABS tablet Take 1,000 Units by mouth.      . diphenoxylate-atropine (LOMOTIL) 2.5-0.025 MG per tablet TAKE ONE TABLET BY MOUTH EVERY 6 HOURS AS NEEDED FOR DIARRHEA  120 tablet  2  . gabapentin (NEURONTIN) 300 MG capsule Take 1 capsule (300 mg total) by mouth 3 (three) times daily.  90 capsule  6  . lipase/protease/amylase (CREON-10/PANCREASE) 12000 UNITS CPEP Take 1 capsule by mouth 3 (three) times daily.       . megestrol (MEGACE) 40 MG/ML suspension Take 200 mg by mouth 2 (two) times daily as needed. For appetite control      . metoprolol succinate (TOPROL-XL) 50 MG 24 hr tablet Take 50 mg by mouth daily. Take with or immediately following a meal.      . morphine (MSIR) 15 MG tablet Take 1 tablet (15 mg total) by mouth every 6 (six) hours as needed for moderate pain or severe pain.  90 tablet  0  . Multiple Vitamin (MULTIVITAMIN) tablet Take 1 tablet by mouth daily.        Marland Kitchen octreotide (SANDOSTATIN LAR) 30 MG injection Inject 30 mg into the muscle every 28 (twenty-eight) days. Octreotide for secretory diarrhea      . ondansetron (ZOFRAN) 8 MG tablet Take 8 mg by mouth every 8 (eight) hours as needed. For nausea      . oxyCODONE-acetaminophen (PERCOCET/ROXICET) 5-325 MG per tablet Take 1 tablet by mouth every 6 (six) hours as needed for moderate pain or severe pain. Take as directed  60 tablet  0  . ranitidine (ZANTAC) 300 MG tablet Take  300 mg by mouth at bedtime.      Marland Kitchen zolpidem (AMBIEN) 10 MG tablet Take 5 mg by mouth at bedtime as needed. For sleep      . loperamide (IMODIUM) 2 MG capsule Take 2 mg by mouth 4 (four) times daily as needed. For diarrhea        No current facility-administered medications for this visit.    REVIEW OF SYSTEMS:   Behavioral/Psych: Mood is stable, no new changes  All other systems were reviewed with the patient and are negative.  PHYSICAL EXAMINATION: ECOG PERFORMANCE STATUS: 3 - Symptomatic, >50% confined to  bed  Filed Vitals:   07/19/13 1125  BP: 91/64  Pulse: 71  Temp:   Resp: 18   Filed Weights   07/19/13 1124  Weight: 90 lb 9.6 oz (41.096 kg)    GENERAL:alert, in mild distress but appears uncomfortable, thin and cachectic  Musculoskeletal:no cyanosis of digits and no clubbing  NEURO: alert & oriented x 3 with fluent speech, no focal motor/sensory deficits  LABORATORY DATA:  I have reviewed the data as listed    Component Value Date/Time   NA 140 07/04/2013 1153   NA 138 05/02/2013 0340   NA 141 04/17/2013 1552   K 4.9 07/04/2013 1153   K 3.5 05/02/2013 0340   CL 106 05/02/2013 0340   CL 106 09/18/2012 1041   CO2 21* 07/04/2013 1153   CO2 23 05/02/2013 0340   GLUCOSE 106 07/04/2013 1153   GLUCOSE 119* 05/02/2013 0340   GLUCOSE 94 04/17/2013 1552   GLUCOSE 107* 09/18/2012 1041   BUN 20.7 07/04/2013 1153   BUN 9 05/02/2013 0340   BUN 15 04/17/2013 1552   CREATININE 1.2* 07/04/2013 1153   CREATININE 0.91 05/02/2013 0340   CALCIUM 10.3 07/04/2013 1153   CALCIUM 8.0* 05/02/2013 0340   PROT 7.1 07/04/2013 1153   PROT 7.0 04/30/2013 1830   PROT 6.7 04/17/2013 1552   ALBUMIN 3.8 07/04/2013 1153   ALBUMIN 4.2 04/30/2013 1830   AST 35* 07/04/2013 1153   AST 32 04/30/2013 1830   ALT 28 07/04/2013 1153   ALT 23 04/30/2013 1830   ALKPHOS 113 07/04/2013 1153   ALKPHOS 82 04/30/2013 1830   BILITOT 0.65 07/04/2013 1153   BILITOT 0.4 04/30/2013 1830   GFRNONAA 62* 05/02/2013 0340   GFRAA 72* 05/02/2013 0340    No results found for this basename: SPEP, UPEP,  kappa and lambda light chains    Lab Results  Component Value Date   WBC 5.1 07/04/2013   NEUTROABS 3.4 07/04/2013   HGB 9.8* 07/04/2013   HCT 30.1* 07/04/2013   MCV 89.3 07/04/2013   PLT 223 07/04/2013      Chemistry      Component Value Date/Time   NA 140 07/04/2013 1153   NA 138 05/02/2013 0340   NA 141 04/17/2013 1552   K 4.9 07/04/2013 1153   K 3.5 05/02/2013 0340   CL 106 05/02/2013 0340   CL 106 09/18/2012 1041   CO2 21*  07/04/2013 1153   CO2 23 05/02/2013 0340   BUN 20.7 07/04/2013 1153   BUN 9 05/02/2013 0340   BUN 15 04/17/2013 1552   CREATININE 1.2* 07/04/2013 1153   CREATININE 0.91 05/02/2013 0340   GLU 97 09/01/2010      Component Value Date/Time   CALCIUM 10.3 07/04/2013 1153   CALCIUM 8.0* 05/02/2013 0340   ALKPHOS 113 07/04/2013 1153   ALKPHOS 82 04/30/2013 1830   AST 35*  07/04/2013 1153   AST 32 04/30/2013 1830   ALT 28 07/04/2013 1153   ALT 23 04/30/2013 1830   BILITOT 0.65 07/04/2013 1153   BILITOT 0.4 04/30/2013 1830     ASSESSMENT & PLAN:  #1 systemic amyloidosis #2 chronic diarrhea #3 anemia #4 chronic back pain #5 progressive weight loss and general decline #6 recurrent falls I had a long discussion with the patient and her brother. I also discussed this with the physician at [facility]. Overall, I recommended patient to be enrolled in hospice program for supportive care only. Her brother desire her to be moved closer to where he lives so that he could visit her often. I will arrange for her to be enrolled in hospice program.   I spent 25 minutes counseling the patient face to face. The total time spent in the appointment was 40 minutes and more than 50% was on counseling.   [facility], [place], MD 07/19/2013 2:41 PM

## 2013-07-19 NOTE — Telephone Encounter (Signed)
Spoke w/ RN,  Salome Holmes, at [facility].  Informed of referral for Hospice care per Dr. Alvy Bimler and pt desires to go to [facility] where her family lives for hospice care.  Asked her to facilitate this and call us for any orders needed or questions please.  She states will discuss with their SW and work on arrangements.

## 2013-07-20 ENCOUNTER — Telehealth: Payer: Self-pay | Admitting: *Deleted

## 2013-07-20 NOTE — Telephone Encounter (Signed)
Received Call from [place] at [facility], states pt not appropriate for their level of care.  They are in patient,  Acute care hospice setting.  States pt more appropriate for residential or home care hospice level of care.  Agreed and informed her this nurse was unaware when I faxed records that was the type of hospice setting.  She recommends trying [facility] or Transitions at [facility].  [place] and s/w Case Manager, Roanna Epley.   Informed her of above and asked for them to arrange d/c planning with family to whichever hospice setting is appropriate and will work for pt and family.  Informed we will be happy to fax records to any agency that family and pt agrees to go.  She verbalized understanding and will continue to work on d/c planning to Hospice for this pt

## 2013-07-20 NOTE — Telephone Encounter (Signed)
Pt's daughter in law called to request referral to [facility] for placement at their "hospice house" facility.   [facility] [phone] and s/w Fraser Din in Referral dept..  Informed of referral and faxed over most recent progress note and copy of insurance cards to their fax [phone].  Informed Pat of pt currently at [facility] in [place],  [facility].  Pt states she has family's contact information.

## 2013-07-23 ENCOUNTER — Telehealth: Payer: Self-pay | Admitting: *Deleted

## 2013-07-23 NOTE — Telephone Encounter (Signed)
Brother Ernie Hew left a Designer, television/film set stating he is confused about placement, "was told by Hospice that [facility] does not have a bed and she would have to go home". RN called Hospice to clarify- they informed him that there are no open beds and offered to contact [facility] or other agencies and brother denied. He wants her to go to [facility].  RN left a voice mail for Roanna Epley, Tourist information centre manager at [facility] center for an update.

## 2013-07-24 ENCOUNTER — Telehealth: Payer: Self-pay | Admitting: *Deleted

## 2013-07-24 NOTE — Telephone Encounter (Signed)
Case Manager from [facility] left VM states they are trying to get pt into [facility] in [place]. There are no beds available in hospice facility in [place] or [place] area that they can find.  They need a statement of prognosis from Dr. Alvy Bimler.  Left VM for Melanie informing statement per Dr. Alvy Bimler would state 6 months or less prognosis.  Unsure what criteria is needed for Hospice facility.   Please call back if they need that statement faxed and where/who to fax it to?

## 2013-07-24 NOTE — Telephone Encounter (Signed)
Call from Hospice requesting additional information on patient.

## 2013-07-24 NOTE — Telephone Encounter (Signed)
Brother left VM states he has been told by someone at SNF that Dr. Alvy Bimler does not want pt to go to Hospice facility but to go home instead.  Called brother and left him a detailed VM explaining Dr. Alvy Bimler wants pt to go to a facility for hospice care,  but that pt may not meet the criteria to be admitted to [facility] and we have no control over that.  Other options are to stay at [facility] if facility is able to keep pt or transfer to another SNF closer to family if another facility will take pt.. Not sure if pt meets criteria to stay at SNF either.  Other option is for pt to live w/ family and hire 24 hr caregivers in the home and have hospice home care.  Dr. Alvy Bimler recommends pt stay in some type of nursing facility for 24 hr care and not live at home or with family if this is an option.  She feels pt requires more care than can be provided in home.  Informed that pt's current SNF responsible for d/c planning and we will send records and/or orders as requested to whichever placement is found for pt..  Asked him to call back if any further questions.

## 2013-07-25 ENCOUNTER — Telehealth: Payer: Self-pay | Admitting: *Deleted

## 2013-07-25 NOTE — Telephone Encounter (Signed)
S/W Jenny Reichmann at [facility] regarding referral for placement in [facility].  They request letter from Dr .Alvy Bimler stating pt's life expectancy and overall condition.  Letter completed and signed by Dr. Alvy Bimler.  See letter dated today.   Letter and recent office note faxed to [facility] at fax [phone]

## 2013-08-07 ENCOUNTER — Telehealth (INDEPENDENT_AMBULATORY_CARE_PROVIDER_SITE_OTHER): Payer: Self-pay | Admitting: *Deleted

## 2013-08-07 NOTE — Telephone Encounter (Signed)
Voice Mail left Monday 08/06/2013 at 5:40 p.m.  Mr. [name] states his sister is in rehab and is having problems with her colotomy bag.  The doctors at rehab says they cannot do anything else for her.  Everything running straight thru her and filling bag up.  Wants only Dr. Laural Golden to talk to him since she only sees "PA" when she comes here.  Needs advice on what to do.

## 2013-08-07 NOTE — Telephone Encounter (Signed)
Forwarded to Dr.Rehman for review. 

## 2013-08-10 NOTE — Telephone Encounter (Signed)
Call returned and condition discussed. She could go back on Sandostatin if her diarrhea has gotten worse off the medication. Local MD caring for patient will have to make that decision. Patient saw Dr. Alvy Bimler earlier this month who stopped Sandostatin. Patient is now in rehabilitation and [place].

## 2013-08-13 ENCOUNTER — Telehealth: Payer: Self-pay | Admitting: *Deleted

## 2013-08-13 NOTE — Telephone Encounter (Signed)
Pt's brother left a VM.  He says GI MD in [place],  Dr. Laural Golden, wants Dr. Alvy Bimler to call him at ph [phone].  He is asking why the Medication for diarrhea was stopped? (sandostatin?)

## 2013-08-14 NOTE — Telephone Encounter (Signed)
Spoke with him thanks

## 2013-08-15 ENCOUNTER — Encounter: Payer: Self-pay | Admitting: Family

## 2013-08-15 ENCOUNTER — Other Ambulatory Visit (HOSPITAL_COMMUNITY): Payer: Medicare Other

## 2013-08-15 ENCOUNTER — Ambulatory Visit: Payer: Medicare Other | Admitting: Family

## 2013-08-16 ENCOUNTER — Inpatient Hospital Stay (HOSPITAL_COMMUNITY): Admission: RE | Admit: 2013-08-16 | Payer: Medicare Other | Source: Ambulatory Visit

## 2013-08-16 ENCOUNTER — Ambulatory Visit: Payer: Medicare Other | Admitting: Family

## 2013-08-21 ENCOUNTER — Telehealth: Payer: Self-pay | Admitting: *Deleted

## 2013-08-21 NOTE — Telephone Encounter (Signed)
Brother left a VM asking Dr. Alvy Bimler to call Dr. Posey Pronto( unsure of name) in [place] at ph [phone] about "the shot."   Called back and left VM informing that Dr. Alvy Bimler did speak w/ the GI MD in [place] last week on 3/02,  Dr. Lucianne Muss, about the shot (sandostantin).   Suggested he call Dr. Laural Golden to find out the outcome of that conversation and to call us back if any questions.

## 2013-08-23 NOTE — Telephone Encounter (Signed)
Pt brother called for the pt an appt. gv appt 08/27/13@ 1pm. He is aware of this appt...td

## 2013-08-24 ENCOUNTER — Telehealth: Payer: Self-pay | Admitting: *Deleted

## 2013-08-27 ENCOUNTER — Encounter (HOSPITAL_COMMUNITY): Payer: Self-pay

## 2013-08-27 ENCOUNTER — Inpatient Hospital Stay (HOSPITAL_COMMUNITY)
Admission: AD | Admit: 2013-08-27 | Discharge: 2013-08-29 | DRG: 545 | Disposition: A | Discharge status: Hospice | Payer: Medicare Other | Source: Ambulatory Visit | Attending: Internal Medicine | Admitting: Internal Medicine

## 2013-08-27 ENCOUNTER — Ambulatory Visit (HOSPITAL_BASED_OUTPATIENT_CLINIC_OR_DEPARTMENT_OTHER): Payer: Medicare Other | Admitting: Hematology and Oncology

## 2013-08-27 ENCOUNTER — Encounter: Payer: Self-pay | Admitting: Hematology and Oncology

## 2013-08-27 VITALS — BP 110/63 | HR 75 | Temp 99.3°F | Resp 21 | Ht 63.0 in | Wt 86.0 lb

## 2013-08-27 DIAGNOSIS — G629 Polyneuropathy, unspecified: Secondary | ICD-10-CM

## 2013-08-27 DIAGNOSIS — N189 Chronic kidney disease, unspecified: Secondary | ICD-10-CM | POA: Diagnosis present

## 2013-08-27 DIAGNOSIS — G8929 Other chronic pain: Secondary | ICD-10-CM | POA: Diagnosis present

## 2013-08-27 DIAGNOSIS — R55 Syncope and collapse: Secondary | ICD-10-CM

## 2013-08-27 DIAGNOSIS — N390 Urinary tract infection, site not specified: Secondary | ICD-10-CM

## 2013-08-27 DIAGNOSIS — D72819 Decreased white blood cell count, unspecified: Secondary | ICD-10-CM

## 2013-08-27 DIAGNOSIS — M899 Disorder of bone, unspecified: Secondary | ICD-10-CM

## 2013-08-27 DIAGNOSIS — R627 Adult failure to thrive: Secondary | ICD-10-CM

## 2013-08-27 DIAGNOSIS — E859 Amyloidosis, unspecified: Secondary | ICD-10-CM

## 2013-08-27 DIAGNOSIS — Z66 Do not resuscitate: Secondary | ICD-10-CM | POA: Diagnosis present

## 2013-08-27 DIAGNOSIS — Z515 Encounter for palliative care: Secondary | ICD-10-CM

## 2013-08-27 DIAGNOSIS — Z8249 Family history of ischemic heart disease and other diseases of the circulatory system: Secondary | ICD-10-CM

## 2013-08-27 DIAGNOSIS — M549 Dorsalgia, unspecified: Secondary | ICD-10-CM | POA: Diagnosis present

## 2013-08-27 DIAGNOSIS — N039 Chronic nephritic syndrome with unspecified morphologic changes: Secondary | ICD-10-CM

## 2013-08-27 DIAGNOSIS — K529 Noninfective gastroenteritis and colitis, unspecified: Secondary | ICD-10-CM

## 2013-08-27 DIAGNOSIS — R636 Underweight: Secondary | ICD-10-CM | POA: Diagnosis present

## 2013-08-27 DIAGNOSIS — I6529 Occlusion and stenosis of unspecified carotid artery: Secondary | ICD-10-CM

## 2013-08-27 DIAGNOSIS — D696 Thrombocytopenia, unspecified: Secondary | ICD-10-CM

## 2013-08-27 DIAGNOSIS — I10 Essential (primary) hypertension: Secondary | ICD-10-CM

## 2013-08-27 DIAGNOSIS — D631 Anemia in chronic kidney disease: Secondary | ICD-10-CM | POA: Diagnosis present

## 2013-08-27 DIAGNOSIS — Z7982 Long term (current) use of aspirin: Secondary | ICD-10-CM

## 2013-08-27 DIAGNOSIS — R1310 Dysphagia, unspecified: Secondary | ICD-10-CM

## 2013-08-27 DIAGNOSIS — E162 Hypoglycemia, unspecified: Secondary | ICD-10-CM

## 2013-08-27 DIAGNOSIS — I129 Hypertensive chronic kidney disease with stage 1 through stage 4 chronic kidney disease, or unspecified chronic kidney disease: Secondary | ICD-10-CM | POA: Diagnosis present

## 2013-08-27 DIAGNOSIS — M949 Disorder of cartilage, unspecified: Secondary | ICD-10-CM

## 2013-08-27 DIAGNOSIS — Z7401 Bed confinement status: Secondary | ICD-10-CM

## 2013-08-27 DIAGNOSIS — I428 Other cardiomyopathies: Secondary | ICD-10-CM | POA: Diagnosis present

## 2013-08-27 DIAGNOSIS — E785 Hyperlipidemia, unspecified: Secondary | ICD-10-CM | POA: Diagnosis present

## 2013-08-27 DIAGNOSIS — Z87891 Personal history of nicotine dependence: Secondary | ICD-10-CM

## 2013-08-27 DIAGNOSIS — D638 Anemia in other chronic diseases classified elsewhere: Secondary | ICD-10-CM

## 2013-08-27 DIAGNOSIS — R52 Pain, unspecified: Secondary | ICD-10-CM

## 2013-08-27 DIAGNOSIS — G40909 Epilepsy, unspecified, not intractable, without status epilepticus: Secondary | ICD-10-CM | POA: Diagnosis present

## 2013-08-27 DIAGNOSIS — R64 Cachexia: Secondary | ICD-10-CM | POA: Diagnosis present

## 2013-08-27 DIAGNOSIS — R634 Abnormal weight loss: Secondary | ICD-10-CM

## 2013-08-27 DIAGNOSIS — R531 Weakness: Secondary | ICD-10-CM | POA: Diagnosis present

## 2013-08-27 DIAGNOSIS — E43 Unspecified severe protein-calorie malnutrition: Secondary | ICD-10-CM

## 2013-08-27 DIAGNOSIS — Z79899 Other long term (current) drug therapy: Secondary | ICD-10-CM

## 2013-08-27 DIAGNOSIS — C9000 Multiple myeloma not having achieved remission: Secondary | ICD-10-CM | POA: Diagnosis present

## 2013-08-27 DIAGNOSIS — R5383 Other fatigue: Secondary | ICD-10-CM

## 2013-08-27 DIAGNOSIS — R5381 Other malaise: Secondary | ICD-10-CM | POA: Diagnosis present

## 2013-08-27 DIAGNOSIS — R197 Diarrhea, unspecified: Secondary | ICD-10-CM

## 2013-08-27 DIAGNOSIS — M898X9 Other specified disorders of bone, unspecified site: Secondary | ICD-10-CM

## 2013-08-27 DIAGNOSIS — K219 Gastro-esophageal reflux disease without esophagitis: Secondary | ICD-10-CM | POA: Diagnosis present

## 2013-08-27 DIAGNOSIS — N179 Acute kidney failure, unspecified: Secondary | ICD-10-CM

## 2013-08-27 DIAGNOSIS — Z681 Body mass index (BMI) 19 or less, adult: Secondary | ICD-10-CM

## 2013-08-27 LAB — MRSA PCR SCREENING: MRSA BY PCR: NEGATIVE

## 2013-08-27 MED ORDER — HYDROMORPHONE HCL PF 1 MG/ML IJ SOLN
1.0000 mg | INTRAMUSCULAR | Status: DC | PRN
  Administered 2013-08-27 – 2013-08-28 (×2): 1 mg via INTRAVENOUS
  Filled 2013-08-27 (×3): qty 1

## 2013-08-27 MED ORDER — ALUM & MAG HYDROXIDE-SIMETH 200-200-20 MG/5ML PO SUSP: 30.0000 mL | Freq: Four times a day (QID) | ORAL | Status: DC | PRN

## 2013-08-27 MED ORDER — METOPROLOL SUCCINATE ER 50 MG PO TB24: 50.0000 mg | ORAL_TABLET | Freq: Every day | ORAL | Status: DC

## 2013-08-27 MED ORDER — SODIUM CHLORIDE 0.9 % IJ SOLN: 3.0000 mL | INTRAMUSCULAR | Status: DC | PRN

## 2013-08-27 MED ORDER — ONDANSETRON HCL 4 MG PO TABS: 4.0000 mg | ORAL_TABLET | Freq: Four times a day (QID) | ORAL | Status: DC | PRN

## 2013-08-27 MED ORDER — DIPHENOXYLATE-ATROPINE 2.5-0.025 MG PO TABS: 2.0000 | ORAL_TABLET | Freq: Four times a day (QID) | ORAL | Status: DC | PRN

## 2013-08-27 MED ORDER — MORPHINE SULFATE 15 MG PO TABS
15.0000 mg | ORAL_TABLET | Freq: Four times a day (QID) | ORAL | Status: DC | PRN
  Administered 2013-08-28: 15 mg via ORAL
  Filled 2013-08-27: qty 1

## 2013-08-27 MED ORDER — ACETAMINOPHEN 650 MG RE SUPP: 650.0000 mg | Freq: Four times a day (QID) | RECTAL | Status: DC | PRN

## 2013-08-27 MED ORDER — CALCIUM CARBONATE-VIT D-MIN 1200-1000 MG-UNIT PO CHEW: 1.0000 | CHEWABLE_TABLET | Freq: Two times a day (BID) | ORAL | Status: DC

## 2013-08-27 MED ORDER — ACETAMINOPHEN 325 MG PO TABS: 650.0000 mg | ORAL_TABLET | Freq: Four times a day (QID) | ORAL | Status: DC | PRN

## 2013-08-27 MED ORDER — PANCRELIPASE (LIP-PROT-AMYL) 12000-38000 UNITS PO CPEP
1.0000 | ORAL_CAPSULE | Freq: Three times a day (TID) | ORAL | Status: DC
  Filled 2013-08-27 (×2): qty 1

## 2013-08-27 MED ORDER — BOOST / RESOURCE BREEZE PO LIQD
1.0000 | Freq: Three times a day (TID) | ORAL | Status: DC
  Administered 2013-08-27 – 2013-08-29 (×6): 1 via ORAL

## 2013-08-27 MED ORDER — MEGESTROL ACETATE 40 MG/ML PO SUSP
200.0000 mg | Freq: Every day | ORAL | Status: DC
  Administered 2013-08-27: 200 mg via ORAL
  Filled 2013-08-27 (×2): qty 5

## 2013-08-27 MED ORDER — FAMOTIDINE 20 MG PO TABS
20.0000 mg | ORAL_TABLET | Freq: Two times a day (BID) | ORAL | Status: DC
  Administered 2013-08-27: 20 mg via ORAL
  Filled 2013-08-27 (×3): qty 1

## 2013-08-27 MED ORDER — ASPIRIN EC 81 MG PO TBEC
81.0000 mg | DELAYED_RELEASE_TABLET | Freq: Every day | ORAL | Status: DC
Start: 2013-08-27 — End: 2013-08-27

## 2013-08-27 MED ORDER — SODIUM CHLORIDE 0.9 % IV SOLN: 250.0000 mL | INTRAVENOUS | Status: DC | PRN

## 2013-08-27 MED ORDER — SODIUM CHLORIDE 0.9 % IJ SOLN
3.0000 mL | Freq: Two times a day (BID) | INTRAMUSCULAR | Status: DC
  Administered 2013-08-27 – 2013-08-29 (×4): 3 mL via INTRAVENOUS

## 2013-08-27 MED ORDER — GABAPENTIN 300 MG PO CAPS
300.0000 mg | ORAL_CAPSULE | Freq: Three times a day (TID) | ORAL | Status: DC
  Filled 2013-08-27 (×2): qty 1

## 2013-08-27 MED ORDER — ONE-DAILY MULTI VITAMINS PO TABS: 1.0000 | ORAL_TABLET | Freq: Every day | ORAL | Status: DC

## 2013-08-27 MED ORDER — ADULT MULTIVITAMIN W/MINERALS CH: 1.0000 | ORAL_TABLET | Freq: Every day | ORAL | Status: DC

## 2013-08-27 MED ORDER — VITAMIN D3 25 MCG (1000 UNIT) PO TABS: 1000.0000 [IU] | ORAL_TABLET | Freq: Every day | ORAL | Status: DC

## 2013-08-27 MED ORDER — ZOLPIDEM TARTRATE 5 MG PO TABS: 5.0000 mg | ORAL_TABLET | Freq: Every evening | ORAL | Status: DC | PRN

## 2013-08-27 MED ORDER — ATORVASTATIN CALCIUM 40 MG PO TABS: 40.0000 mg | ORAL_TABLET | Freq: Every day | ORAL | Status: DC

## 2013-08-27 MED ORDER — ONDANSETRON HCL 4 MG/2ML IJ SOLN: 4.0000 mg | Freq: Four times a day (QID) | INTRAMUSCULAR | Status: DC | PRN

## 2013-08-27 NOTE — Progress Notes (Signed)
[facility] OFFICE PROGRESS NOTE  Patient Care Team: Chipper Herb, MD as PCP - General Deeann Saint, MD (Hematology and Oncology) Larey Dresser, MD (Cardiology) Rogene Houston, MD (Gastroenterology) Jeanann Lewandowsky as Consulting Physician (Hematology and Oncology)  DIAGNOSIS: Systemic amyloidosis, failure to thrive  SUMMARY OF ONCOLOGIC HISTORY: she had chronic anemia for a long time and developed syncopal episode. She has extensive workup somewhere and was diagnosed with systemic amyloidosis. She had evaluation and second opinion at [facility] and was deemed not a candidate for bone marrow transplant. She had been treated with Velcade and dexamethasone in the past with some partial response. The patient developed intractable diarrhea with bowel perforation in 2011 and since then she has been placed on observation and had been receiving octreotide In November 2014, she was admitted to the hospital due to urinary tract infection In January 2015, due to progressive decline, we discontinue octreotide From January to March 2015, showed recurrent admission to the hospital due to various problems. INTERVAL HISTORY: [name] 73 y.o. female returns for further workup. Since last time I saw her, I referred her to get a second opinion. After much discussion, overall due to very poor performance status, I recommend hospice placement. Since last time I saw her, she was transferred from one skilled nursing facility to another and was not successfully placed into a resident hospice facility. She is miserable. She could hardly drink. With each intake of food, she would have diarrhea immediately. Every day, she had multiple accidents with uncontrolled diarrhea. She has uncontrolled pain. She has lost more weight since last time I saw her. I have reviewed the past medical history, past surgical history, social history and family history with the patient and they are  unchanged from previous note.  ALLERGIES:  has No Known Allergies.  MEDICATIONS:  Current Outpatient Prescriptions  Medication Sig Dispense Refill  . aspirin EC 81 MG tablet Take 1 tablet (81 mg total) by mouth daily.      Marland Kitchen atorvastatin (LIPITOR) 40 MG tablet Take 40 mg by mouth daily.      . Calcium Carbonate-Vit D-Min 1200-1000 MG-UNIT CHEW Chew 1 tablet by mouth 2 (two) times daily.      . cholecalciferol (VITAMIN D) 400 UNITS TABS tablet Take 1,000 Units by mouth.      . diphenoxylate-atropine (LOMOTIL) 2.5-0.025 MG per tablet TAKE ONE TABLET BY MOUTH EVERY 6 HOURS AS NEEDED FOR DIARRHEA  120 tablet  2  . gabapentin (NEURONTIN) 300 MG capsule Take 1 capsule (300 mg total) by mouth 3 (three) times daily.  90 capsule  6  . lipase/protease/amylase (CREON-10/PANCREASE) 12000 UNITS CPEP Take 1 capsule by mouth 3 (three) times daily.       Marland Kitchen loperamide (IMODIUM) 2 MG capsule Take 2 mg by mouth 4 (four) times daily as needed. For diarrhea       . megestrol (MEGACE) 40 MG/ML suspension Take 200 mg by mouth 2 (two) times daily as needed. For appetite control      . metoprolol succinate (TOPROL-XL) 50 MG 24 hr tablet Take 50 mg by mouth daily. Take with or immediately following a meal.      . morphine (MSIR) 15 MG tablet Take 1 tablet (15 mg total) by mouth every 6 (six) hours as needed for moderate pain or severe pain.  90 tablet  0  . Multiple Vitamin (MULTIVITAMIN) tablet Take 1 tablet by mouth daily.        Marland Kitchen  octreotide (SANDOSTATIN LAR) 30 MG injection Inject 30 mg into the muscle every 28 (twenty-eight) days. Octreotide for secretory diarrhea      . ondansetron (ZOFRAN) 8 MG tablet Take 8 mg by mouth every 8 (eight) hours as needed. For nausea      . oxyCODONE-acetaminophen (PERCOCET/ROXICET) 5-325 MG per tablet Take 1 tablet by mouth every 6 (six) hours as needed for moderate pain or severe pain. Take as directed  60 tablet  0  . ranitidine (ZANTAC) 300 MG tablet Take 300 mg by mouth at  bedtime.      Marland Kitchen zolpidem (AMBIEN) 10 MG tablet Take 5 mg by mouth at bedtime as needed. For sleep       No current facility-administered medications for this visit.    REVIEW OF SYSTEMS:   Constitutional: Denies fevers, chills Eyes: Denies blurriness of vision Ears, nose, mouth, throat, and face: Denies mucositis or sore throat All other systems were reviewed with the patient and are negative.  PHYSICAL EXAMINATION: ECOG PERFORMANCE STATUS: 4 - Bedbound  Filed Vitals:   08/27/13 1332  BP: 110/63  Pulse: 75  Temp: 99.3 F (37.4 C)  Resp: 21   Filed Weights   08/27/13 1332  Weight: 86 lb (39.009 kg)    GENERAL:alert, in moderate distress and appeared uncomfortable. She looks thin, cachectic and miserable in pain SKIN: No bruising noted. Clinically reduced skin turgor are due to severe dehydration EYES: normal, Conjunctiva are pink and non-injected, sclera clear OROPHARYNX:no exudate, no erythema and lips, buccal mucosa, and tongue normal . No thrush NECK: supple, thyroid normal size, non-tender, without nodularity LYMPH:  no palpable lymphadenopathy in the cervical, axillary or inguinal LUNGS: clear to auscultation and percussion with normal breathing effort HEART: regular rate & rhythm and no murmurs and no lower extremity edema ABDOMEN:abdomen soft, non-tender and normal bowel sounds. Colostomy site looks okay Musculoskeletal:no cyanosis of digits and no clubbing  NEURO: alert & oriented x 3 with fluent speech, no focal motor/sensory deficits  LABORATORY DATA:  I have reviewed the data as listed    Component Value Date/Time   NA 140 07/04/2013 1153   NA 138 05/02/2013 0340   NA 141 04/17/2013 1552   K 4.9 07/04/2013 1153   K 3.5 05/02/2013 0340   CL 106 05/02/2013 0340   CL 106 09/18/2012 1041   CO2 21* 07/04/2013 1153   CO2 23 05/02/2013 0340   GLUCOSE 106 07/04/2013 1153   GLUCOSE 119* 05/02/2013 0340   GLUCOSE 94 04/17/2013 1552   GLUCOSE 107* 09/18/2012 1041   BUN  20.7 07/04/2013 1153   BUN 9 05/02/2013 0340   BUN 15 04/17/2013 1552   CREATININE 1.2* 07/04/2013 1153   CREATININE 0.91 05/02/2013 0340   CALCIUM 10.3 07/04/2013 1153   CALCIUM 8.0* 05/02/2013 0340   PROT 7.1 07/04/2013 1153   PROT 7.0 04/30/2013 1830   PROT 6.7 04/17/2013 1552   ALBUMIN 3.8 07/04/2013 1153   ALBUMIN 4.2 04/30/2013 1830   AST 35* 07/04/2013 1153   AST 32 04/30/2013 1830   ALT 28 07/04/2013 1153   ALT 23 04/30/2013 1830   ALKPHOS 113 07/04/2013 1153   ALKPHOS 82 04/30/2013 1830   BILITOT 0.65 07/04/2013 1153   BILITOT 0.4 04/30/2013 1830   GFRNONAA 62* 05/02/2013 0340   GFRAA 72* 05/02/2013 0340    No results found for this basename: SPEP,  UPEP,   kappa and lambda light chains    Lab Results  Component Value Date  WBC 5.1 07/04/2013   NEUTROABS 3.4 07/04/2013   HGB 9.8* 07/04/2013   HCT 30.1* 07/04/2013   MCV 89.3 07/04/2013   PLT 223 07/04/2013      Chemistry      Component Value Date/Time   NA 140 07/04/2013 1153   NA 138 05/02/2013 0340   NA 141 04/17/2013 1552   K 4.9 07/04/2013 1153   K 3.5 05/02/2013 0340   CL 106 05/02/2013 0340   CL 106 09/18/2012 1041   CO2 21* 07/04/2013 1153   CO2 23 05/02/2013 0340   BUN 20.7 07/04/2013 1153   BUN 9 05/02/2013 0340   BUN 15 04/17/2013 1552   CREATININE 1.2* 07/04/2013 1153   CREATININE 0.91 05/02/2013 0340   GLU 97 09/01/2010      Component Value Date/Time   CALCIUM 10.3 07/04/2013 1153   CALCIUM 8.0* 05/02/2013 0340   ALKPHOS 113 07/04/2013 1153   ALKPHOS 82 04/30/2013 1830   AST 35* 07/04/2013 1153   AST 32 04/30/2013 1830   ALT 28 07/04/2013 1153   ALT 23 04/30/2013 1830   BILITOT 0.65 07/04/2013 1153   BILITOT 0.4 04/30/2013 1830     ASSESSMENT & PLAN:  #1 systemic amyloidosis #2 failure to thrive #3 uncontrolled diarrhea #4 progressive weight loss with dehydration #5 severe pain In the compassionate manner, I recommend direct admission with palliative care consult for symptomatic management and referral  to inpatient hospice. The patient agreed to be admitted for those reasons. I called the hospitalist for admission and she graciously accepted to take care of the patient in the hospital.   All questions were answered. The patient knows to call the clinic with any problems, questions or concerns. No barriers to learning was detected. I spent 15 minutes counseling the patient face to face. The total time spent in the appointment was 20 minutes and more than 50% was on counseling and review of test results     [facility], Meliss Fleek, MD 08/27/2013 2:11 PM

## 2013-08-27 NOTE — Progress Notes (Signed)
Thank you for consulting the Palliative Medicine Team at [facility] to meet your patient's and family's needs.   The reason that you asked [name] to see your patient is for [name], options, symptom management.   We have scheduled your patient for a meeting: 3/17 10 am with Wadie Lessen, NP  The Surrogate decision make is: [name] (brother - RN says he cares for [name]) Contact information: [phone]   Your patient is able/unable to participate: able  Vinie Sill, NP Palliative Medicine Team Pager # [phone] (M-F 8a-5p) Team Phone # [phone] (Nights/Weekends)

## 2013-08-27 NOTE — H&P (Addendum)
History and Physical  [name] UVO:[id number] DOB: 02/15/1941 DOA: 08/27/2013  Referring physician: Dr. Heath Lark PCP: Redge Gainer, MD   Chief Complaint: Weakness, FTT   History of Present Illness: [name] is an 73 y.o. 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  She was evaluated at [facility] for BMT, but was not a candidate.  She has been taken off Octreotide 06/2013 due to lack of response.  She was hospitalized at [facility] and ultimately sent to a SNF for rehabilitation where she has been unable to participate in PT secondary to weakness and generalized pain.  The patient complains of pain from the tips of her toes to the tips of her fingers.  She has had progressive weight loss, increasing fatigue and ongoing, chronic diarrhea but no black or bloody stools, no fever or chills and no nausea or vomiting.  She feels miserable, is hardly drinking, and any oral intake of solid foods cause her to have uncontrolled diarrhea with bowel incontinence.  She is severely malnourished and underweight with a BMI of 15.3.  She has been essentially bedbound.  She affirms to me that she would like to be placed in a residential hospice facility with her goals being symptom control.  She is a DNR.  Review of Systems: Constitutional: No fever, no chills;  Appetite poor; + weight loss, no weight gain, + fatigue.  HEENT: No blurry vision, no diplopia, no pharyngitis, no dysphagia CV: No chest pain, no palpitations, no PND.  Resp: + occasional SOB, no cough, no pleuritic pain. GI: No nausea, no vomiting, + chronic diarrhea, no melena, no hematochezia, no constipation.  GU: No dysuria, no hematuria, no frequency, no urgency. MSK: + myalgias, + arthralgias.  Neuro:  No headache, no focal neurological deficits, no history of  seizures, + h/o syncope.  Psych: No depression, no anxiety.  Endo: No heat intolerance, no cold intolerance, no polyuria, no polydipsia  Skin: No rashes, no skin lesions.  Heme: No easy bruising.  Travel history:   Past Medical History Past Medical History  Diagnosis Date  . History of CEA (carotid endarterectomy) 03/2007    Left CEA by Dr. Gae Gallop  . Hypertension   . Hyperlipidemia   . GERD (gastroesophageal reflux disease)   . Anemia in chronic kidney disease(285.21) 02/2009    She has been on Aranesp for ? anemia of chronic kidney disease though renal function does not seem severely impaired.  She had repeat bone marrow biopsy by Dr. Sonny Dandy 9/10.  Marland Kitchen Episode of syncope     dysauthonomia/ orthostatic syncope - Patient has Medtronic loop recorder implanted 8/10.  No events so far - May be due only to orthostasis  . 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    . History of MRI 01/2009    CardiacMRI/EF 66%, mild to moderate concentric LVH, RV size & systolic function normAL w/o evidence for ARVC, trivial pericardial effusion/On delayed enhancement, it was difficult to obtain the TI/There was patchy mid-wall basal delayed enhancement/There was partially circumferential subendocardialdelayed enhancement in the mid to apical LV/This was suggestive of infiltrative cardiomyopathy versus  . History of CT scan of chest 01/2009    to assess for sarcoidosis: no evidence by CT for pulmonary sarcoid  . Diverticulitis 06/2009  with perforation and colostomy.  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   . Secretory diarrhea      treated with octreotide.   . Esophageal stricture 05/2010    with dilation  . Amyloidosis   . Shortness of breath     when tired  . Cancer 2010    Cancer of the Blood stream  . Chronic back pain 04/10/2013  . Seizure disorder 06/06/2013  . Bone pain 07/04/2013     Past Surgical  History Past Surgical History  Procedure Laterality Date  . Implantable loop recorder  2010    MDT for unexplained syncope  . Partial colectomy  1/11    for diverticular perforation  . Carotid endarterectomy  10/08    left  . Abdominal hysterectomy    . Upper gastrointestinal endoscopy  05/20/2010    EGD ED  . Colonoscopy  03/14/2009  . Ercp  04/09/2011    Procedure: ENDOSCOPIC RETROGRADE CHOLANGIOPANCREATOGRAPHY (ERCP);  Surgeon: Rogene Houston, MD;  Location: [facility] ORS;  Service: Endoscopy;  Laterality: N/A;  10:25  . Sphincterotomy  04/09/2011    Procedure: SPHINCTEROTOMY;  Surgeon: Rogene Houston, MD;  Location: [facility] ORS;  Service: Endoscopy;  Laterality: N/A;  with removal of multiple pigment stones  . Loop recorder explant  Feb. 18, 2014    removed 3 months ago. at [facility]  . Esophagogastroduodenoscopy (egd) with esophageal dilation N/A 12/19/2012    Procedure: ESOPHAGOGASTRODUODENOSCOPY (EGD) WITH ESOPHAGEAL DILATION;  Surgeon: Rogene Houston, MD;  Location: [facility] ENDO SUITE;  Service: Endoscopy;  Laterality: N/A;  1030     Social History: History   Social History  . Marital Status: Divorced    Spouse Name: N/A    Number of Children: N/A  . Years of Education: N/A   Occupational History  . Not on file.   Social History Main Topics  . Smoking status: Former Smoker -- 1.00 packs/day for 5 years    Types: Cigarettes    Quit date: 09/07/1978  . Smokeless tobacco: Never Used  . Alcohol Use: No  . Drug Use: No  . Sexual Activity: No   Other Topics Concern  . Not on file   Social History Narrative   Quit tobacco in 1980.     No ETOH or drugs.     Single, lives in [place], 1 child (daughter in [place]).     Worked at [employer], now retired.     Family History:  Family History  Problem Relation Age of Onset  . Heart failure Mother   . Hypertension Mother   . Hyperlipidemia Mother   . Heart attack Mother   . Heart failure Father   . Anesthesia  problems Neg Hx   . Hypotension Neg Hx   . Malignant hyperthermia Neg Hx   . Pseudochol deficiency Neg Hx     Allergies: Review of patient's allergies indicates no known allergies.  Meds: Prior to Admission medications   Medication Sig Start Date End Date Taking? Authorizing Provider  aspirin EC 81 MG tablet Take 1 tablet (81 mg total) by mouth daily. 10/23/10   Larey Dresser, MD  atorvastatin (LIPITOR) 40 MG tablet Take 40 mg by mouth daily.    Historical Provider, MD  Calcium Carbonate-Vit D-Min 1200-1000 MG-UNIT CHEW Chew 1 tablet by mouth 2 (two) times daily.    Historical Provider, MD  cholecalciferol (VITAMIN D) 400 UNITS TABS tablet Take 1,000 Units by mouth.  Historical Provider, MD  diphenoxylate-atropine (LOMOTIL) 2.5-0.025 MG per tablet TAKE ONE TABLET BY MOUTH EVERY 6 HOURS AS NEEDED FOR DIARRHEA 01/17/13   Conni Slipper, MD  gabapentin (NEURONTIN) 300 MG capsule Take 1 capsule (300 mg total) by mouth 3 (three) times daily. 03/05/13   Heath Lark, MD  lipase/protease/amylase (CREON-10/PANCREASE) 12000 UNITS CPEP Take 1 capsule by mouth 3 (three) times daily.     Historical Provider, MD  loperamide (IMODIUM) 2 MG capsule Take 2 mg by mouth 4 (four) times daily as needed. For diarrhea     Historical Provider, MD  megestrol (MEGACE) 40 MG/ML suspension Take 200 mg by mouth 2 (two) times daily as needed. For appetite control 09/23/10   Historical Provider, MD  metoprolol succinate (TOPROL-XL) 50 MG 24 hr tablet Take 50 mg by mouth daily. Take with or immediately following a meal.    Historical Provider, MD  morphine (MSIR) 15 MG tablet Take 1 tablet (15 mg total) by mouth every 6 (six) hours as needed for moderate pain or severe pain. 07/04/13   Heath Lark, MD  Multiple Vitamin (MULTIVITAMIN) tablet Take 1 tablet by mouth daily.      Historical Provider, MD  octreotide (SANDOSTATIN LAR) 30 MG injection Inject 30 mg into the muscle every 28 (twenty-eight) days. Octreotide for  secretory diarrhea    Historical Provider, MD  ondansetron (ZOFRAN) 8 MG tablet Take 8 mg by mouth every 8 (eight) hours as needed. For nausea    Historical Provider, MD  oxyCODONE-acetaminophen (PERCOCET/ROXICET) 5-325 MG per tablet Take 1 tablet by mouth every 6 (six) hours as needed for moderate pain or severe pain. Take as directed 05/08/13   Heath Lark, MD  ranitidine (ZANTAC) 300 MG tablet Take 300 mg by mouth at bedtime.    Historical Provider, MD  zolpidem (AMBIEN) 10 MG tablet Take 5 mg by mouth at bedtime as needed. For sleep    Historical Provider, MD    Physical Exam: Filed Vitals:   08/27/13 1433  BP: 146/70  Pulse: 82  Temp: 98.5 F (36.9 C)  TempSrc: Oral  Resp: 18  Height: $Remove'5\' 3"'YOGBhTS$  (1.6 m)  Weight: 39.009 kg (86 lb)  SpO2: 100%     Physical Exam: Blood pressure 146/70, pulse 82, temperature 98.5 F (36.9 C), temperature source Oral, resp. rate 18, height $RemoveBe'5\' 3"'KpVUABtQu$  (1.6 m), weight 39.009 kg (86 lb), SpO2 100.00%. Gen: Cachectic, moderate distress, in pain. Head: Normocephalic, atraumatic. Eyes: PERRL, EOMI, sclerae nonicteric. Mouth: Oropharynx is clear without thrush. Neck: Supple, no thyromegaly, no lymphadenopathy, no jugular venous distention. Chest: Lungs CTAB. CV: Heart sounds are regular.  No M/R/G. Abdomen: Soft, nontender, nondistended with normal active bowel sounds.  Ileostomy present. Extremities: Extremities are without C/E/C. Skin: Warm and dry. Neuro: Alert; cranial nerves II through XII grossly intact. Psych: Mood and affect anxious, depressed.  Labs on Admission:  Basic Metabolic Panel: No labs, comfort care.  Radiological Exams on Admission: No results found.    Assessment/Plan Principal Problem:   Palliative care encounter secondary to end stage amyloidosis with generalized weakness, failure to thrive Formal palliative care consult requested to assist with symptom management and residential hospice placement.  The patient is too weak to  have further re-hab, with severe malnourishment and cachexia.  We will address her pain with PRN MSIR and IV dilaudid as needed for breakthrough pain. Active Problems:   Amyloidosis No further treatment options.  Palliative care.   Protein-calorie malnutrition, severe Dietician consult.   Generalized weakness  Secondary to end stage amyloidosis, severe malnutrition/cachexia.  Would not try PT at this time.  Comfort care.   Chronic diarrhea Lomotil PRN.   DVT prophylaxis SCDs only.  No Lovenox in light of comfort care.  Code Status: DNR Family Communication: Delrae Rend [phone] cell, [phone] home), brother updated at bedside. Disposition Plan: Residential hospice.  Time spent: 70 minutes.  Schuyler Behan [facility] Pager [phone] Cell: [phone]   If 7PM-7AM, please contact night-coverage [email or web address] Password [email or web address] 08/27/2013, 3:03 PM    **Disclaimer: This note was dictated with voice recognition software. Similar sounding words can inadvertently be transcribed and this note may contain transcription errors which may not have been corrected upon publication of note.**

## 2013-08-27 NOTE — Progress Notes (Addendum)
INITIAL NUTRITION ASSESSMENT  DOCUMENTATION CODES Per approved criteria  -Severe malnutrition in the context of chronic illness -Underweight  Pt meets criteria for severe MALNUTRITION in the context of chronic illness as evidenced by severe muscle wasting and subcutaneous fat loss, 10% weight loss in one month,and PO intake <75% for > one month.   INTERVENTION: -Recommend Resource Breeze po TID, each supplement provides 250 kcal and 9 grams of protein -Recommend 10AM/2PM/8PM snacks -Reviewed high calorie/high protein food options -Encouraged intake of soluble fiber to assist in stools -Continue appetite stimulant regimen- 200 mg BID -Will continue to monitor  NUTRITION DIAGNOSIS: Inadequate oral intake related to decreased appetite/loose stools as evidenced by PO intake <75%, wt loss.   Goal: Pt to meet >/= 90% of their estimated nutrition needs    Monitor:  Total protein/energy intake, labs, weights, GI profile, swallow profile  Reason for Assessment: Consult/MST  73 y.o. female  Admitting Dx: Palliative care encounter  ASSESSMENT: [name] is an 73 y.o. 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 She was evaluated at [facility] for BMT, but was not a candidate. She has been taken off Octreotide 06/2013 due to lack of response. She was hospitalized at [facility] and ultimately sent to a SNF for rehabilitation where she has been unable to participate in PT secondary to weakness and generalized pain.  -Diet recall indicates pt consuming one egg for breakfast, and small lunch and dinner-normally mashed potatoes and soft proteins.  -Was drinking [employer] as she tolerates this supplement more than Ensure/Boost. Will add TID -Has had ongoing loose stools. Encouraged soluble fiber intake -Pt  has been instructed to eat every 2 hours to assist in weight gain. Was willing to incorporate snacks and supplements. Reviewed soft high protein foods-peanut butter, eggs, yogurt, chicken salad/tuna salad, and MagicCup supplement. Will add in HealthTouch -Weight continues to decline. Has lost 10 lbs in 3 months Nutrition Focused Physical Exam:  Subcutaneous Fat:  Orbital Region: severe Upper Arm Region: severe Thoracic and Lumbar Region: n/a  Muscle:  Temple Region: severe Clavicle Bone Region: severe Clavicle and Acromion Bone Region: severe Scapular Bone Region: n/a Dorsal Hand: severe Patellar Region: n/a Anterior Thigh Region: n/a Posterior Calf Region: n/a  Edema: none    Height: Ht Readings from Last 1 Encounters:  08/27/13 5\' 3"  (1.6 m)    Weight: Wt Readings from Last 1 Encounters:  08/27/13 86 lb (39.009 kg)    Ideal Body Weight: 115 lbs  % Ideal Body Weight: 75%  Wt Readings from Last 10 Encounters:  08/27/13 86 lb (39.009 kg)  08/27/13 86 lb (39.009 kg)  07/19/13 90 lb 9.6 oz (41.096 kg)  07/04/13 91 lb 9.6 oz (41.549 kg)  06/28/13 90 lb 6.4 oz (41.005 kg)  06/06/13 96 lb 6.4 oz (43.727 kg)  05/08/13 102 lb 9.6 oz (46.539 kg)  05/01/13 100 lb 12 oz (45.7 kg)  04/17/13 100 lb (45.36 kg)  04/10/13 102 lb 14.4 oz (46.675 kg)    Usual Body Weight: 110 lbs  % Usual Body Weight: 78%  BMI:  Body mass index is 15.24 kg/(m^2). Underweight  Estimated Nutritional Needs: Kcal: 1400-1600 Protein: 60-70 gram Fluid: >/=1400 ml/daily  Skin: WDL  Diet Order: General  EDUCATION NEEDS: -Education needs addressed  No intake or output data in the 24 hours ending 08/27/13  1538  Last BM: 3/16   Labs:  No results found for this basename: NA, K, CL, CO2, BUN, CREATININE, CALCIUM, MG, PHOS, GLUCOSE,  in the last 168 hours  CBG (last 3)  No results found for this basename: GLUCAP,  in the last 72 hours  Scheduled Meds: . famotidine  20 mg Oral BID  .  sodium chloride  3 mL Intravenous Q12H    Continuous Infusions:   Past Medical History  Diagnosis Date  . History of CEA (carotid endarterectomy) 03/2007    Left CEA by Dr. Gae Gallop  . Hypertension   . Hyperlipidemia   . GERD (gastroesophageal reflux disease)   . Anemia in chronic kidney disease(285.21) 02/2009    She has been on Aranesp for ? anemia of chronic kidney disease though renal function does not seem severely impaired.  She had repeat bone marrow biopsy by Dr. Sonny Dandy 9/10.  Marland Kitchen Episode of syncope     dysauthonomia/ orthostatic syncope - Patient has Medtronic loop recorder implanted 8/10.  No events so far - May be due only to orthostasis  . 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    . History of MRI 01/2009    CardiacMRI/EF 66%, mild to moderate concentric LVH, RV size & systolic function normAL w/o evidence for ARVC, trivial pericardial effusion/On delayed enhancement, it was difficult to obtain the TI/There was patchy mid-wall basal delayed enhancement/There was partially circumferential subendocardialdelayed enhancement in the mid to apical LV/This was suggestive of infiltrative cardiomyopathy versus  . History of CT scan of chest 01/2009    to assess for sarcoidosis: no evidence by CT for pulmonary sarcoid  . 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   . Secretory diarrhea      treated with octreotide.   . Esophageal stricture 05/2010    with dilation  . Amyloidosis   . Shortness of breath     when tired  . Cancer 2010    Cancer of the Blood stream  . Chronic back pain 04/10/2013  . Seizure disorder 06/06/2013  . Bone pain 07/04/2013    Past Surgical History  Procedure Laterality Date  . Implantable loop recorder  2010    MDT for unexplained syncope  . Partial colectomy  1/11    for diverticular perforation  . Carotid  endarterectomy  10/08    left  . Abdominal hysterectomy    . Upper gastrointestinal endoscopy  05/20/2010    EGD ED  . Colonoscopy  03/14/2009  . Ercp  04/09/2011    Procedure: ENDOSCOPIC RETROGRADE CHOLANGIOPANCREATOGRAPHY (ERCP);  Surgeon: Rogene Houston, MD;  Location: [facility] ORS;  Service: Endoscopy;  Laterality: N/A;  10:25  . Sphincterotomy  04/09/2011    Procedure: SPHINCTEROTOMY;  Surgeon: Rogene Houston, MD;  Location: [facility] ORS;  Service: Endoscopy;  Laterality: N/A;  with removal of multiple pigment stones  . Loop recorder explant  Feb. 18, 2014    removed 3 months ago. at [facility]  . Esophagogastroduodenoscopy (egd) with esophageal dilation N/A 12/19/2012    Procedure: ESOPHAGOGASTRODUODENOSCOPY (EGD) WITH ESOPHAGEAL DILATION;  Surgeon: Rogene Houston, MD;  Location: [facility] ENDO SUITE;  Service: Endoscopy;  Laterality: N/A;  [place] LDN Clinical Dietitian MCNOB:[phone]

## 2013-08-27 NOTE — Telephone Encounter (Signed)
Late entry.   Brother left VM last week requesting pt to see Dr. Alvy Bimler.  He was concerned about her condition and ongoing weight loss.   Called Brother back on Friday 3/13 and left him a VM to return nurse's call.   He did not call back but apparently was able to get pt scheduled w/ Dr. Alvy Bimler for today.

## 2013-08-28 DIAGNOSIS — Z515 Encounter for palliative care: Secondary | ICD-10-CM

## 2013-08-28 DIAGNOSIS — R5381 Other malaise: Secondary | ICD-10-CM

## 2013-08-28 DIAGNOSIS — R52 Pain, unspecified: Secondary | ICD-10-CM

## 2013-08-28 DIAGNOSIS — R531 Weakness: Secondary | ICD-10-CM

## 2013-08-28 DIAGNOSIS — R1310 Dysphagia, unspecified: Secondary | ICD-10-CM

## 2013-08-28 DIAGNOSIS — R5383 Other fatigue: Secondary | ICD-10-CM

## 2013-08-28 DIAGNOSIS — R627 Adult failure to thrive: Secondary | ICD-10-CM

## 2013-08-28 DIAGNOSIS — D638 Anemia in other chronic diseases classified elsewhere: Secondary | ICD-10-CM

## 2013-08-28 MED ORDER — LORAZEPAM 2 MG/ML IJ SOLN: 1.0000 mg | INTRAMUSCULAR | Status: DC | PRN

## 2013-08-28 MED ORDER — HYDROMORPHONE HCL PF 2 MG/ML IJ SOLN
2.0000 mg | INTRAMUSCULAR | Status: DC | PRN
  Administered 2013-08-28: 2 mg via INTRAVENOUS

## 2013-08-28 MED ORDER — MORPHINE SULFATE 2 MG/ML IJ SOLN
1.0000 mg | INTRAMUSCULAR | Status: DC | PRN
  Administered 2013-08-28 – 2013-08-29 (×2): 1 mg via INTRAVENOUS
  Filled 2013-08-28 (×2): qty 1

## 2013-08-28 MED ORDER — HYDROMORPHONE HCL PF 2 MG/ML IJ SOLN
INTRAMUSCULAR | Status: AC
  Filled 2013-08-28: qty 1

## 2013-08-28 MED ORDER — MORPHINE SULFATE 2 MG/ML IJ SOLN
2.0000 mg | INTRAMUSCULAR | Status: DC
  Administered 2013-08-28 – 2013-08-29 (×7): 2 mg via INTRAVENOUS
  Filled 2013-08-28 (×7): qty 1

## 2013-08-28 NOTE — Consult Note (Signed)
I have reviewed and discussed the care of this patient in detail with the nurse practitioner including pertinent patient records, physical exam findings and data. I agree with details of this encounter.  

## 2013-08-28 NOTE — Progress Notes (Signed)
[facility] PROGRESS NOTE  [name] WUX:[id number] DOB: [birth year] DOA: 08/27/2013 PCP: Redge Gainer, MD  Brief narrative: 73 y.o. 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 She was evaluated at [facility] for BMT, but was not a candidate. She has been taken off Octreotide 06/2013 due to lack of response. She was hospitalized at [facility] and ultimately sent to a SNF for rehabilitation where she has been unable to participate in PT secondary to weakness and generalized pain. Patient complains of generalized pain, 10 out of 10 in intensity. Her power of attorney at the bedside has confirmed DO NOT RESUSCITATE CODE STATUS. At this time he wishes to proceed with comfort care and placement to residential hospice.  Assessment/Plan:   Principal Problem:  Palliative care encounter secondary to end stage amyloidosis with generalized weakness, failure to thrive  - Appreciate palliative care team recommendations. We will give 1 dose of Dilaudid 2 mg IV for better pain control. Per palliative care we'll switch to morphine IV. Once bed available in residential hospice we'll proceed with transfer. Active Problems:  Amyloidosis  - No further treatment options.  Focus on comfort care Protein-calorie malnutrition, severe  - eats very little; likely due to severe pain - Nutrition consulted  Generalized weakness  - Secondary to end stage amyloidosis, severe malnutrition/cachexia. - Even though physical therapy evaluation is appropriate the patient is not able to participate due to extreme pain. Chronic diarrhea  - Lomotil PRN.  DVT prophylaxis  - SCDs only. No Lovenox in light of comfort care.   Code Status: DNR  Family Communication: Delrae Rend [phone] cell, [phone] home), brother updated at bedside  today  Disposition Plan: Residential hospice.  Leisa Lenz, MD  [facility] Pager [phone]  If 7PM-7AM, please contact night-coverage [email or web address] Password [email or web address] 08/28/2013, 12:39 PM   LOS: 1 day   Consultants:  Palliative care  Procedures:  None  Antibiotics:  None  HPI/Subjective: Patient still in pain. Not adequately controlled with current analgesia.  Objective: Filed Vitals:   08/27/13 1433 08/27/13 2040 08/28/13 0605  BP: 146/70 125/63 125/64  Pulse: 82 74 80  Temp: 98.5 F (36.9 C) 98.9 F (37.2 C) 98 F (36.7 C)  TempSrc: Oral Oral Oral  Resp: 18 20 20   Height: 5\' 3"  (1.6 m)    Weight: 39.009 kg (86 lb)  38.1 kg (83 lb 15.9 oz)  SpO2: 100% 98% 98%    Intake/Output Summary (Last 24 hours) at 08/28/13 1239 Last data filed at 08/28/13 0900  Gross per 24 hour  Intake      0 ml  Output    100 ml  Net   -100 ml    Exam:   General:  Pt is alert, follows commands appropriately, in pain  Cardiovascular: Regular rate and rhythm, S1/S2 appreciated  Respiratory: Clear to auscultation bilaterally, no wheezing, no crackles, no rhonchi  Abdomen: Soft, non tender, non distended, bowel sounds present, no guarding; colostomy in place   Extremities: No edema, pulses DP and PT palpable bilaterally  Neuro: Grossly nonfocal  Data Reviewed: Basic Metabolic Panel: No results found for this basename: NA, K, CL, CO2, GLUCOSE, BUN, CREATININE, CALCIUM, MG, PHOS,  in the last 168 hours Liver Function Tests: No results found for this basename: AST, ALT, ALKPHOS, BILITOT, PROT, ALBUMIN,  in the last 168 hours No  results found for this basename: LIPASE, AMYLASE,  in the last 168 hours No results found for this basename: AMMONIA,  in the last 168 hours CBC: No results found for this basename: WBC, NEUTROABS, HGB, HCT, MCV, PLT,  in the last 168 hours Cardiac Enzymes: No results found for this basename: CKTOTAL, CKMB, CKMBINDEX, TROPONINI,  in the last 168  hours BNP: No components found with this basename: POCBNP,  CBG: No results found for this basename: GLUCAP,  in the last 168 hours  Recent Results (from the past 240 hour(s))  MRSA PCR SCREENING     Status: None   Collection Time    08/27/13  9:12 PM      Result Value Ref Range Status   MRSA by PCR NEGATIVE  NEGATIVE Final     Studies: No results found.  Scheduled Meds: . feeding supplement (RESOURCE BREEZE)  1 Container Oral TID WC  .  morphine injection  2 mg Intravenous Q4H  . sodium chloride  3 mL Intravenous Q12H

## 2013-08-28 NOTE — Care Management Note (Signed)
   CARE MANAGEMENT NOTE 08/28/2013  Patient:  [name], [name]   Account Number:  [id number]  Date Initiated:  08/28/2013  Documentation initiated by:  Kenadi Miltner  Subjective/Objective Assessment:   73 yo female admitted with failure to thrive. PTA pt from SNF.     Action/Plan:   Palliativve Care Team to consult   Anticipated DC Date:     Anticipated DC Plan:    In-house referral  Clinical Social Worker  Hospice / [place]  CM consult      Choice offered to / List presented to:  NA   DME arranged  NA      DME agency  NA     [place] arranged  NA      [place] agency  NA   Status of service:  In process, will continue to follow Medicare Important Message given?   (If response is "NO", the following Medicare IM given date fields will be blank) Date Medicare IM given:   Date Additional Medicare IM given:    Discharge Disposition:    Per UR Regulation:  Reviewed for med. necessity/level of care/duration of stay  If discussed at [place] of Stay Meetings, dates discussed:    Comments:  08/28/13 [place] [phone] Chart reviewed for utilization of services. Palliative Care Meeting scheduled 08/28/13 at 10am. Cm to continue to follow for possible dc needs.

## 2013-08-28 NOTE — Progress Notes (Signed)
Clinical Social Work Department BRIEF PSYCHOSOCIAL ASSESSMENT 08/28/2013  Patient:  [name], [name]     Account Number:  [id number]     Admit date:  08/27/2013  Clinical Social Worker:  Ulyess Blossom  Date/Time:  08/28/2013 11:30 AM  Referred by:  Physician  Date Referred:  08/28/2013 Referred for  Residential hospice placement   Other Referral:   Interview type:  Patient Other interview type:   and patient brother at bedside    PSYCHOSOCIAL DATA Living Status:  FACILITY Admitted from facility:   Level of care:  [place] Primary support name:  [name] Primary support relationship to patient:  [place] Degree of support available:   strong    CURRENT CONCERNS Current Concerns  Post-Acute Placement   Other Concerns:    SOCIAL WORK ASSESSMENT / PLAN CSW received referral for residential hospice placement.    CSW met with pt and pt brother at bedside. CSW introduced self and explained role. CSW discussed recommendation for residential hospice placement and offered choice. Pt brother discussed that pt had been in rehab in [place], [place] and pt brother had been in contact with [place] [place], but the SNF facility documentation did not support that pt was appropriate for residential hospice at that time. CSW provided emotional support to pt brother as he expressed his frustration with the SNF pt was residing at as they were continuing rehab with pt and pt very weak and unable to adequately participate. Pt brother discussed displeased feelings surrounding the fact that the SNF facility was providing [facility] with information that he did not feel was accurate as he felt that pt has been appropriate for residential hospice. CSW reassured pt brother that hospital documentation support the need for residential hospice placement.    Pt requested referral be made to [facility] in [place], [place] made referral to [facility] and received notification that [facility] has bed available on Wednesday 3/18.    CSW made referral to [place] and awaiting response regarding availability.    CSW notified pt brother of [place] [facility] bed available tomorrow and awaiting response from [place].    CSW to follow up with pt brother once response received from [place].    CSW to continue to follow and assist with residential hospice placement for pt.   Assessment/plan status:  Psychosocial Support/Ongoing Assessment of Needs Other assessment/ plan:   discharge planning   Information/referral to community resources:   Residential Hospice List    PATIENT'S/FAMILY'S RESPONSE TO PLAN OF CARE: Pt alert and oriented x 4. Pt appeared drowsy secondary to medication, but was able to express to this CSW that she wished to be comfortable and was agreeable to residential hospice. Pt brother appears supportive and actively involved in pt care.     Alison Murray, MSW, [place] Work [phone]

## 2013-08-28 NOTE — Progress Notes (Signed)
Chaplain consult in response to palliative care referral.  Pt alone at time of chaplain arrival.  Somewhat lethargic, but communicative.  Pt appreciative of chaplain visit and requested prayer.  Chaplain provided spiritual support and empathic presence, praying with pt.  Pt aware of conversation earlier in day with Palliative care.  Pt requests chaplain return for continued prayer and support and to be in contact with pt's brother, Ernie Hew, and nephew.   Spiritual care will continue to follow for pt and family support hospitalization.   [place], [place]

## 2013-08-28 NOTE — Consult Note (Signed)
Patient HK:[id number] MURPHY BUNDICK      DOB: 1941-04-24      DGL:[id number]     Consult Note from the Palliative Medicine Team at [facility] Requested by: Dr  Charlies Silvers    PCP: Redge Gainer, MD  Reason for Consultation:Clarification of [place] and options       Phone [phone]  Assessment of patients Current state:  [name] is an 73 y.o. 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 She was evaluated at [facility] for BMT, but was not a candidate. She has been taken off Octreotide 06/2013 due to lack of response. She was hospitalized at [facility] and ultimately sent to a SNF for rehabilitation where she has been unable to participate in PT secondary to weakness and generalized pain. The patient complains of pain from the tips of her toes to the tips of her fingers. She has had progressive weight loss, increasing fatigue and ongoing, chronic diarrhea but no black or bloody stools, no fever or chills and no nausea or vomiting. She feels miserable, is hardly drinking, and any oral intake of solid foods cause her to have uncontrolled diarrhea with bowel incontinence. She is severely malnourished and underweight with a BMI of 15.3. She has been essentially bedbound.  Faced with advanced directives and anticipatory care needs.     Consult is for review of medical treatment options, clarification of goals of care and end of life issues, disposition and options, and symptom recommendation.  This NP Wadie Lessen reviewed medical records, received report from team, assessed the patient and then meet at the patient's bedside along with her brother Delrae Rend to discuss diagnosis prognosis, [place], EOL wishes disposition and options.   A detailed discussion was had today regarding advanced directives.  Concepts  specific to code status, artifical feeding and hydration, continued IV antibiotics and rehospitalization was had.  The difference between a aggressive medical intervention path  and a palliative comfort care path for this patient at this time was had.  Values and goals of care important to patient and family were attempted to be elicited.  Concept of Hospice and Palliative Care were discussed  Natural trajectory and expectations at EOL were discussed.  Questions and concerns addressed.  Family encouraged to call with questions or concerns.  PMT will continue to support holistically.    Goals of Care: 1.  Code Status:DNR/DNI--Focus of care is comfort   2. Scope of Treatment: 1. Vital Signs:daily  2. Respiratory/Oxygen:for comfort only 3. Nutritional Support/Tube Feeds:no artificial feeding or hydration now or in the future 4. Antibiotics: none 5. Blood Products:none 6. JJO:ACZY 7. Review of Medications to be discontinued:minimize for comfort only 8. Labs:none 9. Telemetry:none    4. Disposition:   Hopeful for residential hospice.  With clear goals of comfort and very poor intake (sips only), prognosis is likely less than a few weeks   3. Symptom Management:   1. Anxiety/Agitation: Ativan 1 mg IV every 4 hrs prn 2. Pain/Dyspnea: Morphine 2 mg IV every 4 hrs scheduled/ Morphine 1 mg IV every 1 hr prn 3. Bowel Regimen:Dulcolax supp  4. Psychosocial:  Emotional support to patient and her brother at the bedside.  All hope is focused on comfort at this known time of End of Life  5. Spiritual: Chaplain consulted  Brief HPI: [name] is an 73 y.o. 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 She was evaluated at [facility] for BMT, but was not a candidate. She has been taken off Octreotide 06/2013  due to lack of response. She was hospitalized at [facility] and ultimately sent to a SNF for rehabilitation where she has been unable to participate in PT secondary to weakness and generalized pain. The patient complains of pain from the tips of her toes to the tips of her fingers. She has had progressive weight loss, increasing fatigue and ongoing, chronic diarrhea but no black or bloody stools, no fever or chills and no nausea or vomiting. She feels miserable, is hardly drinking, and any oral intake of solid foods cause her to have uncontrolled diarrhea with bowel incontinence. She is severely malnourished and underweight with a BMI of 15.3. She has been essentially bedbound. Overall failure to thrive, continued decline physically, functionally and cognitively Faced with advanced directives and anticipatory care needs.   ROS: Generalized pain, weakness and fatigue    PMH:  Past Medical History  Diagnosis Date  . History of CEA (carotid endarterectomy) 03/2007    Left CEA by Dr. Gae Gallop  . Hypertension   . Hyperlipidemia   . GERD (gastroesophageal reflux disease)   . Anemia in chronic kidney disease(285.21) 02/2009    She has been on Aranesp for ? anemia of chronic kidney disease though renal function does not seem severely impaired.  She had repeat bone marrow biopsy by Dr. Sonny Dandy 9/10.  Marland Kitchen Episode of syncope     dysauthonomia/ orthostatic syncope - Patient has [employer]onic loop recorder implanted 8/10.  No events so far - May be due only to orthostasis  . 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    . History of MRI 01/2009    CardiacMRI/EF 66%, mild to moderate concentric LVH, RV size & systolic function normAL w/o evidence for ARVC, trivial pericardial effusion/On delayed enhancement, it was difficult to obtain the TI/There was patchy mid-wall basal delayed enhancement/There was partially circumferential subendocardialdelayed enhancement in the mid to  apical LV/This was suggestive of infiltrative cardiomyopathy versus  . History of CT scan of chest 01/2009    to assess for sarcoidosis: no evidence by CT for pulmonary sarcoid  . 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   . Secretory diarrhea      treated with octreotide.   . Esophageal stricture 05/2010    with dilation  . Amyloidosis   . Shortness of breath     when tired  . Cancer 2010    Cancer of the Blood stream  . Chronic back pain 04/10/2013  . Seizure disorder 06/06/2013  . Bone pain 07/04/2013     PSH: Past Surgical History  Procedure Laterality Date  . Implantable loop recorder  2010    MDT for unexplained syncope  . Partial colectomy  1/11    for diverticular perforation  . Carotid endarterectomy  10/08    left  . Abdominal hysterectomy    . Upper gastrointestinal endoscopy  05/20/2010    EGD ED  . Colonoscopy  03/14/2009  . Ercp  04/09/2011    Procedure: ENDOSCOPIC RETROGRADE CHOLANGIOPANCREATOGRAPHY (ERCP);  Surgeon: Rogene Houston, MD;  Location: [facility] ORS;  Service: Endoscopy;  Laterality: N/A;  10:25  . Sphincterotomy  04/09/2011    Procedure: SPHINCTEROTOMY;  Surgeon: Rogene Houston, MD;  Location: [facility] ORS;  Service: Endoscopy;  Laterality: N/A;  with removal of multiple pigment stones  . Loop recorder explant  Feb. 18, 2014    removed 3 months ago. at [facility]  . Esophagogastroduodenoscopy (egd) with esophageal dilation N/A 12/19/2012    Procedure: ESOPHAGOGASTRODUODENOSCOPY (EGD) WITH ESOPHAGEAL DILATION;  Surgeon: Rogene Houston, MD;  Location: [facility] ENDO SUITE;  Service: Endoscopy;  Laterality: N/A;  1030   I have reviewed the FH and SH and  If appropriate update it with new information. No Known Allergies Scheduled Meds: . famotidine  20 mg Oral BID  . feeding supplement (RESOURCE BREEZE)  1 Container Oral TID WC  . megestrol  200 mg Oral Daily   . sodium chloride  3 mL Intravenous Q12H   Continuous Infusions:  PRN Meds:.sodium chloride, acetaminophen, acetaminophen, alum & mag hydroxide-simeth, diphenoxylate-atropine, HYDROmorphone (DILAUDID) injection, morphine, ondansetron (ZOFRAN) IV, ondansetron, sodium chloride, zolpidem    BP 125/64  Pulse 80  Temp(Src) 98 F (36.7 C) (Oral)  Resp 20  Ht _0  (1.6 m)  Wt 83 lb 15.9 oz (38.1 kg)  BMI 14.88 kg/m2  SpO2 98%   PPS: 20 % at best  No intake or output data in the 24 hours ending 08/28/13 0848   Physical Exam:  General: frail, cachetic, appears uncomfortable HEENT:  Mm, no exudate Chest:   Decreased in bases, CTA CVS: RRR Abdomen:noted colostomy site unremarkable, noted yellow stool Ext: without edema, with muscle atrophy Neuro: alert, mumbling, follows simple commands  Labs: CBC    Component Value Date/Time   WBC 5.1 07/04/2013 1153   WBC 3.5* 05/02/2013 0340   WBC 3.5 04/17/2013 1552   RBC 3.37* 07/04/2013 1153   RBC 2.77* 05/02/2013 0340   RBC 3.45* 04/17/2013 1552   HGB 9.8* 07/04/2013 1153   HGB 8.5* 05/02/2013 0340   HCT 30.1* 07/04/2013 1153   HCT 24.9* 05/02/2013 0340   PLT 223 07/04/2013 1153   PLT 118* 05/02/2013 0340   MCV 89.3 07/04/2013 1153   MCV 89.9 05/02/2013 0340   MCH 29.1 07/04/2013 1153   MCH 30.7 05/02/2013 0340   MCH 29.6 04/17/2013 1552   MCHC 32.6 07/04/2013 1153   MCHC 34.1 05/02/2013 0340   MCHC 32.3 04/17/2013 1552   RDW 12.7 07/04/2013 1153   RDW 13.0 05/02/2013 0340   RDW 14.2 04/17/2013 1552   LYMPHSABS 1.1 07/04/2013 1153   LYMPHSABS 0.8 04/30/2013 1830   LYMPHSABS 1.0 04/17/2013 1552   MONOABS 0.4 07/04/2013 1153   MONOABS 0.5 04/30/2013 1830   EOSABS 0.1 07/04/2013 1153   EOSABS 0.1 04/30/2013 1830   BASOSABS 0.0 07/04/2013 1153   BASOSABS 0.0 04/30/2013 1830   BASOSABS 0.0 04/17/2013 1552    BMET    Component Value Date/Time   NA 140 07/04/2013 1153   NA 138 05/02/2013 0340   NA 141 04/17/2013 1552   K 4.9 07/04/2013  1153   K 3.5 05/02/2013 0340   CL 106 05/02/2013 0340   CL 106 09/18/2012 1041   CO2 21* 07/04/2013 1153   CO2 23 05/02/2013 0340   GLUCOSE 106 07/04/2013 1153   GLUCOSE 119* 05/02/2013 0340   GLUCOSE 94 04/17/2013 1552   GLUCOSE 107* 09/18/2012 1041   BUN 20.7 07/04/2013 1153   BUN  9 05/02/2013 0340   BUN 15 04/17/2013 1552   CREATININE 1.2* 07/04/2013 1153   CREATININE 0.91 05/02/2013 0340   CALCIUM 10.3 07/04/2013 1153   CALCIUM 8.0* 05/02/2013 0340   GFRNONAA 62* 05/02/2013 0340   GFRAA 72* 05/02/2013 0340    CMP     Component Value Date/Time   NA 140 07/04/2013 1153   NA 138 05/02/2013 0340   NA 141 04/17/2013 1552   K 4.9 07/04/2013 1153   K 3.5 05/02/2013 0340   CL 106 05/02/2013 0340   CL 106 09/18/2012 1041   CO2 21* 07/04/2013 1153   CO2 23 05/02/2013 0340   GLUCOSE 106 07/04/2013 1153   GLUCOSE 119* 05/02/2013 0340   GLUCOSE 94 04/17/2013 1552   GLUCOSE 107* 09/18/2012 1041   BUN 20.7 07/04/2013 1153   BUN 9 05/02/2013 0340   BUN 15 04/17/2013 1552   CREATININE 1.2* 07/04/2013 1153   CREATININE 0.91 05/02/2013 0340   CALCIUM 10.3 07/04/2013 1153   CALCIUM 8.0* 05/02/2013 0340   PROT 7.1 07/04/2013 1153   PROT 7.0 04/30/2013 1830   PROT 6.7 04/17/2013 1552   ALBUMIN 3.8 07/04/2013 1153   ALBUMIN 4.2 04/30/2013 1830   AST 35* 07/04/2013 1153   AST 32 04/30/2013 1830   ALT 28 07/04/2013 1153   ALT 23 04/30/2013 1830   ALKPHOS 113 07/04/2013 1153   ALKPHOS 82 04/30/2013 1830   BILITOT 0.65 07/04/2013 1153   BILITOT 0.4 04/30/2013 1830   GFRNONAA 62* 05/02/2013 0340   GFRAA 72* 05/02/2013 0340      Time In Time Out Total Time Spent with Patient Total Overall Time  0930 1045 70 min 75 min    Greater than 50%  of this time was spent counseling and coordinating care related to the above assessment and plan.  Wadie Lessen NP  Palliative Medicine Team Team Phone # [phone] Pager [phone]  Discussed with Dr Charlies Silvers

## 2013-08-29 MED ORDER — MORPHINE SULFATE (CONCENTRATE) 20 MG/ML PO SOLN: 5.0000 mg | ORAL | Status: AC | PRN

## 2013-08-29 MED ORDER — BOOST / RESOURCE BREEZE PO LIQD: 1.0000 | Freq: Three times a day (TID) | ORAL | Status: AC

## 2013-08-29 MED ORDER — ALUM & MAG HYDROXIDE-SIMETH 200-200-20 MG/5ML PO SUSP: 30.0000 mL | Freq: Four times a day (QID) | ORAL | Status: AC | PRN

## 2013-08-29 MED ORDER — ACETAMINOPHEN 325 MG PO TABS: 650.0000 mg | ORAL_TABLET | Freq: Four times a day (QID) | ORAL | Status: AC | PRN

## 2013-08-29 MED ORDER — ONDANSETRON HCL 4 MG PO TABS: 4.0000 mg | ORAL_TABLET | Freq: Four times a day (QID) | ORAL | Status: AC | PRN

## 2013-08-29 NOTE — Progress Notes (Signed)
Pt. Was dishcarged to [facility]. Report was called to staff and per RN at facility they would like for PIV to be left in.

## 2013-08-29 NOTE — Discharge Instructions (Signed)
Hospice Care  °Hospice is a care service which can be used by people who are terminally ill and in whom healing is no longer thought possible. Hospice care is for people believed to have no more than 6 months to live. It is meant to help with the two largest fears near the end of life (the fears of dying and of being alone), as well as pain management, and an attempt to allow people to pass away comfortably at home.  °Hospice staff: °· Administer appropriate pain relief. °· Provide nursing care. °· Offer reassurance and support to loved ones and family members. °· Provide services to keep people comfortable at home or in a hospice facility. °Together, you can see to it that your loved one is not alone during this last and important phase of life. °You, your family, and your caregivers help you decide when hospice services should begin. If your condition improves or the disease goes into remission, you can be discharged from the hospice program. You can return to hospice care at a later time if needed. °The hospice philosophy recognizes death as the final stage of life. It helps patients continue an alert, pain-free life, and manage symptoms while surrounded by their loved ones. Hospice affirms life without hurrying death. Hospice care treats the person rather than the disease. It emphasizes quality of life with family-centered care. Hospice care involves the patient and family and helps them make decisions.  °The care is designed to: °· Relieve or decrease pain. °· Control other problems. °· Provide as much quality time as possible. °· Allow people to die with dignity. °Unlike other medical care, the focus is no longer on curing disease. The goal of hospice care is to offer as high a quality of life as possible during the end of life. In this way, the last days of life may be spent with dignity.  °With hospice care, instead of spending the last weeks or months in a hospital, a person is with loved ones in the home  or a homelike setting. About 90 percent of hospice care is provided at home. But hospice is available wherever a person lives, including a nursing home or assisted-living residence. Some residential hospices designed specifically for hospice care also exist. Hospice care is available for many types of terminal illnesses. °Hospice services are meant to serve both the patient and family members. °· Comfort. In most cases, the individual stays in his or her home or in homelike surroundings instead of in a hospital. The core of hospice is a cooperative effort by family, friends and a team of professional and volunteer caregivers working together to meet your loved one's needs. This team supplies all necessary medicines and equipment. It works with both the person involved and family members to relieve pain and symptoms. °· Support. Individuals enjoy the support of loved ones by receiving much of the basic care from family and friends. A nurse may lead the team and coordinates the day-to-day care. A doctor is also part of the team. Chaplains and social workers are available to counsel the family and their loved one. They make sure emotional, spiritual, and social needs are being met. Trained volunteers perform a wide variety of tasks as needed, such as: °· Providing companionship. °· Doing light housekeeping. °· Preparing meals. °· Running errands. °· Providing respite for the family. °· Improving quality of life. Caring for someone who is dying is emotionally and physically demanding. This can be particularly true for family members   who are primary caregivers. But you can take comfort in knowing that hospice is an act of love that can improve the quality of life for all involved. Professionals are often available to tend to the needs of grieving family members as well.  Spiritual Care. Hospice care emphasizes the spiritual needs of you and your family. People differ in their spiritual needs and religious beliefs so  spiritual care is individualized to meet the persons' and family's needs. It may include helping you to look at what death means to you, to say good-bye, or to perform a specific religious ceremony or ritual. HOW TO SELECT A PROGRAM Most hospice programs are run by nonprofit, independent organizations. Some are affiliated with hospitals, nursing homes or home health care agencies. Some are for-profit organizations. You can learn about existing hospice programs in your area from your health caregivers. ASK THE FOLLOWING:  What services are available to the patient?  What services are offered to the family?  Are bereavement services available?  How involved are the family members?  How involved is the doctor?  Who makes up the hospice care team? How are they trained or screened?  How will the individual's pain and symptoms be managed?  If circumstances change, can services be provided in different settings, such as the home or the hospital?  Is the program reviewed and licensed by the state or certified in some other way?  Are all costs covered by insurance? How much you pay for hospice care can vary greatly. It depends on the length and type of care necessary and your insurance coverage. Medicare and most private insurance plans, including managed care organizations, cover hospice care. Hospice is also covered by Medicaid in most states. Some hospice programs provide services on a sliding fee scale, based on your ability to pay. They may also provide some durable medical equipment for support within the home. Document Released: 09/17/2003 Document Revised: 08/23/2011 Document Reviewed: 05/31/2005 [facility] Patient Information 2014 [place], [place].

## 2013-08-29 NOTE — Progress Notes (Signed)
Pt for discharge to [facility].  CSW facilitated pt discharge needs including contacting facility, faxing pt discharge information via [facility], discussing with pt and pt brother at bedside, providing RN phone number to call report, and arranging ambulance transport for pt to [facility]. (Service Request ID#: [id number]).  No further social work needs identified at this time.  CSW signing off.   Alison Murray, MSW, [place] Work [phone]

## 2013-08-29 NOTE — Progress Notes (Signed)
CSW received return phone call from [facility] reporting that pt was appropriate for hospice home, but currently there is an extensive waiting list and no bed availability for likely 2 weeks.  CSW discussed with pt brother and pt brother is agreeable to transition to [facility] today.  CSW contacted [facility] and confirmed facility could accept pt today.  CSW notified MD.  CSW awaiting discharge information in order to facilitate pt discharge needs to [place], MSW, [place] Work [phone]

## 2013-08-29 NOTE — Discharge Summary (Signed)
Physician Discharge Summary  [name] [id number] DOB: [birth year] DOA: 08/27/2013  PCP: Redge Gainer, MD  Admit date: 08/27/2013 Discharge date: 08/29/2013  Recommendations for Outpatient Follow-up:  May increase/titrate  pain medication to comfort.  Discharge Diagnoses:  Principal Problem:   Palliative care encounter Active Problems:   Amyloidosis   Protein-calorie malnutrition, severe   Generalized weakness   FTT (failure to thrive) in adult   Chronic diarrhea   Pain, generalized   Weakness generalized    Discharge Condition: medically stable for discharge to residential hospice   Diet recommendation: as tolerated   History of present illness:  73 y.o. 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 She was evaluated at [facility] for BMT, but was not a candidate. She has been taken off Octreotide 06/2013 due to lack of response. She was hospitalized at [facility] and ultimately sent to a SNF for rehabilitation where she has been unable to participate in PT secondary to weakness and generalized pain. Patient complains of generalized pain, 10 out of 10 in intensity. Her power of attorney at the bedside has confirmed DO NOT RESUSCITATE CODE STATUS. At this time he wishes to proceed with comfort care and placement to residential hospice.   Assessment/Plan:   Principal Problem:  Palliative care encounter secondary to end stage amyloidosis with generalized weakness, failure to thrive  - Appreciate palliative care team recommendations. For pain control can use morphine solution as prescribed. - Pain much better this am Active Problems:  Amyloidosis  - No further treatment options. Focus on comfort care  Protein-calorie malnutrition, severe  - Eats very little; likely due to severe pain  - Nutrition  consulted  Generalized weakness  - Secondary to end stage amyloidosis, severe malnutrition/cachexia.  Chronic diarrhea  - Lomotil PRN.  DVT prophylaxis  - SCDs only. No Lovenox in light of comfort care.   Code Status: DNR  Family Communication: Delrae Rend [phone] cell, [phone] home), brother updated at bedside today  Disposition Plan: Residential hospice.   Consultants:  Palliative care Procedures:  None Antibiotics:  None  Signed:  Leisa Lenz, MD  [facility] 08/29/2013, 11:54 AM  Pager #: [phone]    Discharge Exam: Filed Vitals:   08/29/13 0613  BP: 105/63  Pulse: 81  Temp: 98.8 F (37.1 C)  Resp: 20   Filed Vitals:   08/27/13 2040 08/28/13 0605 08/28/13 2114 08/29/13 0613  BP: 125/63 125/64 111/61 105/63  Pulse: 74 80 84 81  Temp: 98.9 F (37.2 C) 98 F (36.7 C) 100.6 F (38.1 C) 98.8 F (37.1 C)  TempSrc: Oral Oral Oral Oral  Resp: 20 20 22 20   Height:      Weight:  38.1 kg (83 lb 15.9 oz)    SpO2: 98% 98% 100% 96%    General: Pt is alert, follows commands appropriately, not in acute distress Cardiovascular: Regular rate and rhythm, S1/S2 appreciated  Respiratory: Clear to auscultation bilaterally, no wheezing Abdominal: Soft, non tender, non distended, bowel sounds +, no guarding Extremities: no cyanosis, pulses palpable bilaterally DP and PT Neuro: Grossly nonfocal  Discharge Instructions  Discharge Orders   Future Appointments Provider Department Dept Phone   12/25/2013 9:30 AM Rogene Houston, MD [place] [phone]   Future Orders Complete By Expires   Call MD for:  difficulty breathing, headache  or visual disturbances  As directed    Call MD for:  persistant dizziness or light-headedness  As directed    Call MD for:  persistant nausea and vomiting  As directed    Call MD for:  severe uncontrolled pain  As directed    Diet - low sodium heart healthy  As directed    Discharge  instructions  As directed    Comments:     May increase/titrate  pain medication to comfort.   Increase activity slowly  As directed        Medication List    STOP taking these medications       aspirin EC 81 MG tablet     atorvastatin 20 MG tablet  Commonly known as:  LIPITOR     CALTRATE 600 PO     cholecalciferol 1000 UNITS tablet  Commonly known as:  VITAMIN D     CREON 24000 UNITS Cpep  Generic drug:  Pancrelipase (Lip-Prot-Amyl)     gabapentin 300 MG capsule  Commonly known as:  NEURONTIN     megestrol 400 MG/10ML suspension  Commonly known as:  MEGACE     metoprolol succinate 50 MG 24 hr tablet  Commonly known as:  TOPROL-XL     mirtazapine 15 MG tablet  Commonly known as:  REMERON     ranitidine 300 MG tablet  Commonly known as:  ZANTAC      TAKE these medications       acetaminophen 325 MG tablet  Commonly known as:  TYLENOL  Take 2 tablets (650 mg total) by mouth every 6 (six) hours as needed for mild pain (or Fever >/= 101).     alum & mag hydroxide-simeth 200-200-20 MG/5ML suspension  Commonly known as:  MAALOX/MYLANTA  Take 30 mLs by mouth every 6 (six) hours as needed for indigestion or heartburn (dyspepsia).     diphenoxylate-atropine 2.5-0.025 MG per tablet  Commonly known as:  LOMOTIL  Take 1 tablet by mouth 3 (three) times daily.     feeding supplement (RESOURCE BREEZE) Liqd  Take 1 Container by mouth 3 (three) times daily with meals.     morphine 20 MG/ML concentrated solution  Commonly known as:  ROXANOL  Take 0.25 mLs (5 mg total) by mouth every 2 (two) hours as needed for severe pain (Pain).     multivitamin with minerals Tabs tablet  Take 1 tablet by mouth daily.     ondansetron 4 MG tablet  Commonly known as:  ZOFRAN  Take 1 tablet (4 mg total) by mouth every 6 (six) hours as needed for nausea.     oxyCODONE-acetaminophen 5-325 MG per tablet  Commonly known as:  PERCOCET/ROXICET  Take 1 tablet by mouth every 4 (four) hours  as needed for severe pain.           Follow-up Information   Follow up with Redge Gainer, MD. (As needed)    Specialty:  Family Medicine   Contact information:   [address] [place] [address] [phone]        The results of significant diagnostics from this hospitalization (including imaging, microbiology, ancillary and laboratory) are listed below for reference.    Significant Diagnostic Studies: No results found.  Microbiology: MRSA PCR SCREENING     Status: None   Collection Time    08/27/13  9:12 PM      Result Value Ref Range Status   MRSA by PCR NEGATIVE  NEGATIVE Final  Labs: Basic Metabolic Panel: No results found for this basename: NA, K, CL, CO2, GLUCOSE, BUN, CREATININE, CALCIUM, MG, PHOS,  in the last 168 hours Liver Function Tests: No results found for this basename: AST, ALT, ALKPHOS, BILITOT, PROT, ALBUMIN,  in the last 168 hours No results found for this basename: LIPASE, AMYLASE,  in the last 168 hours No results found for this basename: AMMONIA,  in the last 168 hours CBC: No results found for this basename: WBC, NEUTROABS, HGB, HCT, MCV, PLT,  in the last 168 hours Cardiac Enzymes: No results found for this basename: CKTOTAL, CKMB, CKMBINDEX, TROPONINI,  in the last 168 hours BNP: BNP (last 3 results) No results found for this basename: PROBNP,  in the last 8760 hours CBG: No results found for this basename: GLUCAP,  in the last 168 hours  Time coordinating discharge: Over 30 minutes

## 2013-08-29 NOTE — Progress Notes (Signed)
Progress Note from the Palliative Medicine Team at [place]:  patient is awake. Lethargic and weak  -brother Ernie Hew at bedside, is grateful that the patient will dc today to an inpatient hospice facility   Objective: No Known Allergies Scheduled Meds: . feeding supplement (RESOURCE BREEZE)  1 Container Oral TID WC  .  morphine injection  2 mg Intravenous Q4H  . sodium chloride  3 mL Intravenous Q12H   Continuous Infusions:  PRN Meds:.sodium chloride, acetaminophen, acetaminophen, alum & mag hydroxide-simeth, diphenoxylate-atropine, LORazepam, morphine injection, ondansetron (ZOFRAN) IV, ondansetron, sodium chloride  BP 105/63  Pulse 81  Temp(Src) 98.8 F (37.1 C) (Oral)  Resp 20  Ht 5\' 3"  (1.6 m)  Wt 38.1 kg (83 lb 15.9 oz)  BMI 14.88 kg/m2  SpO2 96%   PPS: 30 % at best    Intake/Output Summary (Last 24 hours) at 08/29/13 1308 Last data filed at 08/29/13 0950  Gross per 24 hour  Intake     60 ml  Output      0 ml  Net     60 ml        Physical Exam:  General: frail, cachetic, appears comfortable, NAD HEENT: Mm, no exudate  Chest: Decreased in bases, CTA  CVS: RRR  Abdomen:noted colostomy site unremarkable, noted yellow stool  Ext: without edema, with muscle atrophy Skin: warm and dry  Neuro: alert, mumbling, follows simple commands      Labs: CBC    Component Value Date/Time   WBC 5.1 07/04/2013 1153   WBC 3.5* 05/02/2013 0340   WBC 3.5 04/17/2013 1552   RBC 3.37* 07/04/2013 1153   RBC 2.77* 05/02/2013 0340   RBC 3.45* 04/17/2013 1552   HGB 9.8* 07/04/2013 1153   HGB 8.5* 05/02/2013 0340   HCT 30.1* 07/04/2013 1153   HCT 24.9* 05/02/2013 0340   PLT 223 07/04/2013 1153   PLT 118* 05/02/2013 0340   MCV 89.3 07/04/2013 1153   MCV 89.9 05/02/2013 0340   MCH 29.1 07/04/2013 1153   MCH 30.7 05/02/2013 0340   MCH 29.6 04/17/2013 1552   MCHC 32.6 07/04/2013 1153   MCHC 34.1 05/02/2013 0340   MCHC 32.3 04/17/2013 1552   RDW 12.7 07/04/2013 1153   RDW 13.0 05/02/2013 0340   RDW 14.2 04/17/2013 1552   LYMPHSABS 1.1 07/04/2013 1153   LYMPHSABS 0.8 04/30/2013 1830   LYMPHSABS 1.0 04/17/2013 1552   MONOABS 0.4 07/04/2013 1153   MONOABS 0.5 04/30/2013 1830   EOSABS 0.1 07/04/2013 1153   EOSABS 0.1 04/30/2013 1830   BASOSABS 0.0 07/04/2013 1153   BASOSABS 0.0 04/30/2013 1830   BASOSABS 0.0 04/17/2013 1552    BMET    Component Value Date/Time   NA 140 07/04/2013 1153   NA 138 05/02/2013 0340   NA 141 04/17/2013 1552   K 4.9 07/04/2013 1153   K 3.5 05/02/2013 0340   CL 106 05/02/2013 0340   CL 106 09/18/2012 1041   CO2 21* 07/04/2013 1153   CO2 23 05/02/2013 0340   GLUCOSE 106 07/04/2013 1153   GLUCOSE 119* 05/02/2013 0340   GLUCOSE 94 04/17/2013 1552   GLUCOSE 107* 09/18/2012 1041   BUN 20.7 07/04/2013 1153   BUN 9 05/02/2013 0340   BUN 15 04/17/2013 1552   CREATININE 1.2* 07/04/2013 1153   CREATININE 0.91 05/02/2013 0340   CALCIUM 10.3 07/04/2013 1153   CALCIUM 8.0* 05/02/2013 0340   GFRNONAA 62* 05/02/2013 0340   GFRAA 72* 05/02/2013 0340  CMP     Component Value Date/Time   NA 140 07/04/2013 1153   NA 138 05/02/2013 0340   NA 141 04/17/2013 1552   K 4.9 07/04/2013 1153   K 3.5 05/02/2013 0340   CL 106 05/02/2013 0340   CL 106 09/18/2012 1041   CO2 21* 07/04/2013 1153   CO2 23 05/02/2013 0340   GLUCOSE 106 07/04/2013 1153   GLUCOSE 119* 05/02/2013 0340   GLUCOSE 94 04/17/2013 1552   GLUCOSE 107* 09/18/2012 1041   BUN 20.7 07/04/2013 1153   BUN 9 05/02/2013 0340   BUN 15 04/17/2013 1552   CREATININE 1.2* 07/04/2013 1153   CREATININE 0.91 05/02/2013 0340   CALCIUM 10.3 07/04/2013 1153   CALCIUM 8.0* 05/02/2013 0340   PROT 7.1 07/04/2013 1153   PROT 7.0 04/30/2013 1830   PROT 6.7 04/17/2013 1552   ALBUMIN 3.8 07/04/2013 1153   ALBUMIN 4.2 04/30/2013 1830   AST 35* 07/04/2013 1153   AST 32 04/30/2013 1830   ALT 28 07/04/2013 1153   ALT 23 04/30/2013 1830   ALKPHOS 113 07/04/2013 1153   ALKPHOS 82 04/30/2013 1830   BILITOT 0.65  07/04/2013 1153   BILITOT 0.4 04/30/2013 1830   GFRNONAA 62* 05/02/2013 0340   GFRAA 72* 05/02/2013 0340     Assessment and Plan: 1. Code Status: DNR/DNI-comfort is main focus of care  2. Symptom Control: 1. Anxiety/Agitation: Ativan 1 mg IV every 4 hrs prn 2. Pain/Dyspnea: Morphine 2 mg IV every 4 hrs scheduled/ Morphine 1 mg IV every 1 hr prn 3. Bowel Regimen:Dulcolax supp  3. Psycho/Social: Emotional support offered, both are  "at peace" understanding the limited prognosis and grateful for disposition "finally"   4. Disposition:  In patient hospice facility today  Patient Documents Completed or Given: Document Given Completed  Advanced Directives Pkt    MOST yes   DNR    Gone from [facility]    Hard Choices yes     Wadie Lessen NP  Palliative Medicine Team Team Phone # [phone] Pager [phone]   1

## 2013-09-03 ENCOUNTER — Other Ambulatory Visit: Payer: Self-pay | Admitting: Hematology and Oncology

## 2013-09-12 DEATH — deceased

## 2013-10-23 ENCOUNTER — Encounter: Payer: Self-pay | Admitting: Cardiology

## 2013-12-25 ENCOUNTER — Ambulatory Visit (INDEPENDENT_AMBULATORY_CARE_PROVIDER_SITE_OTHER): Payer: Medicare Other | Admitting: Internal Medicine

## 2014-05-23 ENCOUNTER — Encounter (HOSPITAL_COMMUNITY): Payer: Self-pay | Admitting: Internal Medicine

## 2016-07-20 ENCOUNTER — Encounter: Payer: Self-pay | Admitting: Internal Medicine
